# Patient Record
Sex: Female | Born: 1945 | Race: White | Hispanic: No | Marital: Single | Smoking: Never smoker
Health system: Southern US, Community
[De-identification: ages and names within clinical notes are randomized; demographics above are authoritative.]

## PROBLEM LIST (undated history)

## (undated) DIAGNOSIS — K635 Polyp of colon: Secondary | ICD-10-CM

## (undated) DIAGNOSIS — E785 Hyperlipidemia, unspecified: Secondary | ICD-10-CM

## (undated) DIAGNOSIS — F419 Anxiety disorder, unspecified: Secondary | ICD-10-CM

## (undated) DIAGNOSIS — T7840XA Allergy, unspecified, initial encounter: Secondary | ICD-10-CM

## (undated) DIAGNOSIS — J189 Pneumonia, unspecified organism: Secondary | ICD-10-CM

## (undated) DIAGNOSIS — Z87442 Personal history of urinary calculi: Secondary | ICD-10-CM

## (undated) DIAGNOSIS — T8859XA Other complications of anesthesia, initial encounter: Secondary | ICD-10-CM

## (undated) DIAGNOSIS — I10 Essential (primary) hypertension: Secondary | ICD-10-CM

## (undated) DIAGNOSIS — J45909 Unspecified asthma, uncomplicated: Secondary | ICD-10-CM

## (undated) DIAGNOSIS — M1990 Unspecified osteoarthritis, unspecified site: Secondary | ICD-10-CM

## (undated) DIAGNOSIS — T4145XA Adverse effect of unspecified anesthetic, initial encounter: Secondary | ICD-10-CM

## (undated) DIAGNOSIS — Z96611 Presence of right artificial shoulder joint: Secondary | ICD-10-CM

## (undated) HISTORY — DX: Allergy, unspecified, initial encounter: T78.40XA

## (undated) HISTORY — DX: Polyp of colon: K63.5

## (undated) HISTORY — DX: Presence of right artificial shoulder joint: Z96.611

## (undated) HISTORY — PX: CATARACT EXTRACTION: SUR2

## (undated) HISTORY — DX: Hyperlipidemia, unspecified: E78.5

## (undated) HISTORY — PX: EYE SURGERY: SHX253

## (undated) HISTORY — PX: TONSILLECTOMY: SUR1361

## (undated) HISTORY — PX: KIDNEY STONE SURGERY: SHX686

---

## 2012-08-25 ENCOUNTER — Other Ambulatory Visit: Payer: Self-pay | Admitting: Family Medicine

## 2012-08-25 DIAGNOSIS — Z1231 Encounter for screening mammogram for malignant neoplasm of breast: Secondary | ICD-10-CM

## 2012-09-19 ENCOUNTER — Ambulatory Visit: Payer: Self-pay

## 2012-10-15 ENCOUNTER — Ambulatory Visit
Admission: RE | Admit: 2012-10-15 | Discharge: 2012-10-15 | Disposition: A | Discharge status: Home / Self Care | Payer: Medicare Other | Source: Ambulatory Visit | Attending: Family Medicine | Admitting: Family Medicine

## 2012-10-15 DIAGNOSIS — Z1231 Encounter for screening mammogram for malignant neoplasm of breast: Secondary | ICD-10-CM

## 2013-10-16 ENCOUNTER — Other Ambulatory Visit: Payer: Self-pay

## 2013-10-16 ENCOUNTER — Other Ambulatory Visit: Payer: Self-pay | Admitting: Family Medicine

## 2013-10-16 DIAGNOSIS — E2839 Other primary ovarian failure: Secondary | ICD-10-CM

## 2013-10-16 DIAGNOSIS — Z1231 Encounter for screening mammogram for malignant neoplasm of breast: Secondary | ICD-10-CM

## 2013-11-05 ENCOUNTER — Ambulatory Visit
Admission: RE | Admit: 2013-11-05 | Discharge: 2013-11-05 | Disposition: A | Discharge status: Home / Self Care | Payer: Medicare Other | Source: Ambulatory Visit

## 2013-11-05 ENCOUNTER — Ambulatory Visit
Admission: RE | Admit: 2013-11-05 | Discharge: 2013-11-05 | Disposition: A | Discharge status: Home / Self Care | Payer: Medicare Other | Source: Ambulatory Visit | Attending: Family Medicine | Admitting: Family Medicine

## 2013-11-05 DIAGNOSIS — E2839 Other primary ovarian failure: Secondary | ICD-10-CM

## 2013-11-05 DIAGNOSIS — Z1231 Encounter for screening mammogram for malignant neoplasm of breast: Secondary | ICD-10-CM

## 2013-12-02 ENCOUNTER — Encounter: Payer: Self-pay | Admitting: Family Medicine

## 2013-12-02 LAB — HM COLONOSCOPY

## 2014-11-02 ENCOUNTER — Encounter: Payer: Self-pay | Admitting: *Deleted

## 2014-11-03 ENCOUNTER — Ambulatory Visit (INDEPENDENT_AMBULATORY_CARE_PROVIDER_SITE_OTHER): Payer: Medicare Other | Admitting: *Deleted

## 2014-11-03 DIAGNOSIS — I8393 Asymptomatic varicose veins of bilateral lower extremities: Secondary | ICD-10-CM

## 2014-11-03 DIAGNOSIS — I781 Nevus, non-neoplastic: Secondary | ICD-10-CM

## 2014-11-03 NOTE — Progress Notes (Signed)
X=.3% Sotradecol administered with a 27g butterfly.  Patient received a total of 6cc.  Treated all her areas of concern with one syringe. Easy access and tol well. Anticipate good results. Follow prn.  Photos: No.  Compression stockings applied: Yes.

## 2014-11-04 ENCOUNTER — Encounter: Payer: Self-pay | Admitting: *Deleted

## 2014-11-16 ENCOUNTER — Other Ambulatory Visit: Payer: Self-pay

## 2014-11-16 DIAGNOSIS — Z1231 Encounter for screening mammogram for malignant neoplasm of breast: Secondary | ICD-10-CM

## 2014-12-14 ENCOUNTER — Ambulatory Visit: Payer: Medicare Other

## 2014-12-15 ENCOUNTER — Ambulatory Visit
Admission: RE | Admit: 2014-12-15 | Discharge: 2014-12-15 | Disposition: A | Discharge status: Home / Self Care | Payer: Medicare Other | Source: Ambulatory Visit

## 2014-12-15 DIAGNOSIS — Z1231 Encounter for screening mammogram for malignant neoplasm of breast: Secondary | ICD-10-CM

## 2015-11-14 ENCOUNTER — Other Ambulatory Visit: Payer: Self-pay | Admitting: Family Medicine

## 2015-11-14 DIAGNOSIS — Z1231 Encounter for screening mammogram for malignant neoplasm of breast: Secondary | ICD-10-CM

## 2015-11-14 DIAGNOSIS — M8580 Other specified disorders of bone density and structure, unspecified site: Secondary | ICD-10-CM

## 2016-01-09 ENCOUNTER — Ambulatory Visit
Admission: RE | Admit: 2016-01-09 | Discharge: 2016-01-09 | Disposition: A | Discharge status: Home / Self Care | Payer: Medicare Other | Source: Ambulatory Visit | Attending: Family Medicine | Admitting: Family Medicine

## 2016-01-09 DIAGNOSIS — Z1231 Encounter for screening mammogram for malignant neoplasm of breast: Secondary | ICD-10-CM

## 2016-01-09 DIAGNOSIS — M8580 Other specified disorders of bone density and structure, unspecified site: Secondary | ICD-10-CM

## 2016-12-18 ENCOUNTER — Other Ambulatory Visit: Payer: Self-pay | Admitting: Family Medicine

## 2016-12-18 DIAGNOSIS — Z1231 Encounter for screening mammogram for malignant neoplasm of breast: Secondary | ICD-10-CM

## 2017-02-08 ENCOUNTER — Ambulatory Visit: Payer: Medicare Other

## 2017-02-14 ENCOUNTER — Ambulatory Visit
Admission: RE | Admit: 2017-02-14 | Discharge: 2017-02-14 | Disposition: A | Discharge status: Home / Self Care | Payer: Medicare Other | Source: Ambulatory Visit | Attending: Family Medicine | Admitting: Family Medicine

## 2017-02-14 DIAGNOSIS — Z1231 Encounter for screening mammogram for malignant neoplasm of breast: Secondary | ICD-10-CM

## 2017-03-28 ENCOUNTER — Other Ambulatory Visit: Payer: Self-pay | Admitting: Orthopedic Surgery

## 2017-03-28 DIAGNOSIS — M19012 Primary osteoarthritis, left shoulder: Secondary | ICD-10-CM

## 2017-04-09 LAB — HM PAP SMEAR: HM PAP: NEGATIVE

## 2017-04-10 ENCOUNTER — Other Ambulatory Visit: Payer: Self-pay | Admitting: Orthopedic Surgery

## 2017-04-10 ENCOUNTER — Ambulatory Visit
Admission: RE | Admit: 2017-04-10 | Discharge: 2017-04-10 | Disposition: A | Discharge status: Home / Self Care | Payer: Medicare Other | Source: Ambulatory Visit | Attending: Orthopedic Surgery | Admitting: Orthopedic Surgery

## 2017-04-10 DIAGNOSIS — M19012 Primary osteoarthritis, left shoulder: Secondary | ICD-10-CM

## 2017-05-15 ENCOUNTER — Other Ambulatory Visit: Payer: Self-pay | Admitting: Orthopedic Surgery

## 2017-05-28 LAB — LIPID PANEL
CHOLESTEROL: 252 — AB (ref 0–200)
HDL: 72 — AB (ref 35–70)
LDL Cholesterol: 145
Triglycerides: 174 — AB (ref 40–160)

## 2017-05-28 LAB — CBC AND DIFFERENTIAL
NEUTROS ABS: 5
WBC: 7.5

## 2017-05-28 LAB — BASIC METABOLIC PANEL
BUN: 18 (ref 4–21)
Creatinine: 0.6 (ref 0.5–1.1)
Glucose: 97
Sodium: 137 (ref 137–147)

## 2017-05-28 LAB — HEPATIC FUNCTION PANEL: BILIRUBIN, TOTAL: 0.6

## 2017-06-07 ENCOUNTER — Other Ambulatory Visit: Payer: Self-pay | Admitting: Cardiology

## 2017-06-07 DIAGNOSIS — I700 Atherosclerosis of aorta: Secondary | ICD-10-CM

## 2017-06-10 NOTE — Pre-Procedure Instructions (Signed)
Ashley Dyer  06/10/2017      [id number] - [place], [place] - [address] [place] [place] [address] Phone: [phone] Fax: [phone]    Your procedure is scheduled on Thursday 06/13/17  Report to [facility] Admitting at 530 A.M.  Call this number if you have problems the morning of surgery:  [phone]   Remember:  Do not eat food or drink liquids after midnight.  Take these medicines the morning of surgery with A SIP OF WATER - ALBUTEROL INHALER IF NEEDED  7 days prior to surgery STOP taking any Aspirin (unless otherwise instructed by your surgeon), Aleve, Naproxen, Ibuprofen, Motrin, Advil, Goody's, BC's, all herbal medications, fish oil, and all vitamins, TURMERIC   Do not wear jewelry, make-up or nail polish.  Do not wear lotions, powders, or perfumes, or deoderant.  Do not shave 48 hours prior to surgery.  Men may shave face and neck.  Do not bring valuables to the hospital.  [facility] is not responsible for any belongings or valuables.  Contacts, dentures or bridgework may not be worn into surgery.  Leave your suitcase in the car.  After surgery it may be brought to your room.  For patients admitted to the hospital, discharge time will be determined by your treatment team.  Patients discharged the day of surgery will not be allowed to drive home.   Name and phone number of your driver:    Special instructions:  [place] - Preparing for Surgery  Before surgery, you can play an important role.  Because skin is not sterile, your skin needs to be as free of germs as possible.  You can reduce the number of germs on you skin by washing with CHG (chlorahexidine gluconate) soap before surgery.  CHG is an antiseptic cleaner which kills germs and bonds with the skin to continue killing germs even after washing.  Please DO NOT use if you have an allergy to CHG or antibacterial soaps.  If your skin becomes reddened/irritated  stop using the CHG and inform your nurse when you arrive at Short Stay.  Do not shave (including legs and underarms) for at least 48 hours prior to the first CHG shower.  You may shave your face.  Please follow these instructions carefully:   1.  Shower with CHG Soap the night before surgery and the                                morning of Surgery.  2.  If you choose to wash your hair, wash your hair first as usual with your       normal shampoo.  3.  After you shampoo, rinse your hair and body thoroughly to remove the                      Shampoo.  4.  Use CHG as you would any other liquid soap.  You can apply chg directly       to the skin and wash gently with scrungie or a clean washcloth.  5.  Apply the CHG Soap to your body ONLY FROM THE NECK DOWN.        Do not use on open wounds or open sores.  Avoid contact with your eyes,       ears, mouth and genitals (private parts).  Wash genitals (private parts)  with your normal soap.  6.  Wash thoroughly, paying special attention to the area where your surgery        will be performed.  7.  Thoroughly rinse your body with warm water from the neck down.  8.  DO NOT shower/wash with your normal soap after using and rinsing off       the CHG Soap.  9.  Pat yourself dry with a clean towel.            10.  Wear clean pajamas.            11.  Place clean sheets on your bed the night of your first shower and do not        sleep with pets.  Day of Surgery  Do not apply any lotions/deoderants the morning of surgery.  Please wear clean clothes to the hospital/surgery center.    Please read over the following fact sheets that you were given. MRSA Information and Surgical Site Infection Prevention

## 2017-06-11 ENCOUNTER — Inpatient Hospital Stay (HOSPITAL_COMMUNITY)
Admission: RE | Admit: 2017-06-11 | Discharge: 2017-06-11 | Disposition: A | Discharge status: Home / Self Care | Payer: Medicare Other | Source: Ambulatory Visit

## 2017-06-12 ENCOUNTER — Other Ambulatory Visit: Payer: Self-pay

## 2017-06-12 ENCOUNTER — Encounter (HOSPITAL_COMMUNITY)
Admission: RE | Admit: 2017-06-12 | Discharge: 2017-06-12 | Disposition: A | Discharge status: Home / Self Care | Payer: Medicare Other | Source: Ambulatory Visit | Attending: Orthopedic Surgery | Admitting: Orthopedic Surgery

## 2017-06-12 ENCOUNTER — Encounter (HOSPITAL_COMMUNITY): Payer: Self-pay

## 2017-06-12 DIAGNOSIS — Z01818 Encounter for other preprocedural examination: Secondary | ICD-10-CM

## 2017-06-12 HISTORY — DX: Unspecified asthma, uncomplicated: J45.909

## 2017-06-12 HISTORY — DX: Personal history of urinary calculi: Z87.442

## 2017-06-12 HISTORY — DX: Adverse effect of unspecified anesthetic, initial encounter: T41.45XA

## 2017-06-12 HISTORY — DX: Anxiety disorder, unspecified: F41.9

## 2017-06-12 HISTORY — DX: Unspecified osteoarthritis, unspecified site: M19.90

## 2017-06-12 HISTORY — DX: Essential (primary) hypertension: I10

## 2017-06-12 HISTORY — DX: Other complications of anesthesia, initial encounter: T88.59XA

## 2017-06-12 LAB — COMPREHENSIVE METABOLIC PANEL
ALK PHOS: 84 U/L (ref 38–126)
ALT: 24 U/L (ref 14–54)
ANION GAP: 11 (ref 5–15)
AST: 30 U/L (ref 15–41)
Albumin: 4.3 g/dL (ref 3.5–5.0)
BUN: 17 mg/dL (ref 6–20)
CALCIUM: 9.6 mg/dL (ref 8.9–10.3)
CO2: 25 mmol/L (ref 22–32)
CREATININE: 0.67 mg/dL (ref 0.44–1.00)
Chloride: 101 mmol/L (ref 101–111)
Glucose, Bld: 86 mg/dL (ref 65–99)
Potassium: 3 mmol/L — ABNORMAL LOW (ref 3.5–5.1)
SODIUM: 137 mmol/L (ref 135–145)
TOTAL PROTEIN: 8.3 g/dL — AB (ref 6.5–8.1)
Total Bilirubin: 0.5 mg/dL (ref 0.3–1.2)

## 2017-06-12 LAB — URINALYSIS, ROUTINE W REFLEX MICROSCOPIC
BACTERIA UA: NONE SEEN
Bilirubin Urine: NEGATIVE
Glucose, UA: NEGATIVE mg/dL
Ketones, ur: NEGATIVE mg/dL
Leukocytes, UA: NEGATIVE
Nitrite: NEGATIVE
PROTEIN: NEGATIVE mg/dL
SPECIFIC GRAVITY, URINE: 1.005 (ref 1.005–1.030)
SQUAMOUS EPITHELIAL / LPF: NONE SEEN
pH: 8 (ref 5.0–8.0)

## 2017-06-12 LAB — CBC WITH DIFFERENTIAL/PLATELET
Basophils Absolute: 0 10*3/uL (ref 0.0–0.1)
Basophils Relative: 0 %
EOS ABS: 0 10*3/uL (ref 0.0–0.7)
EOS PCT: 0 %
HCT: 42.2 % (ref 36.0–46.0)
HEMOGLOBIN: 14.4 g/dL (ref 12.0–15.0)
LYMPHS ABS: 1.8 10*3/uL (ref 0.7–4.0)
LYMPHS PCT: 12 %
MCH: 31.2 pg (ref 26.0–34.0)
MCHC: 34.1 g/dL (ref 30.0–36.0)
MCV: 91.5 fL (ref 78.0–100.0)
MONOS PCT: 8 %
Monocytes Absolute: 1.2 10*3/uL — ABNORMAL HIGH (ref 0.1–1.0)
NEUTROS PCT: 80 %
Neutro Abs: 11.5 10*3/uL — ABNORMAL HIGH (ref 1.7–7.7)
Platelets: 352 10*3/uL (ref 150–400)
RBC: 4.61 MIL/uL (ref 3.87–5.11)
RDW: 13.2 % (ref 11.5–15.5)
WBC: 14.5 10*3/uL — ABNORMAL HIGH (ref 4.0–10.5)

## 2017-06-12 LAB — PROTIME-INR
INR: 0.96
PROTHROMBIN TIME: 12.7 s (ref 11.4–15.2)

## 2017-06-12 LAB — TYPE AND SCREEN
ABO/RH(D): O POS
Antibody Screen: NEGATIVE

## 2017-06-12 LAB — ABO/RH: ABO/RH(D): O POS

## 2017-06-12 LAB — SURGICAL PCR SCREEN
MRSA, PCR: NEGATIVE
STAPHYLOCOCCUS AUREUS: NEGATIVE

## 2017-06-12 LAB — APTT: aPTT: 33 seconds (ref 24–36)

## 2017-06-12 NOTE — Pre-Procedure Instructions (Signed)
ADORIA KAWAMOTO  06/12/2017      [facility] - [name], [place] - [address] [name]  [address] Phone: [phone] Fax: [phone]    Your procedure is scheduled on Thursday 06/13/17  Report to [facility] Admitting at 530 A.M.  Call this number if you have problems the morning of surgery:  [phone]   Remember:  Do not eat food or drink liquids after midnight.  Take these medicines the morning of surgery with A SIP OF WATER - ALBUTEROL INHALER IF NEEDED, use Flovent inhaler, Nasalcrom, Prednisone & Azithromycin  7 days prior to surgery STOP taking any Aspirin (unless otherwise instructed by your surgeon), Aleve, Naproxen, Ibuprofen, Motrin, Advil, Goody's, BC's, all herbal medications, fish oil, and all vitamins, TURMERIC   Do not wear jewelry, make-up or nail polish.   Do not wear lotions, powders, or perfumes, or deoderant.   Do not shave 48 hours prior to surgery.    Do not bring valuables to the hospital.   [facility] is not responsible for any belongings or valuables.  Contacts, dentures or bridgework may not be worn into surgery.  Leave your suitcase in the car.  After surgery it may be brought to your room.  For patients admitted to the hospital, discharge time will be determined by your treatment team.  Patients discharged the day of surgery will not be allowed to drive home.   Name and phone number of your driver:    Special instructions:  [place] - Preparing for Surgery  Before surgery, you can play an important role.  Because skin is not sterile, your skin needs to be as free of germs as possible.  You can reduce the number of germs on you skin by washing with CHG (chlorahexidine gluconate) soap before surgery.  CHG is an antiseptic cleaner which kills germs and bonds with the skin to continue killing germs even after washing.  Please DO NOT use if you have an allergy to CHG or antibacterial soaps.   If your skin becomes reddened/irritated stop using the CHG and inform your nurse when you arrive at Short Stay.  Do not shave (including legs and underarms) for at least 48 hours prior to the first CHG shower.  You may shave your face.  Please follow these instructions carefully:   1.  Shower with CHG Soap the night before surgery and the                                morning of Surgery.  2.  If you choose to wash your hair, wash your hair first as usual with your       normal shampoo.  3.  After you shampoo, rinse your hair and body thoroughly to remove the                      Shampoo.  4.  Use CHG as you would any other liquid soap.  You can apply chg directly       to the skin and wash gently with scrungie or a clean washcloth.  5.  Apply the CHG Soap to your body ONLY FROM THE NECK DOWN.        Do not use on open wounds or open sores.  Avoid contact with your eyes,       ears, mouth and genitals (private parts).  Wash genitals (private parts)       with your normal soap.  6.  Wash thoroughly, paying special attention to the area where your surgery        will be performed.  7.  Thoroughly rinse your body with warm water from the neck down.  8.  DO NOT shower/wash with your normal soap after using and rinsing off       the CHG Soap.  9.  Pat yourself dry with a clean towel.            10.  Wear clean pajamas.            11.  Place clean sheets on your bed the night of your first shower and do not        sleep with pets.  Day of Surgery  Do not apply any lotions/deoderants the morning of surgery.  Please wear clean clothes to the hospital/surgery center.    Please read over the following fact sheets that you were given. MRSA Information and Surgical Site Infection Prevention

## 2017-06-12 NOTE — Pre-Procedure Instructions (Signed)
Ashley Dyer  06/12/2017      [facility] - [place], [name] - [address] [place] [address] Phone: [phone] Fax: [phone]    Your procedure is scheduled on Thursday 06/13/17  Report to [facility] Admitting at 530 A.M.  Call this number if you have problems the morning of surgery:  [place] with you the day of surgery   Remember:  Do not eat food or drink liquids after midnight.  Take these medicines the morning of surgery with A SIP OF WATER - ALBUTEROL INHALER IF NEEDED, use Flovent inhaler, Nasalcrom, Prednisone & Azithromycin  7 days prior to surgery STOP taking any Aspirin (unless otherwise instructed by your surgeon), Aleve, Naproxen, Ibuprofen, Motrin, Advil, Goody's, BC's, all herbal medications, fish oil, and all vitamins, TURMERIC   Do not wear jewelry, make-up or nail polish.   Do not wear lotions, powders, or perfumes, or deoderant.   Do not shave 48 hours prior to surgery.    Do not bring valuables to the hospital.   [facility] is not responsible for any belongings or valuables.  Contacts, dentures or bridgework may not be worn into surgery.  Leave your suitcase in the car.  After surgery it may be brought to your room.  For patients admitted to the hospital, discharge time will be determined by your treatment team.  Patients discharged the day of surgery will not be allowed to drive home.   Name and phone number of your driver:    Special instructions:  [place] - Preparing for Surgery  Before surgery, you can play an important role.  Because skin is not sterile, your skin needs to be as free of germs as possible.  You can reduce the number of germs on you skin by washing with CHG (chlorahexidine gluconate) soap before surgery.  CHG is an antiseptic cleaner which kills germs and bonds with the skin to continue killing germs even after washing.  Please DO NOT  use if you have an allergy to CHG or antibacterial soaps.  If your skin becomes reddened/irritated stop using the CHG and inform your nurse when you arrive at Short Stay.  Do not shave (including legs and underarms) for at least 48 hours prior to the first CHG shower.  You may shave your face.  Please follow these instructions carefully:   1.  Shower with CHG Soap the night before surgery and the                                morning of Surgery.  2.  If you choose to wash your hair, wash your hair first as usual with your       normal shampoo.  3.  After you shampoo, rinse your hair and body thoroughly to remove the                      Shampoo.  4.  Use CHG as you would any other liquid soap.  You can apply chg directly       to the skin and wash gently with scrungie or a clean washcloth.  5.  Apply the CHG Soap to your body ONLY FROM THE NECK DOWN.        Do not use on open wounds or open sores.  Avoid  contact with your eyes,       ears, mouth and genitals (private parts).  Wash genitals (private parts)       with your normal soap.  6.  Wash thoroughly, paying special attention to the area where your surgery        will be performed.  7.  Thoroughly rinse your body with warm water from the neck down.  8.  DO NOT shower/wash with your normal soap after using and rinsing off       the CHG Soap.  9.  Pat yourself dry with a clean towel.            10.  Wear clean pajamas.            11.  Place clean sheets on your bed the night of your first shower and do not        sleep with pets.  Day of Surgery  Do not apply any lotions/deoderants the morning of surgery.  Please wear clean clothes to the hospital/surgery center.    Please read over the following fact sheets that you were given. MRSA Information and Surgical Site Infection Prevention

## 2017-06-12 NOTE — Progress Notes (Signed)
Pt. Recently seen by Dr. Wynonia Lawman for evaluation after PCP- Edwin Dada was questioning something on her EKG.  Requesting records from Dr. Wynonia Lawman. Pt. Remarks that a cardiac scan will be done after she has the shoulder surgery. Pt. Recently & currently being treated for bronchitis, using Zpak & Pred. Pak.

## 2017-06-13 ENCOUNTER — Inpatient Hospital Stay (HOSPITAL_COMMUNITY): Payer: Medicare Other

## 2017-06-13 ENCOUNTER — Inpatient Hospital Stay (HOSPITAL_COMMUNITY)
Admission: RE | Admit: 2017-06-13 | Discharge: 2017-06-14 | DRG: 483 | Disposition: A | Discharge status: Home / Self Care | Payer: Medicare Other | Source: Ambulatory Visit | Attending: Orthopedic Surgery | Admitting: Orthopedic Surgery

## 2017-06-13 ENCOUNTER — Encounter (HOSPITAL_COMMUNITY)
Admission: RE | Disposition: A | Discharge status: Home / Self Care | Payer: Self-pay | Source: Ambulatory Visit | Attending: Orthopedic Surgery

## 2017-06-13 ENCOUNTER — Other Ambulatory Visit: Payer: Self-pay

## 2017-06-13 ENCOUNTER — Inpatient Hospital Stay (HOSPITAL_COMMUNITY): Payer: Medicare Other | Admitting: Certified Registered Nurse Anesthetist

## 2017-06-13 ENCOUNTER — Encounter (HOSPITAL_COMMUNITY): Payer: Self-pay | Admitting: *Deleted

## 2017-06-13 DIAGNOSIS — M19042 Primary osteoarthritis, left hand: Secondary | ICD-10-CM | POA: Diagnosis present

## 2017-06-13 DIAGNOSIS — I10 Essential (primary) hypertension: Secondary | ICD-10-CM | POA: Diagnosis present

## 2017-06-13 DIAGNOSIS — Z96611 Presence of right artificial shoulder joint: Secondary | ICD-10-CM

## 2017-06-13 DIAGNOSIS — Z87891 Personal history of nicotine dependence: Secondary | ICD-10-CM

## 2017-06-13 DIAGNOSIS — Z7952 Long term (current) use of systemic steroids: Secondary | ICD-10-CM | POA: Diagnosis not present

## 2017-06-13 DIAGNOSIS — J45909 Unspecified asthma, uncomplicated: Secondary | ICD-10-CM | POA: Diagnosis present

## 2017-06-13 DIAGNOSIS — M19041 Primary osteoarthritis, right hand: Secondary | ICD-10-CM | POA: Diagnosis present

## 2017-06-13 DIAGNOSIS — Z87442 Personal history of urinary calculi: Secondary | ICD-10-CM

## 2017-06-13 DIAGNOSIS — Z7951 Long term (current) use of inhaled steroids: Secondary | ICD-10-CM

## 2017-06-13 DIAGNOSIS — M19011 Primary osteoarthritis, right shoulder: Secondary | ICD-10-CM | POA: Diagnosis present

## 2017-06-13 DIAGNOSIS — M19012 Primary osteoarthritis, left shoulder: Secondary | ICD-10-CM | POA: Diagnosis present

## 2017-06-13 DIAGNOSIS — J439 Emphysema, unspecified: Secondary | ICD-10-CM | POA: Diagnosis present

## 2017-06-13 DIAGNOSIS — I700 Atherosclerosis of aorta: Secondary | ICD-10-CM | POA: Diagnosis present

## 2017-06-13 HISTORY — PX: TOTAL SHOULDER ARTHROPLASTY: SHX126

## 2017-06-13 HISTORY — DX: Presence of right artificial shoulder joint: Z96.611

## 2017-06-13 SURGERY — ARTHROPLASTY, SHOULDER, TOTAL
Anesthesia: General | Laterality: Right

## 2017-06-13 MED ORDER — ONDANSETRON HCL 4 MG/2ML IJ SOLN
INTRAMUSCULAR | Status: AC
  Filled 2017-06-13: qty 2

## 2017-06-13 MED ORDER — CEFAZOLIN SODIUM-DEXTROSE 1-4 GM/50ML-% IV SOLN
1.0000 g | Freq: Four times a day (QID) | INTRAVENOUS | Status: AC
  Administered 2017-06-13 – 2017-06-14 (×3): 1 g via INTRAVENOUS
  Filled 2017-06-13 (×3): qty 50

## 2017-06-13 MED ORDER — ZOLPIDEM TARTRATE 5 MG PO TABS: 5.0000 mg | ORAL_TABLET | Freq: Every evening | ORAL | Status: DC | PRN

## 2017-06-13 MED ORDER — LIDOCAINE 2% (20 MG/ML) 5 ML SYRINGE
INTRAMUSCULAR | Status: DC | PRN
  Administered 2017-06-13: 80 mg via INTRAVENOUS

## 2017-06-13 MED ORDER — BUDESONIDE 0.25 MG/2ML IN SUSP
0.2500 mg | Freq: Two times a day (BID) | RESPIRATORY_TRACT | Status: DC
  Filled 2017-06-13 (×2): qty 2

## 2017-06-13 MED ORDER — LIDOCAINE HCL (CARDIAC) 20 MG/ML IV SOLN
INTRAVENOUS | Status: DC | PRN
  Administered 2017-06-13: 80 mg via INTRAVENOUS

## 2017-06-13 MED ORDER — ACETAMINOPHEN 650 MG RE SUPP
650.0000 mg | RECTAL | Status: DC | PRN
Start: 2017-06-13 — End: 2017-06-14

## 2017-06-13 MED ORDER — CEFAZOLIN SODIUM-DEXTROSE 2-4 GM/100ML-% IV SOLN
2.0000 g | INTRAVENOUS | Status: AC
  Administered 2017-06-13: 2 g via INTRAVENOUS
  Filled 2017-06-13: qty 100

## 2017-06-13 MED ORDER — BUPIVACAINE LIPOSOME 1.3 % IJ SUSP
INTRAMUSCULAR | Status: DC | PRN
Start: 2017-06-13 — End: 2017-06-13
  Administered 2017-06-13: 20 mL

## 2017-06-13 MED ORDER — TRIAMTERENE-HCTZ 75-50 MG PO TABS
1.0000 | ORAL_TABLET | ORAL | Status: DC
  Administered 2017-06-14: 1 via ORAL
  Filled 2017-06-13: qty 1

## 2017-06-13 MED ORDER — METHOCARBAMOL 500 MG PO TABS
500.0000 mg | ORAL_TABLET | Freq: Four times a day (QID) | ORAL | Status: DC | PRN
Start: 2017-06-13 — End: 2017-06-14
  Filled 2017-06-13: qty 1

## 2017-06-13 MED ORDER — ONDANSETRON HCL 4 MG PO TABS: 4.0000 mg | ORAL_TABLET | Freq: Four times a day (QID) | ORAL | Status: DC | PRN

## 2017-06-13 MED ORDER — SODIUM CHLORIDE 0.9% FLUSH
INTRAVENOUS | Status: DC | PRN
  Administered 2017-06-13: 20 mL

## 2017-06-13 MED ORDER — MORPHINE SULFATE (PF) 2 MG/ML IV SOLN: 1.0000 mg | INTRAVENOUS | Status: DC | PRN

## 2017-06-13 MED ORDER — CO Q-10 200 MG PO CAPS: 300.0000 mg | ORAL_CAPSULE | Freq: Every day | ORAL | Status: DC

## 2017-06-13 MED ORDER — DEXAMETHASONE SODIUM PHOSPHATE 10 MG/ML IJ SOLN
INTRAMUSCULAR | Status: DC | PRN
  Administered 2017-06-13: 10 mg via INTRAVENOUS

## 2017-06-13 MED ORDER — ROCURONIUM BROMIDE 10 MG/ML (PF) SYRINGE
PREFILLED_SYRINGE | INTRAVENOUS | Status: AC
  Filled 2017-06-13: qty 5

## 2017-06-13 MED ORDER — ALBUTEROL SULFATE (2.5 MG/3ML) 0.083% IN NEBU: 2.5000 mg | INHALATION_SOLUTION | Freq: Four times a day (QID) | RESPIRATORY_TRACT | Status: DC | PRN

## 2017-06-13 MED ORDER — OXYCODONE HCL 5 MG PO TABS
5.0000 mg | ORAL_TABLET | ORAL | Status: DC | PRN
  Administered 2017-06-14 (×2): 5 mg via ORAL
  Filled 2017-06-13 (×2): qty 1

## 2017-06-13 MED ORDER — OXYCODONE-ACETAMINOPHEN 5-325 MG PO TABS: 1.0000 | ORAL_TABLET | ORAL | 0 refills | Status: DC | PRN

## 2017-06-13 MED ORDER — POTASSIUM CHLORIDE 10 MEQ/100ML IV SOLN
10.0000 meq | INTRAVENOUS | Status: AC
  Administered 2017-06-13 (×2): 10 meq via INTRAVENOUS
  Filled 2017-06-13 (×2): qty 100

## 2017-06-13 MED ORDER — METHOCARBAMOL 750 MG PO TABS: 750.0000 mg | ORAL_TABLET | Freq: Three times a day (TID) | ORAL | 0 refills | Status: DC | PRN

## 2017-06-13 MED ORDER — POVIDONE-IODINE 7.5 % EX SOLN
Freq: Once | CUTANEOUS | Status: DC
  Administered 2017-06-13: 06:00:00 via TOPICAL
  Filled 2017-06-13: qty 118

## 2017-06-13 MED ORDER — SENNOSIDES-DOCUSATE SODIUM 8.6-50 MG PO TABS: 1.0000 | ORAL_TABLET | Freq: Every evening | ORAL | Status: DC | PRN

## 2017-06-13 MED ORDER — ALUMINUM HYDROXIDE GEL 320 MG/5ML PO SUSP
15.0000 mL | ORAL | Status: DC | PRN
  Filled 2017-06-13: qty 30

## 2017-06-13 MED ORDER — PHENOL 1.4 % MT LIQD: 1.0000 | OROMUCOSAL | Status: DC | PRN

## 2017-06-13 MED ORDER — DIPHENHYDRAMINE HCL 12.5 MG/5ML PO ELIX
12.5000 mg | ORAL_SOLUTION | ORAL | Status: DC | PRN
Start: 2017-06-13 — End: 2017-06-14

## 2017-06-13 MED ORDER — METOCLOPRAMIDE HCL 5 MG/ML IJ SOLN: 5.0000 mg | Freq: Three times a day (TID) | INTRAMUSCULAR | Status: DC | PRN

## 2017-06-13 MED ORDER — SODIUM CHLORIDE 0.9 % IR SOLN
Status: DC | PRN
  Administered 2017-06-13: 1000 mL

## 2017-06-13 MED ORDER — MIDAZOLAM HCL 5 MG/5ML IJ SOLN
INTRAMUSCULAR | Status: DC | PRN
  Administered 2017-06-13: 2 mg via INTRAVENOUS

## 2017-06-13 MED ORDER — NEOSTIGMINE METHYLSULFATE 5 MG/5ML IV SOSY
PREFILLED_SYRINGE | INTRAVENOUS | Status: AC
  Filled 2017-06-13: qty 5

## 2017-06-13 MED ORDER — TRIAMTERENE-HCTZ 75-50 MG PO TABS
0.5000 | ORAL_TABLET | ORAL | Status: DC
  Administered 2017-06-13: 0.5 via ORAL
  Filled 2017-06-13: qty 0.5

## 2017-06-13 MED ORDER — LIDOCAINE 2% (20 MG/ML) 5 ML SYRINGE
INTRAMUSCULAR | Status: AC
  Filled 2017-06-13: qty 5

## 2017-06-13 MED ORDER — DOCUSATE SODIUM 100 MG PO CAPS
100.0000 mg | ORAL_CAPSULE | Freq: Two times a day (BID) | ORAL | Status: DC
  Administered 2017-06-13 – 2017-06-14 (×3): 100 mg via ORAL
  Filled 2017-06-13 (×3): qty 1

## 2017-06-13 MED ORDER — ACETAMINOPHEN 325 MG PO TABS: 650.0000 mg | ORAL_TABLET | ORAL | Status: DC | PRN

## 2017-06-13 MED ORDER — SUGAMMADEX SODIUM 200 MG/2ML IV SOLN
INTRAVENOUS | Status: DC | PRN
  Administered 2017-06-13: 200 mg via INTRAVENOUS

## 2017-06-13 MED ORDER — TRANEXAMIC ACID 1000 MG/10ML IV SOLN
1000.0000 mg | INTRAVENOUS | Status: AC
  Administered 2017-06-13: 1000 mg via INTRAVENOUS
  Filled 2017-06-13: qty 10

## 2017-06-13 MED ORDER — SUGAMMADEX SODIUM 500 MG/5ML IV SOLN
INTRAVENOUS | Status: AC
  Filled 2017-06-13: qty 5

## 2017-06-13 MED ORDER — MENTHOL 3 MG MT LOZG: 1.0000 | LOZENGE | OROMUCOSAL | Status: DC | PRN

## 2017-06-13 MED ORDER — PREDNISONE 10 MG PO TABS
10.0000 mg | ORAL_TABLET | Freq: Two times a day (BID) | ORAL | Status: DC
  Administered 2017-06-14: 10 mg via ORAL
  Filled 2017-06-13: qty 1

## 2017-06-13 MED ORDER — BISACODYL 5 MG PO TBEC: 5.0000 mg | DELAYED_RELEASE_TABLET | Freq: Every day | ORAL | Status: DC | PRN

## 2017-06-13 MED ORDER — FENTANYL CITRATE (PF) 100 MCG/2ML IJ SOLN
INTRAMUSCULAR | Status: DC | PRN
  Administered 2017-06-13: 25 ug via INTRAVENOUS
  Administered 2017-06-13: 50 ug via INTRAVENOUS

## 2017-06-13 MED ORDER — METOCLOPRAMIDE HCL 5 MG PO TABS: 5.0000 mg | ORAL_TABLET | Freq: Three times a day (TID) | ORAL | Status: DC | PRN

## 2017-06-13 MED ORDER — OXYCODONE HCL 5 MG PO TABS
10.0000 mg | ORAL_TABLET | ORAL | Status: DC | PRN
  Administered 2017-06-13 – 2017-06-14 (×2): 10 mg via ORAL
  Filled 2017-06-13 (×2): qty 2

## 2017-06-13 MED ORDER — AZITHROMYCIN 250 MG PO TABS
250.0000 mg | ORAL_TABLET | Freq: Every day | ORAL | Status: AC
  Administered 2017-06-14: 250 mg via ORAL
  Filled 2017-06-13: qty 1

## 2017-06-13 MED ORDER — METHOCARBAMOL 1000 MG/10ML IJ SOLN
500.0000 mg | Freq: Four times a day (QID) | INTRAVENOUS | Status: DC | PRN
  Filled 2017-06-13: qty 5

## 2017-06-13 MED ORDER — ROSUVASTATIN CALCIUM 10 MG PO TABS
10.0000 mg | ORAL_TABLET | Freq: Every day | ORAL | Status: DC
  Administered 2017-06-14: 10 mg via ORAL
  Filled 2017-06-13 (×2): qty 1

## 2017-06-13 MED ORDER — DOCUSATE SODIUM 100 MG PO CAPS: 100.0000 mg | ORAL_CAPSULE | Freq: Three times a day (TID) | ORAL | 0 refills | Status: DC | PRN

## 2017-06-13 MED ORDER — FLEET ENEMA 7-19 GM/118ML RE ENEM: 1.0000 | ENEMA | Freq: Once | RECTAL | Status: DC | PRN

## 2017-06-13 MED ORDER — LACTATED RINGERS IV SOLN
INTRAVENOUS | Status: DC | PRN
  Administered 2017-06-13: 07:00:00 via INTRAVENOUS

## 2017-06-13 MED ORDER — ASPIRIN EC 325 MG PO TBEC
325.0000 mg | DELAYED_RELEASE_TABLET | Freq: Every day | ORAL | Status: DC
  Administered 2017-06-13 – 2017-06-14 (×2): 325 mg via ORAL
  Filled 2017-06-13 (×2): qty 1

## 2017-06-13 MED ORDER — ONDANSETRON HCL 4 MG/2ML IJ SOLN: 4.0000 mg | Freq: Four times a day (QID) | INTRAMUSCULAR | Status: DC | PRN

## 2017-06-13 MED ORDER — ONDANSETRON HCL 4 MG/2ML IJ SOLN
INTRAMUSCULAR | Status: DC | PRN
  Administered 2017-06-13: 4 mg via INTRAVENOUS

## 2017-06-13 MED ORDER — PHENYLEPHRINE HCL 10 MG/ML IJ SOLN
INTRAMUSCULAR | Status: DC | PRN
  Administered 2017-06-13: 20 ug/min via INTRAVENOUS

## 2017-06-13 MED ORDER — ACETAMINOPHEN 500 MG PO TABS
1000.0000 mg | ORAL_TABLET | Freq: Four times a day (QID) | ORAL | Status: AC
  Administered 2017-06-13 – 2017-06-14 (×4): 1000 mg via ORAL
  Filled 2017-06-13 (×4): qty 2

## 2017-06-13 MED ORDER — MIDAZOLAM HCL 2 MG/2ML IJ SOLN
INTRAMUSCULAR | Status: AC
  Filled 2017-06-13: qty 2

## 2017-06-13 MED ORDER — PROPOFOL 10 MG/ML IV BOLUS
INTRAVENOUS | Status: DC | PRN
  Administered 2017-06-13: 140 mg via INTRAVENOUS

## 2017-06-13 MED ORDER — SODIUM CHLORIDE 0.9 % IV SOLN
INTRAVENOUS | Status: DC
  Administered 2017-06-13: 17:00:00 via INTRAVENOUS

## 2017-06-13 MED ORDER — FENTANYL CITRATE (PF) 250 MCG/5ML IJ SOLN
INTRAMUSCULAR | Status: AC
Start: 2017-06-13 — End: 2017-06-13
  Filled 2017-06-13: qty 5

## 2017-06-13 MED ORDER — ROCURONIUM BROMIDE 100 MG/10ML IV SOLN
INTRAVENOUS | Status: DC | PRN
  Administered 2017-06-13: 10 mg via INTRAVENOUS
  Administered 2017-06-13: 50 mg via INTRAVENOUS

## 2017-06-13 MED ORDER — PROPOFOL 10 MG/ML IV BOLUS
INTRAVENOUS | Status: AC
  Filled 2017-06-13: qty 20

## 2017-06-13 MED ORDER — BUPIVACAINE LIPOSOME 1.3 % IJ SUSP
20.0000 mL | INTRAMUSCULAR | Status: DC
  Administered 2017-06-13: 266 mg
  Filled 2017-06-13: qty 20

## 2017-06-13 SURGICAL SUPPLY — 68 items
BIT DRILL 5/64X5 DISP (BIT) ×3 IMPLANT
BLADE SAW SAG 73X25 THK (BLADE) ×2
BLADE SAW SGTL 73X25 THK (BLADE) ×1 IMPLANT
BLADE SURG 15 STRL LF DISP TIS (BLADE) ×1 IMPLANT
BLADE SURG 15 STRL SS (BLADE) ×2
CAP SHOULDER TOTAL 2 ×3 IMPLANT
CEMENT BONE DEPUY (Cement) ×3 IMPLANT
CHLORAPREP W/TINT 26ML (MISCELLANEOUS) ×6 IMPLANT
CLOSURE WOUND 1/2 X4 (GAUZE/BANDAGES/DRESSINGS) ×1
COVER SURGICAL LIGHT HANDLE (MISCELLANEOUS) ×3 IMPLANT
DRAPE INCISE IOBAN 66X45 STRL (DRAPES) ×3 IMPLANT
DRAPE ORTHO SPLIT 77X108 STRL (DRAPES) ×4
DRAPE SURG 17X23 STRL (DRAPES) ×3 IMPLANT
DRAPE SURG ORHT 6 SPLT 77X108 (DRAPES) ×2 IMPLANT
DRAPE U-SHAPE 47X51 STRL (DRAPES) ×3 IMPLANT
DRSG AQUACEL AG ADV 3.5X10 (GAUZE/BANDAGES/DRESSINGS) IMPLANT
ELECT BLADE 4.0 EZ CLEAN MEGAD (MISCELLANEOUS)
ELECT REM PT RETURN 9FT ADLT (ELECTROSURGICAL) ×3
ELECTRODE BLDE 4.0 EZ CLN MEGD (MISCELLANEOUS) IMPLANT
ELECTRODE REM PT RTRN 9FT ADLT (ELECTROSURGICAL) ×1 IMPLANT
GLOVE BIO SURGEON STRL SZ7 (GLOVE) ×3 IMPLANT
GLOVE BIO SURGEON STRL SZ7.5 (GLOVE) ×3 IMPLANT
GLOVE BIOGEL PI IND STRL 7.0 (GLOVE) ×1 IMPLANT
GLOVE BIOGEL PI IND STRL 8 (GLOVE) ×1 IMPLANT
GLOVE BIOGEL PI INDICATOR 7.0 (GLOVE) ×2
GLOVE BIOGEL PI INDICATOR 8 (GLOVE) ×2
GOWN STRL REUS W/ TWL LRG LVL3 (GOWN DISPOSABLE) ×1 IMPLANT
GOWN STRL REUS W/ TWL XL LVL3 (GOWN DISPOSABLE) ×1 IMPLANT
GOWN STRL REUS W/TWL LRG LVL3 (GOWN DISPOSABLE) ×2
GOWN STRL REUS W/TWL XL LVL3 (GOWN DISPOSABLE) ×2
GUIDEWIRE GLENOID 2.5X220 (WIRE) IMPLANT
HANDPIECE INTERPULSE COAX TIP (DISPOSABLE) ×2
HEMOSTAT SURGICEL 2X14 (HEMOSTASIS) ×3 IMPLANT
HOOD PEEL AWAY FLYTE STAYCOOL (MISCELLANEOUS) ×9 IMPLANT
KIT BASIN OR (CUSTOM PROCEDURE TRAY) ×3 IMPLANT
KIT ROOM TURNOVER OR (KITS) ×3 IMPLANT
MANIFOLD NEPTUNE II (INSTRUMENTS) ×3 IMPLANT
NEEDLE MAYO TROCAR (NEEDLE) ×3 IMPLANT
NEEDLE SPNL 22GX3.5 QUINCKE BK (NEEDLE) ×3 IMPLANT
NS IRRIG 1000ML POUR BTL (IV SOLUTION) ×3 IMPLANT
PACK SHOULDER (CUSTOM PROCEDURE TRAY) ×3 IMPLANT
PAD ARMBOARD 7.5X6 YLW CONV (MISCELLANEOUS) ×6 IMPLANT
RESTRAINT HEAD UNIVERSAL NS (MISCELLANEOUS) ×3 IMPLANT
RETRIEVER SUT HEWSON (MISCELLANEOUS) ×3 IMPLANT
SET HNDPC FAN SPRY TIP SCT (DISPOSABLE) ×1 IMPLANT
SLING ARM FOAM STRAP LRG (SOFTGOODS) ×3 IMPLANT
SLING ARM FOAM STRAP MED (SOFTGOODS) IMPLANT
SMARTMIX MINI TOWER (MISCELLANEOUS) ×3
SPONGE LAP 18X18 X RAY DECT (DISPOSABLE) ×3 IMPLANT
SPONGE LAP 4X18 X RAY DECT (DISPOSABLE) IMPLANT
STRIP CLOSURE SKIN 1/2X4 (GAUZE/BANDAGES/DRESSINGS) ×2 IMPLANT
SUCTION FRAZIER HANDLE 10FR (MISCELLANEOUS) ×2
SUCTION TUBE FRAZIER 10FR DISP (MISCELLANEOUS) ×1 IMPLANT
SUPPORT WRAP ARM LG (MISCELLANEOUS) ×3 IMPLANT
SUT ETHIBOND NAB CT1 #1 30IN (SUTURE) ×6 IMPLANT
SUT FIBERWIRE #2 38 T-5 BLUE (SUTURE) ×6
SUT MNCRL AB 4-0 PS2 18 (SUTURE) ×3 IMPLANT
SUT VIC AB 0 CT1 27 (SUTURE) ×2
SUT VIC AB 0 CT1 27XBRD ANBCTR (SUTURE) ×1 IMPLANT
SUT VIC AB 2-0 CT1 27 (SUTURE) ×2
SUT VIC AB 2-0 CT1 TAPERPNT 27 (SUTURE) ×1 IMPLANT
SUTURE FIBERWR #2 38 T-5 BLUE (SUTURE) ×2 IMPLANT
SYR 30ML LL (SYRINGE) ×6 IMPLANT
TAPE LABRALWHITE 1.5X36 (TAPE) ×3 IMPLANT
TAPE STRIPS DRAPE STRL (GAUZE/BANDAGES/DRESSINGS) ×3 IMPLANT
TAPE SUT LABRALTAP WHT/BLK (SUTURE) ×3 IMPLANT
TOWEL OR 17X26 10 PK STRL BLUE (TOWEL DISPOSABLE) ×3 IMPLANT
TOWER SMARTMIX MINI (MISCELLANEOUS) ×1 IMPLANT

## 2017-06-13 NOTE — Anesthesia Preprocedure Evaluation (Addendum)
Anesthesia Evaluation  Patient identified by MRN, date of birth, ID band Patient awake    Reviewed: Allergy & Precautions, NPO status , Patient's Chart, lab work & pertinent test results  History of Anesthesia Complications (+) history of anesthetic complications  Airway Mallampati: I  TM Distance: >3 FB     Dental no notable dental hx.    Pulmonary asthma , former smoker,    breath sounds clear to auscultation       Cardiovascular hypertension,  Rhythm:Regular Rate:Normal     Neuro/Psych    GI/Hepatic negative GI ROS, Neg liver ROS,   Endo/Other  negative endocrine ROS  Renal/GU negative Renal ROS     Musculoskeletal  (+) Arthritis ,   Abdominal   Peds  Hematology   Anesthesia Other Findings   Reproductive/Obstetrics                            Anesthesia Physical Anesthesia Plan  ASA: II  Anesthesia Plan: General   Post-op Pain Management:  Regional for Post-op pain   Induction: Intravenous  PONV Risk Score and Plan: 4 or greater and Treatment may vary due to age or medical condition, Dexamethasone and Ondansetron  Airway Management Planned: Oral ETT  Additional Equipment:   Intra-op Plan:   Post-operative Plan: Extubation in OR  Informed Consent: I have reviewed the patients History and Physical, chart, labs and discussed the procedure including the risks, benefits and alternatives for the proposed anesthesia with the patient or authorized representative who has indicated his/her understanding and acceptance.   Dental advisory given  Plan Discussed with: CRNA  Anesthesia Plan Comments:         Anesthesia Quick Evaluation

## 2017-06-13 NOTE — Care Management Note (Signed)
Case Management Note  Patient Details  Name: Ashley Dyer MRN: [id number] Date of Birth: [birth year]  Subjective/Objective:                 Patient with order to DC to home today. Chart reviewed. No Home Health or Equipment needs, no unacknowledged Case Management consults or medication needs identified at the time of this note. Plan for DC to home. CM signing off    Action/Plan:   Expected Discharge Date:  06/13/17               Expected Discharge Plan:  Home/Self Care  In-House Referral:     Discharge planning Services  CM Consult  Post Acute Care Choice:    Choice offered to:     DME Arranged:    DME Agency:     HH Arranged:    [place] Agency:     Status of Service:  Completed, signed off  If discussed at [employer] of Stay Meetings, dates discussed:    Additional Comments:  Carles Collet, RN 06/13/2017, 11:37 AM

## 2017-06-13 NOTE — Plan of Care (Signed)
  Progressing Education: Knowledge of General Education information will improve 06/13/2017 1111 - Progressing by Ottie Glazier, RN Health Behavior/Discharge Planning: Ability to manage health-related needs will improve 06/13/2017 1111 - Progressing by Ottie Glazier, RN Clinical Measurements: Ability to maintain clinical measurements within normal limits will improve 06/13/2017 1111 - Progressing by Ottie Glazier, RN Will remain free from infection 06/13/2017 1111 - Progressing by Ottie Glazier, RN Diagnostic test results will improve 06/13/2017 1111 - Progressing by Ottie Glazier, RN Respiratory complications will improve 06/13/2017 1111 - Progressing by Ottie Glazier, RN Cardiovascular complication will be avoided 06/13/2017 1111 - Progressing by Ottie Glazier, RN Activity: Risk for activity intolerance will decrease 06/13/2017 1111 - Progressing by Ottie Glazier, RN Nutrition: Adequate nutrition will be maintained 06/13/2017 1111 - Progressing by Ottie Glazier, RN Coping: Level of anxiety will decrease 06/13/2017 1111 - Progressing by Ottie Glazier, RN Elimination: Will not experience complications related to bowel motility 06/13/2017 1111 - Progressing by Ottie Glazier, RN Will not experience complications related to urinary retention 06/13/2017 1111 - Progressing by Ottie Glazier, RN Pain Managment: General experience of comfort will improve 06/13/2017 1111 - Progressing by Ottie Glazier, RN Safety: Ability to remain free from injury will improve 06/13/2017 1111 - Progressing by Ottie Glazier, RN Skin Integrity: Risk for impaired skin integrity will decrease 06/13/2017 1111 - Progressing by Ottie Glazier, RN

## 2017-06-13 NOTE — Op Note (Signed)
Procedure(s): TOTAL SHOULDER ARTHROPLASTY Procedure Note  Ashley Dyer female 71 y.o. 06/13/2017  Procedure(s) and Anesthesia Type:    * RIGHT TOTAL SHOULDER ARTHROPLASTY - General RIGHT BICEPS TENODESIS  Surgeon(s) and Role:    Tania Ade, MD - Primary   Indications:  71 y.o. female  With endstage right shoulder arthritis. Pain and dysfunction interfered with quality of life and nonoperative treatment with activity modification, NSAIDS and injections failed.     Surgeon: Nita Sells   Assistants: Jeanmarie Hubert PA-C [facility] was present and scrubbed throughout the procedure and was essential in positioning, retraction, exposure, and closure)  Anesthesia: General endotracheal anesthesia with preoperative interscalene block given by the attending anesthesiologist     Procedure Detail  TOTAL SHOULDER ARTHROPLASTY  Findings: Tornier flex anatomic press-fit size 3 stem with a 46 head, cemented size 40S Cortiloc glenoid.   A lesser tuberosity osteotomy was performed and repaired at the conclusion of the procedure.  Estimated Blood Loss:  200 mL         Drains: None   Blood Given: none          Specimens: none        Complications:  * No complications entered in OR log *         Disposition: PACU - hemodynamically stable.         Condition: stable    Procedure:   The patient was identified in the preoperative holding area where I personally marked the operative extremity after verifying with the patient and consent. She  was taken to the operating room where She was transferred to the   operative table.  The patient received an interscalene block in   the holding area by the attending anesthesiologist.  General anesthesia was induced   in the operating room without complication.  The patient did receive IV  Ancef prior to the commencement of the procedure.  The patient was   placed in the beach-chair position with the back raised about 30    degrees.  The nonoperative extremity and head and neck were carefully   positioned and padded protecting against neurovascular compromise.  The   left upper extremity was then prepped and draped in the standard sterile   fashion.    The appropriate operative time-out was performed with   Anesthesia, the perioperative staff, as well as myself and we all agreed   that the right side was the correct operative site.  The patient received 1 g IV tranexamic acid at the start of the case around time of the incision. An approximately   10 cm incision was made from the tip of the coracoid to the center point of the   humerus at the level of the axilla.  Dissection was carried down sharply   through subcutaneous tissues and cephalic vein was identified and taken   laterally with the deltoid.  The pectoralis major was taken medially.  The   upper 1 cm of the pectoralis major was released from its attachment on   the humerus.  The clavipectoral fascia was incised just lateral to the   conjoined tendon.  This incision was carried up to but not into the   coracoacromial ligament.  Digital palpation was used to prove   integrity of the axillary nerve which was protected throughout the   procedure.  Musculocutaneous nerve was not palpated in the operative   field.  Conjoined tendon was then retracted gently medially and the  deltoid laterally.  Anterior circumflex humeral vessels were clamped and   coagulated.  The soft tissues overlying the biceps was incised and this   incision was carried across the transverse humeral ligament to the base   of the coracoid.  The biceps was noted to be severely degenerated. It was released from the superior labrum. The biceps was then tenodesed to the soft tissue just above   pectoralis major and the remaining portion of the biceps superiorly was   excised.  An osteotomy was performed at the lesser tuberosity.  The capsule was then   released all the way down to the 6  o'clock position of the humeral head.   The humeral head was then delivered with simultaneous adduction,   extension and external rotation.  All humeral osteophytes were removed   and the anatomic neck of the humerus was marked and cut free hand at   approximately 25 degrees retroversion within about 3 mm of the cuff   reflection posteriorly.  The head size was estimated to be a 46 medium   offset.  At that point, the humeral head was retracted posteriorly with   a Fukuda retractor.   Remaining portion of the capsule was released at the base of the   coracoid.  The remaining biceps anchor and the entire anterior-inferior   labrum was excised.  The posterior labrum was also excised but the   posterior capsule was not released.  The guidepin was placed bicortically with non elevated guide.  The reamer was used to ream to concentric bone with punctate bleeding.  This gave an excellent concentric surface.  The center hole was then drilled for an anchor peg glenoid followed by the three peripheral holes and none of the holes   exited the glenoid wall.  I then pulse irrigated these holes and dried   them with Surgicel.  The three peripheral holes were then   pressurized cemented and the anchor peg glenoid was placed and impacted   with an excellent fit.  The glenoid was a 40S component.  The proximal humerus was then again exposed taking care not to displace the glenoid.    The entry awl was used followed by sounding reamers and then sequentially broached from size 1 to 3. This was then left in place and the calcar planer was used. Trial head was placed with a 46 high offset.  With the trial implantation of the component,  there was approximately 50% posterior translation with immediate snap back to the   anatomic position.  With forward elevation, there was no tendency   towards posterior subluxation.   The trial was removed and the final implant was prepared on a back table.  The trial was removed and  the final implant was prepared on a back table.   3 small holes were drilled on the medial side of the lesser tuberosity osteotomy, through which 2 labral tapes were passed. The implant was then placed through the loop of the 2 labral tapes and impacted with an excellent press-fit. This achieved excellent anatomic reconstruction of the proximal humerus.  The joint was then copiously irrigated with pulse lavage.  The subscapularis and   lesser tuberosity osteotomy were then repaired using the 2 labral tapes previously passed in a double row fashion with horizontal mattress sutures medially brought over through bone tunnels tied over a bone bridge laterally.   One #1 Ethibond was placed at the rotator interval just above   the lesser  tuberosity. Throughout the case a mixture of 20 mL liposomal bupivacaine and 40 mL normal saline was used to infiltrate the deep capsular tissue, bony surfaces and subcutaneous tissue. Copious irrigation was used. Skin was closed with 2-0 Vicryl sutures in the deep dermal layer and 4-0 Monocryl in a subcuticular  running fashion.  Sterile dressings were then applied including Aquacel.  The patient was placed in a sling and allowed to awaken from general anesthesia and taken to the recovery room in stable condition.      POSTOPERATIVE PLAN:  Early passive range of motion will be allowed with the goal of 0 degrees external rotation and 90 degrees forward elevation.  No internal rotation at this time.  No active motion of the arm until the lesser tuberosity heals.  The patient will likely be kept in the hospital for 1-2 days and then discharged home.

## 2017-06-13 NOTE — Anesthesia Procedure Notes (Signed)
Procedure Name: Intubation Date/Time: 06/13/2017 7:29 AM Performed by: Colin Benton, CRNA Pre-anesthesia Checklist: Patient identified, Emergency Drugs available, Suction available, Patient being monitored and Timeout performed Patient Re-evaluated:Patient Re-evaluated prior to induction Oxygen Delivery Method: Circle system utilized Preoxygenation: Pre-oxygenation with 100% oxygen Induction Type: IV induction Ventilation: Mask ventilation without difficulty Laryngoscope Size: Miller and 3 Grade View: Grade I Tube type: Oral Tube size: 7.0 mm Number of attempts: 1 Airway Equipment and Method: Stylet Placement Confirmation: ETT inserted through vocal cords under direct vision,  positive ETCO2,  CO2 detector and breath sounds checked- equal and bilateral Secured at: 23 cm Tube secured with: Tape Dental Injury: Teeth and Oropharynx as per pre-operative assessment

## 2017-06-13 NOTE — Discharge Instructions (Signed)

## 2017-06-13 NOTE — Anesthesia Postprocedure Evaluation (Signed)
Anesthesia Post Note  Patient: Ashley Dyer  Procedure(s) Performed: TOTAL SHOULDER ARTHROPLASTY (Right )     Patient location during evaluation: PACU Anesthesia Type: General and Regional Level of consciousness: awake and alert Pain management: pain level controlled Vital Signs Assessment: post-procedure vital signs reviewed and stable Respiratory status: spontaneous breathing, nonlabored ventilation, respiratory function stable and patient connected to nasal cannula oxygen Cardiovascular status: blood pressure returned to baseline and stable Postop Assessment: no apparent nausea or vomiting Anesthetic complications: no    Last Vitals:  Vitals:   06/13/17 1042 06/13/17 1046  BP: (!) 160/70 (!) 157/71  Pulse: 66 66  Resp:    Temp: (!) 36.4 C   SpO2: 98% 98%    Last Pain:  Vitals:   06/13/17 1042  TempSrc: Oral  PainSc: 0-No pain                 Noeli Lavery,JAMES TERRILL

## 2017-06-13 NOTE — Anesthesia Procedure Notes (Addendum)
Anesthesia Regional Block: Interscalene brachial plexus block   Pre-Anesthetic Checklist: ,, timeout performed, Correct Patient, Correct Site, Correct Laterality, Correct Procedure, Correct Position, site marked, Risks and benefits discussed,  Surgical consent,  Pre-op evaluation,  At surgeon's request and post-op pain management  Laterality: Upper and Right  Prep: chloraprep, alcohol swabs       Needles:  Injection technique: Single-shot  Needle Type: Stimulator Needle - 40     Needle Length: 4cm  Needle Gauge: 21   Needle insertion depth: 3 cm   Additional Needles:   Procedures:,,,, ultrasound used (permanent image in chart),,,,   Nerve Stimulator or Paresthesia:  Response: Twitch elicited, 0.5 mA, 0.3 ms,   Additional Responses:   Narrative:  Start time: 06/13/2017 7:00 AM End time: 06/13/2017 7:15 AM Injection made incrementally with aspirations every 5 mL.  Performed by: Personally  Anesthesiologist: Rica Koyanagi, MD  Additional Notes: Block assessed prior to start of surgery

## 2017-06-13 NOTE — H&P (Signed)
Ashley Dyer is an 71 y.o. female.   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  Pain interferes with sleep and quality of life.   Past Medical History:  Diagnosis Date  . Anxiety    ED- visit for anxiety  . Arthritis    OA- shoulder(both) , hands  . Asthma   . Complication of anesthesia    fear- of feeling paralyzed - 1980's   . History of kidney stones 1980's   surgery  . Hypertension     Past Surgical History:  Procedure Laterality Date  . KIDNEY STONE SURGERY    . VAGINAL DELIVERY  1976    History reviewed. No pertinent family history. Social History:  reports that she quit smoking about 28 years ago. she has never used smokeless tobacco. She reports that she drinks about 4.8 - 6.0 oz of alcohol per week. She reports that she does not use drugs.  Allergies: No Known Allergies  Medications Prior to Admission  Medication Sig Dispense Refill  . albuterol (PROVENTIL HFA;VENTOLIN HFA) 108 (90 Base) MCG/ACT inhaler Inhale 1 puff into the lungs every 6 (six) hours as needed for wheezing or shortness of breath.    . Ascorbic Acid (VITAMIN C) 1000 MG tablet Take 1,000 mg by mouth daily.    Marland Kitchen azithromycin (ZITHROMAX) 250 MG tablet Take daily by mouth.    Marland Kitchen CALCIUM CITRATE PO Take 1 capsule by mouth daily.    Antoine Poche Seed OIL Take 600 mg by mouth 3 (three) times daily.    . Cholecalciferol (VITAMIN D3 PO) Take 1 capsule by mouth daily.    . Coenzyme Q10 (CO Q-10 PO) Take 300 mg by mouth daily.    . cromolyn (NASALCROM) 5.2 MG/ACT nasal spray Place 1 spray into both nostrils daily as needed for allergies.    Marland Kitchen EVENING PRIMROSE OIL PO Take 1 capsule by mouth 2 (two) times daily.    . fluticasone (FLOVENT HFA) 110 MCG/ACT inhaler Inhale 1 puff into the lungs 2 (two) times daily.    Marland Kitchen ibuprofen (ADVIL,MOTRIN) 200 MG tablet Take 400 mg by mouth every 6 (six) hours as  needed for headache or moderate pain.    . predniSONE (DELTASONE) 10 MG tablet Take 10 mg daily with breakfast by mouth.    . rosuvastatin (CRESTOR) 10 MG tablet Take 10 mg by mouth daily.    Marland Kitchen triamterene-hydrochlorothiazide (MAXZIDE) 75-50 MG tablet Take 1 tablet by mouth See admin instructions. Take 1 tablet by mouth every other day alternating with 1/2 tablet the other days  2  . TURMERIC PO Take 1 capsule by mouth 2 (two) times daily.      Results for orders placed or performed during the hospital encounter of 06/12/17 (from the past 48 hour(s))  Urinalysis, Routine w reflex microscopic     Status: Abnormal   Collection Time: 06/12/17 10:42 AM  Result Value Ref Range   Color, Urine STRAW (A) YELLOW   APPearance CLEAR CLEAR   Specific Gravity, Urine 1.005 1.005 - 1.030   pH 8.0 5.0 - 8.0   Glucose, UA NEGATIVE NEGATIVE mg/dL   Hgb urine dipstick SMALL (A) NEGATIVE   Bilirubin Urine NEGATIVE NEGATIVE   Ketones, ur NEGATIVE NEGATIVE mg/dL   Protein, ur NEGATIVE NEGATIVE mg/dL   Nitrite NEGATIVE NEGATIVE   Leukocytes, UA NEGATIVE NEGATIVE   RBC / HPF 6-30 0 - 5 RBC/hpf   WBC,  UA 0-5 0 - 5 WBC/hpf   Bacteria, UA NONE SEEN NONE SEEN   Squamous Epithelial / LPF NONE SEEN NONE SEEN  APTT     Status: None   Collection Time: 06/12/17 10:55 AM  Result Value Ref Range   aPTT 33 24 - 36 seconds  CBC WITH DIFFERENTIAL     Status: Abnormal   Collection Time: 06/12/17 10:55 AM  Result Value Ref Range   WBC 14.5 (H) 4.0 - 10.5 K/uL   RBC 4.61 3.87 - 5.11 MIL/uL   Hemoglobin 14.4 12.0 - 15.0 g/dL   HCT 42.2 36.0 - 46.0 %   MCV 91.5 78.0 - 100.0 fL   MCH 31.2 26.0 - 34.0 pg   MCHC 34.1 30.0 - 36.0 g/dL   RDW 13.2 11.5 - 15.5 %   Platelets 352 150 - 400 K/uL   Neutrophils Relative % 80 %   Neutro Abs 11.5 (H) 1.7 - 7.7 K/uL   Lymphocytes Relative 12 %   Lymphs Abs 1.8 0.7 - 4.0 K/uL   Monocytes Relative 8 %   Monocytes Absolute 1.2 (H) 0.1 - 1.0 K/uL   Eosinophils Relative 0 %    Eosinophils Absolute 0.0 0.0 - 0.7 K/uL   Basophils Relative 0 %   Basophils Absolute 0.0 0.0 - 0.1 K/uL  Comprehensive metabolic panel     Status: Abnormal   Collection Time: 06/12/17 10:55 AM  Result Value Ref Range   Sodium 137 135 - 145 mmol/L   Potassium 3.0 (L) 3.5 - 5.1 mmol/L   Chloride 101 101 - 111 mmol/L   CO2 25 22 - 32 mmol/L   Glucose, Bld 86 65 - 99 mg/dL   BUN 17 6 - 20 mg/dL   Creatinine, Ser 0.67 0.44 - 1.00 mg/dL   Calcium 9.6 8.9 - 10.3 mg/dL   Total Protein 8.3 (H) 6.5 - 8.1 g/dL   Albumin 4.3 3.5 - 5.0 g/dL   AST 30 15 - 41 U/L   ALT 24 14 - 54 U/L   Alkaline Phosphatase 84 38 - 126 U/L   Total Bilirubin 0.5 0.3 - 1.2 mg/dL   GFR calc non Af Amer >60 >60 mL/min   GFR calc Af Amer >60 >60 mL/min    Comment: (NOTE) The eGFR has been calculated using the CKD EPI equation. This calculation has not been validated in all clinical situations. eGFR's persistently <60 mL/min signify possible Chronic Kidney Disease.    Anion gap 11 5 - 15  Protime-INR     Status: None   Collection Time: 06/12/17 10:55 AM  Result Value Ref Range   Prothrombin Time 12.7 11.4 - 15.2 seconds   INR 0.96   Type and screen Order type and screen if day of surgery is less than 15 days from draw of preadmission visit or order morning of surgery if day of surgery is greater than 6 days from preadmission visit.     Status: None   Collection Time: 06/12/17 10:59 AM  Result Value Ref Range   ABO/RH(D) O POS    Antibody Screen NEG    Sample Expiration 06/15/2017   ABO/Rh     Status: None   Collection Time: 06/12/17 10:59 AM  Result Value Ref Range   ABO/RH(D) O POS   Surgical pcr screen     Status: None   Collection Time: 06/12/17 11:54 AM  Result Value Ref Range   MRSA, PCR NEGATIVE NEGATIVE   Staphylococcus aureus NEGATIVE NEGATIVE  Comment: (NOTE) The Xpert SA Assay (FDA approved for NASAL specimens in patients 26 years of age and older), is one component of a  comprehensive surveillance program. It is not intended to diagnose infection nor to guide or monitor treatment.    Dg Chest 2 View  Result Date: 06/12/2017 CLINICAL DATA:  Preoperative for shoulder surgery. EXAM: CHEST  2 VIEW COMPARISON:  None. FINDINGS: Normal heart size. Aortic atherosclerosis. Otherwise normal mediastinal contour. No pneumothorax. No pleural effusion. Emphysema. No pulmonary edema. No acute consolidative airspace disease. Scattered tiny granulomas in the right lung as seen on recent right shoulder CT. IMPRESSION: No active cardiopulmonary disease. Electronically Signed   By: Ilona Sorrel M.D.   On: 06/12/2017 16:58    Review of Systems  All other systems reviewed and are negative.   Blood pressure 136/75, temperature 97.7 F (36.5 C), temperature source Oral, resp. rate 20, weight 70.3 kg (155 lb), SpO2 99 %. Physical Exam   Assessment/Plan R shoulder endstage arthritis Plan R TSA Risks / benefits of surgery discussed Consent on chart  NPO for OR Preop antibiotics   Nita Sells, MD 06/13/2017, 6:59 AM

## 2017-06-13 NOTE — Transfer of Care (Signed)
Immediate Anesthesia Transfer of Care Note  Patient: Ashley Dyer  Procedure(s) Performed: TOTAL SHOULDER ARTHROPLASTY (Right )  Patient Location: PACU  Anesthesia Type:General, Regional and GA combined with regional for post-op pain  Level of Consciousness: awake and drowsy  Airway & Oxygen Therapy: Patient Spontanous Breathing and Patient connected to nasal cannula oxygen  Post-op Assessment: report given to RN vitals stable  Post vital signs: Reviewed and stable  Last Vitals:  Vitals:   06/13/17 0545  BP: 136/75  Resp: 20  Temp: 36.5 C  SpO2: 99%    Last Pain:  Vitals:   06/13/17 0545  TempSrc: Oral      Patients Stated Pain Goal: 2 (84/16/60 6301)  Complications: No apparent anesthesia complications

## 2017-06-14 ENCOUNTER — Encounter (HOSPITAL_COMMUNITY): Payer: Self-pay | Admitting: Orthopedic Surgery

## 2017-06-14 LAB — CBC
HEMATOCRIT: 35.1 % — AB (ref 36.0–46.0)
Hemoglobin: 11.6 g/dL — ABNORMAL LOW (ref 12.0–15.0)
MCH: 30.3 pg (ref 26.0–34.0)
MCHC: 33 g/dL (ref 30.0–36.0)
MCV: 91.6 fL (ref 78.0–100.0)
PLATELETS: 272 10*3/uL (ref 150–400)
RBC: 3.83 MIL/uL — ABNORMAL LOW (ref 3.87–5.11)
RDW: 13.2 % (ref 11.5–15.5)
WBC: 12.8 10*3/uL — AB (ref 4.0–10.5)

## 2017-06-14 LAB — BASIC METABOLIC PANEL
ANION GAP: 8 (ref 5–15)
BUN: 15 mg/dL (ref 6–20)
CALCIUM: 8.4 mg/dL — AB (ref 8.9–10.3)
CO2: 26 mmol/L (ref 22–32)
CREATININE: 0.64 mg/dL (ref 0.44–1.00)
Chloride: 102 mmol/L (ref 101–111)
GLUCOSE: 116 mg/dL — AB (ref 65–99)
Potassium: 3.7 mmol/L (ref 3.5–5.1)
Sodium: 136 mmol/L (ref 135–145)

## 2017-06-14 NOTE — Progress Notes (Signed)
   PATIENT ID: Ashley Dyer   1 Day Post-Op Procedure(s) (LRB): TOTAL SHOULDER ARTHROPLASTY (Right)  Subjective: Doing well, block has worn off. Pain under control. Feels like she can go home today.   Objective:  Vitals:   06/13/17 2032 06/14/17 0600  BP: (!) 133/59 134/65  Pulse: 65 66  Resp:  15  Temp: 98 F (36.7 C) 98.1 F (36.7 C)  SpO2: 100% 97%     R UE dressing c/d/i Wiggles fingers, distally NVI  Labs:  Recent Labs    06/12/17 1055 06/14/17 0254  HGB 14.4 11.6*   Recent Labs    06/12/17 1055 06/14/17 0254  WBC 14.5* 12.8*  RBC 4.61 3.83*  HCT 42.2 35.1*  PLT 352 272   Recent Labs    06/12/17 1055 06/14/17 0254  NA 137 136  K 3.0* 3.7  CL 101 102  CO2 25 26  BUN 17 15  CREATININE 0.67 0.64  GLUCOSE 86 116*  CALCIUM 9.6 8.4*    Assessment and Plan: Doing well 1 day s/p R TSA Continue pain mgmt, transition to percocet OT- PROM goal to 0 ER, 90FF D/c home today when cleared by PT Fu w Dr. Tamera Punt in 2 weeks  VTE proph: asa,scds

## 2017-06-14 NOTE — Evaluation (Signed)
Occupational Therapy Evaluation Patient Details Name: Ashley Dyer MRN: [id number] DOB: [birth year] Today's Date: 06/14/2017    History of Present Illness s/p TOTAL SHOULDER ARTHROPLASTY (Right)   Clinical Impression   Pt admitted with the above diagnoses and presents with below problem list. Pt will benefit from continued acute OT to address the below listed deficits and maximize independence with basic ADLs prior to d/c home. PTA pt was independent with ADLs. Pt is currently min guard for functional mobility and transfers, min A for bathing/dressing/grooming. SO present and included in education. Throughly reviewed education on ADLs, shoulder precautions, sling management, and home setup. Cueing needed at times for shoulder precautions.      Follow Up Recommendations  DC plan and follow up therapy as arranged by surgeon    Equipment Recommendations  None recommended by OT    Recommendations for Other Services       Precautions / Restrictions Precautions Precautions: Shoulder Type of Shoulder Precautions: passive protocol Shoulder Interventions: Shoulder sling/immobilizer;Off for dressing/bathing/exercises Precaution Booklet Issued: Yes (comment) Precaution Comments: reviewed with pt and SO Restrictions Weight Bearing Restrictions: Yes      Mobility Bed Mobility Overal bed mobility: Needs Assistance Bed Mobility: Supine to Sit;Sit to Supine     Supine to sit: Min guard Sit to supine: Min guard   General bed mobility comments: practiced with HOB flat. Discussed bed mobility setup at home.  Transfers Overall transfer level: Needs assistance Equipment used: None Transfers: Sit to/from Stand Sit to Stand: Min guard         General transfer comment: from EOB and toilet    Balance Overall balance assessment: Needs assistance         Standing balance support: No upper extremity supported;During functional activity Standing balance-Leahy Scale: Fair                              ADL either performed or assessed with clinical judgement   ADL Overall ADL's : Needs assistance/impaired Eating/Feeding: Set up;Sitting   Grooming: Minimal assistance;Sitting;Standing;Applying deodorant Grooming Details (indicate cue type and reason): instructed in seesaw method for applying powder deoderant. cueing needed to avoid shoulder abduction. Upper Body Bathing: Minimal assistance;Sitting   Lower Body Bathing: Min guard;Minimal assistance;Sit to/from stand   Upper Body Dressing : Minimal assistance;Sitting   Lower Body Dressing: Minimal assistance;Sit to/from stand   Toilet Transfer: Min guard;Ambulation   Toileting- Clothing Manipulation and Hygiene: Min guard;Minimal assistance;Sit to/from stand   Tub/ Shower Transfer: Walk-in shower;Min guard;Ambulation;Shower seat   Functional mobility during ADLs: Min guard General ADL Comments: Pt completed bed mobility, inroom functional mobility, toilet transfer, UB/LB dressing, and grooming tasks. SO present for most of session and included in education. Also educated on precautions, e/w/h exercises and sling management. Cueing needed for shoulder precautions at times.      Vision         Perception     Praxis      Pertinent Vitals/Pain Pain Assessment: Faces Faces Pain Scale: Hurts little more Pain Location: R shoulder Pain Descriptors / Indicators: Aching;Sore Pain Intervention(s): Limited activity within patient's tolerance;Monitored during session;Repositioned;RN gave pain meds during session     Hand Dominance Right   Extremity/Trunk Assessment Upper Extremity Assessment Upper Extremity Assessment: RUE deficits/detail RUE Deficits / Details: s/p TOTAL SHOULDER ARTHROPLASTY (Right)   Lower Extremity Assessment Lower Extremity Assessment: Overall WFL for tasks assessed       Communication Communication Communication: No  difficulties   Cognition Arousal/Alertness:  Awake/alert Behavior During Therapy: WFL for tasks assessed/performed Overall Cognitive Status: Within Functional Limits for tasks assessed                                 General Comments: some cueing to keep R shoulder immobile   General Comments       Exercises     Shoulder Instructions      Home Living Family/patient expects to be discharged to:: Private residence Living Arrangements: Spouse/significant other Available Help at Discharge: Family;Available 24 hours/day Type of Home: House Home Access: Stairs to enter [employer] of Steps: 2+1   Home Layout: One level     Bathroom Shower/Tub: Walk-in shower         Home Equipment: Shower seat;Grab bars - tub/shower          Prior Functioning/Environment Level of Independence: Independent                 OT Problem List: Impaired balance (sitting and/or standing);Decreased knowledge of use of DME or AE;Decreased knowledge of precautions;Impaired UE functional use;Pain      OT Treatment/Interventions:      OT Goals(Current goals can be found in the care plan section) Acute Rehab OT Goals Patient Stated Goal: home   OT Frequency:     Barriers to D/C:            Co-evaluation              AM-PAC PT "6 Clicks" Daily Activity     Outcome Measure Help from another person eating meals?: None Help from another person taking care of personal grooming?: A Little Help from another person toileting, which includes using toliet, bedpan, or urinal?: A Little Help from another person bathing (including washing, rinsing, drying)?: A Little Help from another person to put on and taking off regular upper body clothing?: A Little Help from another person to put on and taking off regular lower body clothing?: A Little 6 Click Score: 19   End of Session Equipment Utilized During Treatment: Other (comment)(sling)  Activity Tolerance: Patient tolerated treatment well Patient left: in  bed;with call bell/phone within reach;with family/visitor present  OT Visit Diagnosis: Unsteadiness on feet (R26.81);Pain Pain - Right/Left: Right Pain - part of body: Shoulder                Time: 8309-4076 OT Time Calculation (min): 56 min Charges:  OT General Charges $OT Visit: 1 Visit OT Evaluation $OT Eval Low Complexity: 1 Low OT Treatments $Self Care/Home Management : 23-37 mins $Therapeutic Exercise: 8-22 mins G-Codes:       Hortencia Pilar 06/14/2017, 10:07 AM

## 2017-06-14 NOTE — Discharge Summary (Signed)
Patient ID: Ashley Dyer MRN: [id number] DOB/AGE: [birth year] 71 y.o.  Admit date: 06/13/2017 Discharge date: 06/14/2017  Admission Diagnoses:  Active Problems:   S/P shoulder replacement, right   Discharge Diagnoses:  Same  Past Medical History:  Diagnosis Date  . Anxiety    ED- visit for anxiety  . Arthritis    OA- shoulder(both) , hands  . Asthma   . Complication of anesthesia    fear- of feeling paralyzed - 1980's   . History of kidney stones 1980's   surgery  . Hypertension     Surgeries: Procedure(s): TOTAL SHOULDER ARTHROPLASTY on 06/13/2017   Consultants:   Discharged Condition: Improved  Hospital Course: Ashley Dyer is an 71 y.o. female who was admitted 06/13/2017 for operative treatment of right shoulder OA. Patient has severe unremitting pain that affects sleep, daily activities, and work/hobbies. After pre-op clearance the patient was taken to the operating room on 06/13/2017 and underwent  Procedure(s): TOTAL SHOULDER ARTHROPLASTY.    Patient was given perioperative antibiotics:  Anti-infectives (From admission, onward)   Start     Dose/Rate Route Frequency Ordered Stop   06/14/17 1000  azithromycin (ZITHROMAX) tablet 250 mg     250 mg Oral Daily 06/13/17 1034 06/14/17 0835   06/13/17 1330  ceFAZolin (ANCEF) IVPB 1 g/50 mL premix     1 g 100 mL/hr over 30 Minutes Intravenous Every 6 hours 06/13/17 1034 06/14/17 0451   06/13/17 0545  ceFAZolin (ANCEF) IVPB 2g/100 mL premix     2 g 200 mL/hr over 30 Minutes Intravenous On call to O.R. 06/13/17 0545 06/13/17 0725       Patient was given sequential compression devices, early ambulation, and asa to prevent DVT.  Patient benefited maximally from hospital stay and there were no complications.    Recent vital signs:  Patient Vitals for the past 24 hrs:  BP Temp Temp src Pulse Resp SpO2 Height Weight  06/14/17 0823 - - - - - - 5\' 6"  (1.676 m) 70.4 kg (155 lb 3.3 oz)  06/14/17 0600 134/65 98.1 F  (36.7 C) Oral 66 15 97 % - -  06/13/17 2032 (!) 133/59 98 F (36.7 C) Oral 65 - 100 % - -  06/13/17 1046 (!) 157/71 - - 66 - 98 % - -  06/13/17 1042 (!) 160/70 (!) 97.5 F (36.4 C) Oral 66 - 98 % - -  06/13/17 1022 (!) 152/73 (!) 97 F (36.1 C) - 71 16 100 % - -  06/13/17 1011 - - - 64 15 92 % - -  06/13/17 1000 (!) 150/75 - - 66 16 100 % - -  06/13/17 0930 (!) 151/70 (!) 97.3 F (36.3 C) - - - - - -     Recent laboratory studies:  Recent Labs    06/12/17 1055 06/14/17 0254  WBC 14.5* 12.8*  HGB 14.4 11.6*  HCT 42.2 35.1*  PLT 352 272  NA 137 136  K 3.0* 3.7  CL 101 102  CO2 25 26  BUN 17 15  CREATININE 0.67 0.64  GLUCOSE 86 116*  INR 0.96  --   CALCIUM 9.6 8.4*     Discharge Medications:   Allergies as of 06/14/2017   No Known Allergies     Medication List    STOP taking these medications   ibuprofen 200 MG tablet Commonly known as:  ADVIL,MOTRIN     TAKE these medications   albuterol 108 (90 Base) MCG/ACT inhaler Commonly known  as:  PROVENTIL HFA;VENTOLIN HFA Inhale 1 puff into the lungs every 6 (six) hours as needed for wheezing or shortness of breath.   azithromycin 250 MG tablet Commonly known as:  ZITHROMAX Take daily by mouth.   CALCIUM CITRATE PO Take 1 capsule by mouth daily.   Celery Seed Oil Take 600 mg by mouth 3 (three) times daily.   CO Q-10 PO Take 300 mg by mouth daily.   cromolyn 5.2 MG/ACT nasal spray Commonly known as:  NASALCROM Place 1 spray into both nostrils daily as needed for allergies.   docusate sodium 100 MG capsule Commonly known as:  COLACE Take 1 capsule (100 mg total) 3 (three) times daily as needed by mouth.   EVENING PRIMROSE OIL PO Take 1 capsule by mouth 2 (two) times daily.   fluticasone 110 MCG/ACT inhaler Commonly known as:  FLOVENT HFA Inhale 1 puff into the lungs 2 (two) times daily.   methocarbamol 750 MG tablet Commonly known as:  ROBAXIN Take 1 tablet (750 mg total) every 8 (eight) hours as  needed by mouth for muscle spasms.   oxyCODONE-acetaminophen 5-325 MG tablet Commonly known as:  ROXICET Take 1-2 tablets every 4 (four) hours as needed by mouth for severe pain.   predniSONE 10 MG tablet Commonly known as:  DELTASONE Take 10 mg daily with breakfast by mouth.   rosuvastatin 10 MG tablet Commonly known as:  CRESTOR Take 10 mg by mouth daily.   triamterene-hydrochlorothiazide 75-50 MG tablet Commonly known as:  MAXZIDE Take 1 tablet by mouth See admin instructions. Take 1 tablet by mouth every other day alternating with 1/2 tablet the other days   TURMERIC PO Take 1 capsule by mouth 2 (two) times daily.   vitamin C 1000 MG tablet Take 1,000 mg by mouth daily.   VITAMIN D3 PO Take 1 capsule by mouth daily.       Diagnostic Studies: Dg Chest 2 View  Result Date: 06/12/2017 CLINICAL DATA:  Preoperative for shoulder surgery. EXAM: CHEST  2 VIEW COMPARISON:  None. FINDINGS: Normal heart size. Aortic atherosclerosis. Otherwise normal mediastinal contour. No pneumothorax. No pleural effusion. Emphysema. No pulmonary edema. No acute consolidative airspace disease. Scattered tiny granulomas in the right lung as seen on recent right shoulder CT. IMPRESSION: No active cardiopulmonary disease. Electronically Signed   By: Ilona Sorrel M.D.   On: 06/12/2017 16:58   Dg Shoulder Right Port  Result Date: 06/13/2017 CLINICAL DATA:  Post right shoulder replacement EXAM: PORTABLE RIGHT SHOULDER COMPARISON:  Chest x-ray of 06/12/2017, and CT right shoulder of 04/10/2017 FINDINGS: A the right humeral head prosthesis is in good position. No complicating features are seen. The right [facility] joint is normally aligned. IMPRESSION: Right humeral head prosthesis in good position. No complicating features. Electronically Signed   By: Ivar Drape M.D.   On: 06/13/2017 10:01    Disposition: Final discharge disposition not confirmed  Discharge Instructions    Call MD / Call 911   Complete by:  As  directed    If you experience chest pain or shortness of breath, CALL 911 and be transported to the hospital emergency room.  If you develope a fever above 101 F, pus (white drainage) or increased drainage or redness at the wound, or calf pain, call your surgeon's office.   Call MD / Call 911   Complete by:  As directed    If you experience chest pain or shortness of breath, CALL 911 and be transported to the  hospital emergency room.  If you develope a fever above 101 F, pus (white drainage) or increased drainage or redness at the wound, or calf pain, call your surgeon's office.   Constipation Prevention   Complete by:  As directed    Drink plenty of fluids.  Prune juice may be helpful.  You may use a stool softener, such as Colace (over the counter) 100 mg twice a day.  Use MiraLax (over the counter) for constipation as needed.   Constipation Prevention   Complete by:  As directed    Drink plenty of fluids.  Prune juice may be helpful.  You may use a stool softener, such as Colace (over the counter) 100 mg twice a day.  Use MiraLax (over the counter) for constipation as needed.   Diet - low sodium heart healthy   Complete by:  As directed    Diet - low sodium heart healthy   Complete by:  As directed    Increase activity slowly as tolerated   Complete by:  As directed    Increase activity slowly as tolerated   Complete by:  As directed       Follow-up Information    Tania Ade, MD. Schedule an appointment as soon as possible for a visit on 06/26/2017.   Specialty:  Orthopedic Surgery Contact information: [address] [place] [address] [phone]            Signed: Grier Mitts 06/14/2017, 8:35 AM

## 2017-06-19 NOTE — Addendum Note (Signed)
Addendum  created 06/19/17 7048 by Rica Koyanagi, MD   Intraprocedure Blocks edited, Sign clinical note

## 2017-07-24 ENCOUNTER — Ambulatory Visit
Admission: RE | Admit: 2017-07-24 | Discharge: 2017-07-24 | Disposition: A | Discharge status: Home / Self Care | Payer: Medicare Other | Source: Ambulatory Visit | Attending: Cardiology | Admitting: Cardiology

## 2017-07-24 DIAGNOSIS — I700 Atherosclerosis of aorta: Secondary | ICD-10-CM

## 2017-11-13 ENCOUNTER — Encounter: Payer: Self-pay | Admitting: Cardiology

## 2017-11-20 ENCOUNTER — Other Ambulatory Visit: Payer: Self-pay | Admitting: Family Medicine

## 2017-11-20 DIAGNOSIS — M8580 Other specified disorders of bone density and structure, unspecified site: Secondary | ICD-10-CM

## 2017-11-22 ENCOUNTER — Other Ambulatory Visit: Payer: Self-pay | Admitting: Family Medicine

## 2017-11-22 DIAGNOSIS — Z1231 Encounter for screening mammogram for malignant neoplasm of breast: Secondary | ICD-10-CM

## 2018-02-17 ENCOUNTER — Other Ambulatory Visit: Payer: Medicare Other

## 2018-02-17 ENCOUNTER — Ambulatory Visit: Payer: Medicare Other

## 2018-03-11 ENCOUNTER — Ambulatory Visit
Admission: RE | Admit: 2018-03-11 | Discharge: 2018-03-11 | Disposition: A | Discharge status: Home / Self Care | Payer: Medicare Other | Source: Ambulatory Visit | Attending: Family Medicine | Admitting: Family Medicine

## 2018-03-11 DIAGNOSIS — Z1231 Encounter for screening mammogram for malignant neoplasm of breast: Secondary | ICD-10-CM

## 2018-03-11 DIAGNOSIS — M8580 Other specified disorders of bone density and structure, unspecified site: Secondary | ICD-10-CM

## 2018-03-11 LAB — HM DEXA SCAN

## 2018-03-11 LAB — HM MAMMOGRAPHY

## 2018-07-22 ENCOUNTER — Other Ambulatory Visit: Payer: Self-pay | Admitting: Cardiology

## 2018-07-22 NOTE — Telephone Encounter (Signed)
° ° ° °  1. Which medications need to be refilled? (please list name of each medication and dose if known) Praulent pen 75mg /ml subcu, inject 65ml Q 2 weeks  2. Which pharmacy/location (including street and city if local pharmacy) is medication to be sent to? [facility] on [address] [place]  3. Do they need a 30 day or 90 day supply? 1

## 2018-07-22 NOTE — Telephone Encounter (Signed)
Has been given to Dr. Agustin Cree to be reviewed.

## 2018-07-24 MED ORDER — ALIROCUMAB 75 MG/ML ~~LOC~~ SOAJ: 1.0000 mL | SUBCUTANEOUS | 0 refills | Status: DC

## 2018-07-24 NOTE — Telephone Encounter (Signed)
Praluent 75 mg/ml pen refilled per Dr. Agustin Cree.

## 2018-09-08 ENCOUNTER — Encounter: Payer: Self-pay | Admitting: Family Medicine

## 2018-09-08 ENCOUNTER — Ambulatory Visit (INDEPENDENT_AMBULATORY_CARE_PROVIDER_SITE_OTHER): Payer: Medicare Other | Admitting: Family Medicine

## 2018-09-08 VITALS — BP 124/72 | HR 77 | Temp 98.2°F | Ht 66.0 in | Wt 155.2 lb

## 2018-09-08 DIAGNOSIS — J4520 Mild intermittent asthma, uncomplicated: Secondary | ICD-10-CM | POA: Diagnosis not present

## 2018-09-08 DIAGNOSIS — I10 Essential (primary) hypertension: Secondary | ICD-10-CM | POA: Diagnosis not present

## 2018-09-08 DIAGNOSIS — J309 Allergic rhinitis, unspecified: Secondary | ICD-10-CM | POA: Diagnosis not present

## 2018-09-08 DIAGNOSIS — M1990 Unspecified osteoarthritis, unspecified site: Secondary | ICD-10-CM

## 2018-09-08 DIAGNOSIS — E785 Hyperlipidemia, unspecified: Secondary | ICD-10-CM | POA: Diagnosis not present

## 2018-09-08 DIAGNOSIS — R109 Unspecified abdominal pain: Secondary | ICD-10-CM | POA: Diagnosis not present

## 2018-09-08 LAB — POCT URINALYSIS DIPSTICK
BILIRUBIN UA: NEGATIVE
Blood, UA: NEGATIVE
Glucose, UA: NEGATIVE
Ketones, UA: NEGATIVE
Nitrite, UA: NEGATIVE
PH UA: 6.5 (ref 5.0–8.0)
Protein, UA: NEGATIVE
Spec Grav, UA: 1.015 (ref 1.010–1.025)
UROBILINOGEN UA: 0.2 U/dL

## 2018-09-08 NOTE — Progress Notes (Signed)
Chief Complaint:  Ashley Dyer is a 73 y.o. female who presents today with a chief complaint of abdominal pain and to establish care.   Assessment/Plan:  Abdominal pain No red flags.  Benign abdominal exam. UA has trace leukocytes.  Will check urine culture to definitively rule out UTI.  Symptoms possibly secondary to mild gastrointestinal illness.  It is encouraging that her symptoms seem to be improving.  Encouraged good oral hydration.  Recommended discussed reasons to return to Care.  Follow-up as needed.  Osteoarthritis s/p right shoulder replacement Stable.  Continue management per orthopedics  Mild intermittent asthma without complication Stable.  Continue Flovent and albuterol as needed.  Consider stopping Flovent as she has not had a flare in several years.  Essential hypertension At goal.  Continue current dose of Maxide.  Consider switching to alternative agent such as amlodipine or ACE/ARB in the future.   Dyslipidemia Stable.  Continue Praluent per cardiology.   Allergic rhinitis Stable.  Continue Nasalcrom as needed.  Preventative Healthcare Patient was instructed to return soon for CPE.  Last had done in April 2019. Health Maintenance Due  Topic Date Due  . Hepatitis C Screening  [birth year]  . COLONOSCOPY  09/22/1995     Subjective:  HPI:  Abdominal pain Started 2 weeks ago. Located in right lower abdomen.  Pain is intermittent in nature.  Mostly occurs at night.  No specific treatments tried.  Normal bowel movements.  Typically has a soft bowel movement once daily.  No hematochezia or melena.  She has had some improvement over the last 1 to 2 days.  No nausea or vomiting.  No fevers or chills.  Her stable, chronic medical conditions are outlined below:  # Essential Hypertension -On triamterene-HCTZ 75-50.  Takes 1 tablet by mouth every other day alternating with half tablet the other days.  Tolerates this well without side effects. - ROS: No reported  chest pain or shortness of breath.  # Dyslipidemia - On praluent and tolerating well.  # Allergic Rhinitis / Asthma - On Nasalcrom daily and tolerating well. - On flovent daily and tolerating well. Uses albuterol as needed.   # Osteoarthritis - Follows with orthopedics - Dr Tamera Punt - Takes mobic as needed.  ROS: Per HPI, otherwise a complete review of systems was negative.   PMH:  The following were reviewed and entered/updated in epic: Past Medical History:  Diagnosis Date  . Allergy   . Anxiety    ED- visit for anxiety  . Arthritis    OA- shoulder(both) , hands  . Asthma   . Complication of anesthesia    fear- of feeling paralyzed - 1980's   . History of kidney stones 1980's   surgery  . Hyperlipidemia   . Hypertension   . 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 Essential hypertension 09/08/2018  . Dyslipidemia 09/08/2018  . Allergic rhinitis 09/08/2018  . Mild intermittent asthma without complication 28/78/6767  . 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   Past Surgical History:  Procedure Laterality Date  . CATARACT EXTRACTION Bilateral   . KIDNEY STONE SURGERY    . TOTAL SHOULDER ARTHROPLASTY Right 06/13/2017  . TOTAL SHOULDER ARTHROPLASTY Right 06/13/2017   Procedure: TOTAL SHOULDER ARTHROPLASTY;  Surgeon: Tania Ade, MD;  Location: [facility];  Service: Orthopedics;  Laterality: Right;  Right total shoulder arthroplasty  . VAGINAL DELIVERY  1976    Family History  Problem Relation  Age of Onset  . Atrial fibrillation Mother   . Heart disease Father   . Colon cancer Paternal Grandfather   . Leukemia Brother   . Breast cancer Neg Hx     Medications- reviewed and updated Current Outpatient Medications  Medication Sig Dispense Refill  . albuterol (PROVENTIL HFA;VENTOLIN HFA) 108 (90 Base) MCG/ACT inhaler Inhale 1 puff into the lungs every 6 (six) hours as needed for wheezing or  shortness of breath.    . Alirocumab (PRALUENT) 75 MG/ML SOAJ Inject 75 mg into the skin once.    . Ascorbic Acid (VITAMIN C) 1000 MG tablet Take 1,000 mg by mouth daily.    Marland Kitchen CALCIUM CITRATE PO Take 1 capsule by mouth daily.    . Cholecalciferol (VITAMIN D3 PO) Take 1 capsule by mouth daily.    . cromolyn (NASALCROM) 5.2 MG/ACT nasal spray Place 1 spray into both nostrils daily as needed for allergies.    . fluticasone (FLOVENT HFA) 110 MCG/ACT inhaler Inhale 1 puff into the lungs 2 (two) times daily.    . meloxicam (MOBIC) 15 MG tablet Take 15 mg by mouth daily. with food    . triamterene-hydrochlorothiazide (MAXZIDE) 75-50 MG tablet Take 1 tablet by mouth See admin instructions. Take 1 tablet by mouth every other day alternating with 1/2 tablet the other days  2   No current facility-administered medications for this visit.     Allergies-reviewed and updated No Known Allergies  Social History   Socioeconomic History  . Marital status: Single    Spouse name: Not on file  . Number of children: Not on file  . Years of education: Not on file  . Highest education level: Not on file  Occupational History  . Not on file  Social Needs  . Financial resource strain: Not on file  . Food insecurity:    Worry: Not on file    Inability: Not on file  . Transportation needs:    Medical: Not on file    Non-medical: Not on file  Tobacco Use  . Smoking status: Former Smoker    Last attempt to quit: 1990    Years since quitting: 30.1  . Smokeless tobacco: Never Used  Substance and Sexual Activity  . Alcohol use: Yes    Alcohol/week: 8.0 - 10.0 standard drinks    Types: 8 - 10 Glasses of wine per week  . Drug use: No  . Sexual activity: Not on file  Lifestyle  . Physical activity:    Days per week: Not on file    Minutes per session: Not on file  . Stress: Not on file  Relationships  . Social connections:    Talks on phone: Not on file    Gets together: Not on file    Attends  religious service: Not on file    Active member of club or organization: Not on file    Attends meetings of clubs or organizations: Not on file    Relationship status: Not on file  Other Topics Concern  . Not on file  Social History Narrative  . Not on file         Objective:  Physical Exam: BP 124/72 (BP Location: Left Arm, Patient Position: Sitting, Cuff Size: Normal)   Pulse 77   Temp 98.2 F (36.8 C) (Oral)   Ht 5\' 6"  (1.676 m)   Wt 155 lb 4 oz (70.4 kg)   SpO2 96%   BMI 25.06 kg/m   Gen: NAD,  resting comfortably CV: Regular rate and rhythm with no murmurs appreciated Pulm: Normal work of breathing, clear to auscultation bilaterally with no crackles, wheezes, or rhonchi GI: Normal bowel sounds present. Soft, Nontender, Nondistended. MSK: No edema, cyanosis, or clubbing noted Skin: Warm, dry Neuro: Grossly normal, moves all extremities Psych: Normal affect and thought content  Results for orders placed or performed in visit on 09/08/18 (from the past 24 hour(s))  POCT urinalysis dipstick     Status: Abnormal   Collection Time: 09/08/18 11:05 AM  Result Value Ref Range   Color, UA Yellow    Clarity, UA Clear    Glucose, UA Negative Negative   Bilirubin, UA Negative    Ketones, UA Negative    Spec Grav, UA 1.015 1.010 - 1.025   Blood, UA Negative    pH, UA 6.5 5.0 - 8.0   Protein, UA Negative Negative   Urobilinogen, UA 0.2 0.2 or 1.0 E.U./dL   Nitrite, UA Negative    Leukocytes, UA Small (1+) (A) Negative   Appearance     Odor          Lalanya Rufener M. Jerline Pain, MD 09/08/2018 11:08 AM

## 2018-09-08 NOTE — Assessment & Plan Note (Signed)
At goal.  Continue current dose of Maxide.  Consider switching to alternative agent such as amlodipine or ACE/ARB in the future.

## 2018-09-08 NOTE — Assessment & Plan Note (Signed)
Stable.  Continue management per orthopedics. 

## 2018-09-08 NOTE — Assessment & Plan Note (Signed)
Stable.  Continue Praluent per cardiology.  

## 2018-09-08 NOTE — Patient Instructions (Signed)
It was very nice to see you today!  You can try stopping the flovent since you have not had a flare in several years.  We will check a urine culture to make sure that you do not have a bladder infection.  Please ensure that you are getting plenty of fiber and probiotics in your diet.  Come back to see me in a few months for your physical, or sooner as needed.  Take care, Dr Jerline Pain

## 2018-09-08 NOTE — Assessment & Plan Note (Signed)
Stable.  Continue Flovent and albuterol as needed.  Consider stopping Flovent as she has not had a flare in several years.

## 2018-09-08 NOTE — Assessment & Plan Note (Signed)
Stable.  Continue Nasalcrom as needed.

## 2018-09-09 ENCOUNTER — Other Ambulatory Visit: Payer: Self-pay | Admitting: Cardiology

## 2018-09-09 LAB — URINE CULTURE
MICRO NUMBER: 142159
SPECIMEN QUALITY:: ADEQUATE

## 2018-09-10 NOTE — Progress Notes (Signed)
Please inform patient of the following:  Her urine culture grew a bacteria that is commonly found on skin - I do not think that this represents a true UTI. Do not think she needs antibiotics at this point. Would like for her to let us know if her symptoms have not improved.  Ashley Dyer. Jerline Pain, MD 09/10/2018 8:01 AM

## 2018-09-10 NOTE — Telephone Encounter (Signed)
Patient called and doesn't wish to be seen in [place] or [place] as this is inconvenient for her, she lives in [place]. Provided her with [facility] office number. Advised her we could still medication refill once she had an appointment. She verbally understands and will call to make appointment in [place].

## 2018-09-10 NOTE — Telephone Encounter (Signed)
Has been given to Dr. Agustin Cree for him to review

## 2018-09-10 NOTE — Telephone Encounter (Signed)
Left message for patient to return call.. patient needs to be scheduled before refill

## 2018-10-01 ENCOUNTER — Other Ambulatory Visit: Payer: Self-pay | Admitting: Family Medicine

## 2018-10-01 MED ORDER — TRIAMTERENE-HCTZ 75-50 MG PO TABS: 1.0000 | ORAL_TABLET | ORAL | 2 refills | Status: DC

## 2018-10-01 NOTE — Telephone Encounter (Signed)
See note

## 2018-10-01 NOTE — Telephone Encounter (Signed)
Requested medication (s) are due for refill today: Yes  Requested medication (s) are on the active medication list: Yes  Last refill:  06/06/17  Future visit scheduled: Yes  Notes to clinic:  Historical provider    Requested Prescriptions  Pending Prescriptions Disp Refills   triamterene-hydrochlorothiazide (MAXZIDE) 75-50 MG tablet  2    Sig: Take 1 tablet by mouth See admin instructions. Take 1 tablet by mouth every other day alternating with 1/2 tablet the other days     Cardiovascular: Diuretic Combos Failed - 10/01/2018 10:02 AM      Failed - K in normal range and within 360 days    Potassium  Date Value Ref Range Status  06/14/2017 3.7 3.5 - 5.1 mmol/L Final    Comment:    DELTA CHECK NOTED         Failed - Na in normal range and within 360 days    Sodium  Date Value Ref Range Status  06/14/2017 136 135 - 145 mmol/L Final         Failed - Cr in normal range and within 360 days    Creatinine, Ser  Date Value Ref Range Status  06/14/2017 0.64 0.44 - 1.00 mg/dL Final         Failed - Ca in normal range and within 360 days    Calcium  Date Value Ref Range Status  06/14/2017 8.4 (L) 8.9 - 10.3 mg/dL Final         Passed - Last BP in normal range    BP Readings from Last 1 Encounters:  09/08/18 124/72         Passed - Valid encounter within last 6 months    Recent Outpatient Visits          3 weeks ago Essential hypertension   [place], Algis Greenhouse, MD      Future Appointments            In 1 month Vivi Barrack, MD [place], [facility]

## 2018-10-01 NOTE — Telephone Encounter (Signed)
Copied from [place] [id number]. Topic: Quick Communication - Rx Refill/Question >> Oct 01, 2018  9:54 AM Burchel, Abbi R wrote: Medication: triamterene-hydrochlorothiazide (MAXZIDE) 75-50 MG tablet  Pt requesting refill to get thru to CPE.   Preferred Pharmacy: [facility] [place], [place] [address] Phone: [phone] Fax: [phone] Not a 24 hour pharmacy; exact hours not known.   Pt was advised that RX refills may take up to 3 business days. We ask that you follow-up with your pharmacy.

## 2018-10-07 ENCOUNTER — Ambulatory Visit: Payer: Self-pay

## 2018-10-07 ENCOUNTER — Encounter: Payer: Self-pay | Admitting: Physician Assistant

## 2018-10-07 ENCOUNTER — Ambulatory Visit (INDEPENDENT_AMBULATORY_CARE_PROVIDER_SITE_OTHER): Payer: Medicare Other | Admitting: Physician Assistant

## 2018-10-07 ENCOUNTER — Other Ambulatory Visit: Payer: Self-pay | Admitting: Cardiology

## 2018-10-07 VITALS — BP 130/70 | HR 80 | Temp 98.6°F | Ht 66.0 in | Wt 157.2 lb

## 2018-10-07 DIAGNOSIS — J4520 Mild intermittent asthma, uncomplicated: Secondary | ICD-10-CM

## 2018-10-07 DIAGNOSIS — R6889 Other general symptoms and signs: Secondary | ICD-10-CM

## 2018-10-07 LAB — POC INFLUENZA A&B (BINAX/QUICKVUE)
INFLUENZA A, POC: NEGATIVE
INFLUENZA B, POC: NEGATIVE

## 2018-10-07 NOTE — Patient Instructions (Addendum)
It was great to see you!  You have a viral upper respiratory infection. Antibiotics are not needed for this.  Viral infections usually take 7-10 days to resolve.  The cough can last a few weeks to go away.  Please resume your flovent inhaler. Use albuterol as needed.  Push fluids and get plenty of rest. Please return if you are not improving as expected, or if you have high fevers (>101.5) or difficulty swallowing or worsening productive cough.  Call clinic with questions.  I hope you start feeling better soon!

## 2018-10-07 NOTE — Progress Notes (Signed)
Ashley Dyer is a 73 y.o. female here for a new problem.  I acted as a Education administrator for [employer], PA-C Anselmo Pickler, LPN  History of Present Illness:   Chief Complaint  Patient presents with  . Cough    x 6days, Chest congestion, light yellow flem    Cough  This is a new problem. Episode onset: Started last Wednesday, x 6 days. The problem has been gradually worsening. The problem occurs every few hours. The cough is productive of sputum (expectorating yellow sputum). Associated symptoms include chills, postnasal drip, a sore throat, shortness of breath and wheezing. Pertinent negatives include no ear congestion, ear pain, fever or headaches. Associated symptoms comments: Chest congestion. The symptoms are aggravated by lying down. She has tried nothing for the symptoms. Her past medical history is significant for asthma, bronchitis and pneumonia (walking ).   Patient has a history of asthma and the last time she saw her primary care provider, Dr. Jerline Pain, he wanted her to trial weaning off her inhalers.  She is currently not using her Flovent or her albuterol.  Past Medical History:  Diagnosis Date  . Allergy   . Anxiety    ED- visit for anxiety  . Arthritis    OA- shoulder(both) , hands  . Asthma   . Complication of anesthesia    fear- of feeling paralyzed - 1980's   . History of kidney stones 1980's   surgery  . Hyperlipidemia   . Hypertension   . 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 Marital status: Single    Spouse name: Not on file  . Number of children: Not on file  . Years of education: Not on file  . Highest education level: Not on file  Occupational History  . Not on file  Social Needs  . Financial resource strain: Not on file  . Food insecurity:    Worry: Not on file    Inability: Not on file  . Transportation needs:    Medical: Not on file    Non-medical: Not on file  Tobacco Use  . Smoking  status: Former Smoker    Last attempt to quit: 1990    Years since quitting: 30.1  . Smokeless tobacco: Never Used  Substance and Sexual Activity  . Alcohol use: Yes    Alcohol/week: 8.0 - 10.0 standard drinks    Types: 8 - 10 Glasses of wine per week  . Drug use: No  . Sexual activity: Not on file  Lifestyle  . Physical activity:    Days per week: Not on file    Minutes per session: Not on file  . Stress: Not on file  Relationships  . Social connections:    Talks on phone: Not on file    Gets together: Not on file    Attends religious service: Not on file    Active member of club or organization: Not on file    Attends meetings of clubs or organizations: Not on file    Relationship status: Not on file  . Intimate partner violence:    Fear of current or ex partner: Not on file    Emotionally abused: Not on file    Physically abused: Not on file    Forced sexual activity: Not on file  Other Topics Concern  . Not on file  Social History Narrative  . Not on file    Past Surgical History:  Procedure Laterality Date  . CATARACT EXTRACTION Bilateral   . KIDNEY STONE SURGERY    . TOTAL SHOULDER ARTHROPLASTY Right 06/13/2017  . TOTAL SHOULDER ARTHROPLASTY Right 06/13/2017   Procedure: TOTAL SHOULDER ARTHROPLASTY;  Surgeon: Tania Ade, MD;  Location: [facility];  Service: Orthopedics;  Laterality: Right;  Right total shoulder arthroplasty  . VAGINAL DELIVERY  1976    Family History  Problem Relation Age of Onset  . Atrial fibrillation Mother   . Heart disease Father   . Colon cancer Paternal Grandfather   . Leukemia Brother   . Breast cancer Neg Hx     No Known Allergies  Current Medications:   Current Outpatient Medications:  .  Ascorbic Acid (VITAMIN C) 1000 MG tablet, Take 1,000 mg by mouth daily., Disp: , Rfl:  .  CALCIUM CITRATE PO, Take 1 capsule by mouth daily., Disp: , Rfl:  .  Cholecalciferol (VITAMIN D3 PO), Take 1 capsule by mouth daily., Disp: , Rfl:  .   cromolyn (NASALCROM) 5.2 MG/ACT nasal spray, Place 1 spray into both nostrils daily as needed for allergies., Disp: , Rfl:  .  meloxicam (MOBIC) 15 MG tablet, Take 15 mg by mouth daily. with food, Disp: , Rfl:  .  PRALUENT 75 MG/ML SOAJ, INJECT 1 ML INTO THE SKIN EVERY 14 DAYS, Disp: 2 mL, Rfl: 0 .  triamterene-hydrochlorothiazide (MAXZIDE) 75-50 MG tablet, Take 1 tablet by mouth See admin instructions. Take 1 tablet by mouth every other day alternating with 1/2 tablet the other days, Disp: 30 tablet, Rfl: 2 .  albuterol (PROVENTIL HFA;VENTOLIN HFA) 108 (90 Base) MCG/ACT inhaler, Inhale 1 puff into the lungs every 6 (six) hours as needed for wheezing or shortness of breath., Disp: , Rfl:  .  estradiol (ESTRACE) 0.1 MG/GM vaginal cream, , Disp: , Rfl:  .  fluticasone (FLOVENT HFA) 110 MCG/ACT inhaler, Inhale 1 puff into the lungs 2 (two) times daily., Disp: , Rfl:    Review of Systems:   Review of Systems  Constitutional: Positive for chills. Negative for fever.  HENT: Positive for postnasal drip and sore throat. Negative for ear pain.   Respiratory: Positive for cough, shortness of breath and wheezing.   Neurological: Negative for headaches.    Vitals:   Vitals:   10/07/18 1055  BP: 130/70  Pulse: 80  Temp: 98.6 F (37 C)  TempSrc: Oral  SpO2: 96%  Weight: 157 lb 4 oz (71.3 kg)  Height: 5\' 6"  (1.676 m)     Body mass index is 25.38 kg/m.  Physical Exam:   Physical Exam Vitals signs and nursing note reviewed.  Constitutional:      General: She is not in acute distress.    Appearance: She is well-developed. She is not ill-appearing or toxic-appearing.  HENT:     Head: Normocephalic and atraumatic.     Right Ear: Tympanic membrane, ear canal and external ear normal. Tympanic membrane is not erythematous, retracted or bulging.     Left Ear: Tympanic membrane, ear canal and external ear normal. Tympanic membrane is not erythematous, retracted or bulging.     Nose: Mucosal  edema, congestion and rhinorrhea present.     Right Sinus: Frontal sinus tenderness present. No maxillary sinus tenderness.     Left Sinus: Frontal sinus tenderness present. No maxillary sinus tenderness.     Comments: Mild frontal sinus tenderness    Mouth/Throat:     Lips: Pink.     Mouth: Mucous membranes are moist.  Pharynx: Uvula midline. Posterior oropharyngeal erythema present.     Tonsils: Swelling: 0 on the right. 0 on the left.  Eyes:     General: Lids are normal.     Conjunctiva/sclera: Conjunctivae normal.  Neck:     Trachea: Trachea normal.  Cardiovascular:     Rate and Rhythm: Normal rate and regular rhythm.     Heart sounds: Normal heart sounds, S1 normal and S2 normal.  Pulmonary:     Effort: Pulmonary effort is normal.     Breath sounds: Normal breath sounds. No decreased breath sounds, wheezing, rhonchi or rales.  Lymphadenopathy:     Cervical: No cervical adenopathy.  Skin:    General: Skin is warm and dry.  Neurological:     Mental Status: She is alert.  Psychiatric:        Speech: Speech normal.        Behavior: Behavior normal. Behavior is cooperative.    Results for orders placed or performed in visit on 10/07/18  POC Influenza A&B(BINAX/QUICKVUE)  Result Value Ref Range   Influenza A, POC Negative Negative   Influenza B, POC Negative Negative     Assessment and Plan:   Porcia was seen today for cough.  Diagnoses and all orders for this visit:  Flu-like symptoms -     POC Influenza A&B(BINAX/QUICKVUE)  Mild intermittent asthma without complication  Flu test negative.  I do think she has a viral illness with a slight asthma flare.  She does not have any wheezing on today's exam, however I do want her to resume her Flovent.  I recommended that she continue this for about 2 weeks, and then reassess her symptoms and continue to stay on if she feels like she needs to.  I reviewed red flags with her.  She does not need antibiotic at this time.  Push fluids and rest. I recommend that patient follow-up if symptoms worsen or persist despite treatment x 7-10 days, sooner if needed.   . Reviewed expectations re: course of current medical issues. . Discussed self-management of symptoms. . Outlined signs and symptoms indicating need for more acute intervention. . Patient verbalized understanding and all questions were answered. . See orders for this visit as documented in the electronic medical record. . Patient received an After-Visit Summary.  CMA or LPN served as scribe during this visit. History, Physical, and Plan performed by medical provider. The above documentation has been reviewed and is accurate and complete.   Inda Coke, PA-C

## 2018-10-07 NOTE — Telephone Encounter (Signed)
Pt. Reports she started coughing last Wed. Cough is productive  - yellow mucus. Has wheezing. No fever. Was told by Dr. Jerline Pain to hold her Flovent. States she is going to use her rescue inhaler because her chest feels tight. Appointment made for today. Reason for Disposition . [1] Continuous (nonstop) coughing interferes with work or school AND [2] no improvement using cough treatment per Care Advice  Answer Assessment - Initial Assessment Questions 1. ONSET: "When did the cough begin?"      Started last Wed. 2. SEVERITY: "How bad is the cough today?"      Moderate 3. RESPIRATORY DISTRESS: "Describe your breathing."      Chest feels tight 4. FEVER: "Do you have a fever?" If so, ask: "What is your temperature, how was it measured, and when did it start?"     No 5. SPUTUM: "Describe the color of your sputum" (clear, white, yellow, green)     Yellow 6. HEMOPTYSIS: "Are you coughing up any blood?" If so ask: "How much?" (flecks, streaks, tablespoons, etc.)     No 7. CARDIAC HISTORY: "Do you have any history of heart disease?" (e.g., heart attack, congestive heart failure)      No 8. LUNG HISTORY: "Do you have any history of lung disease?"  (e.g., pulmonary embolus, asthma, emphysema)     Asthma 9. PE RISK FACTORS: "Do you have a history of blood clots?" (or: recent major surgery, recent prolonged travel, bedridden)     No 10. OTHER SYMPTOMS: "Do you have any other symptoms?" (e.g., runny nose, wheezing, chest pain)       Wheezing 11. PREGNANCY: "Is there any chance you are pregnant?" "When was your last menstrual period?"       No 12. TRAVEL: "Have you traveled out of the country in the last month?" (e.g., travel history, exposures)       No  Protocols used: [place]

## 2018-10-09 ENCOUNTER — Encounter: Payer: Self-pay | Admitting: Physician Assistant

## 2018-10-09 NOTE — Telephone Encounter (Signed)
Please see message and advise 

## 2018-10-10 ENCOUNTER — Ambulatory Visit (INDEPENDENT_AMBULATORY_CARE_PROVIDER_SITE_OTHER): Payer: Medicare Other | Admitting: Physician Assistant

## 2018-10-10 ENCOUNTER — Encounter: Payer: Self-pay | Admitting: Physician Assistant

## 2018-10-10 VITALS — BP 126/80 | HR 76 | Temp 98.8°F | Ht 66.0 in | Wt 158.2 lb

## 2018-10-10 DIAGNOSIS — R05 Cough: Secondary | ICD-10-CM

## 2018-10-10 DIAGNOSIS — R059 Cough, unspecified: Secondary | ICD-10-CM

## 2018-10-10 MED ORDER — AZITHROMYCIN 250 MG PO TABS: ORAL_TABLET | ORAL | 0 refills | Status: DC

## 2018-10-10 NOTE — Patient Instructions (Signed)
It was great to see you! Azithromycin antibiotic has been sent in if you need it as we discussed in the office.  Upper respiratory infection recommendations for those with current or history of elevated blood pressure: 1. Avoid all over-the-counter antihistamines except Claritin/Loratadine and Zyrtec/Cetrizine. 2. Avoid all combination including cold sinus allergies flu decongestant and sleep medications 3. You can use Robitussin DM Mucinex and Mucinex DM for cough.  Push fluids and get plenty of rest. Please return if you are not improving as expected, or if you have high fevers (>101.5) or difficulty swallowing or worsening productive cough.  Call clinic with questions.  I hope you start feeling better soon!

## 2018-10-10 NOTE — Progress Notes (Signed)
Ashley Dyer is a 73 y.o. female is here to follow up on cough.  I acted as a Education administrator for [employer], PA-C Anselmo Pickler, LPN  History of Present Illness:   Chief Complaint  Patient presents with  . Cough    Cough  This is a recurrent problem. Episode onset: Started 8 days ago, was seen on 3/3 Dx with URI. The problem has been gradually improving. The problem occurs every few hours. The cough is non-productive. Associated symptoms include nasal congestion (clear drainage now) and shortness of breath. Pertinent negatives include no chills, fever, headaches or sore throat. Associated symptoms comments: Chest congestion.. Treatments tried: Corciden HP for cough and cold. The treatment provided mild relief. Her past medical history is significant for asthma, bronchitis and pneumonia.   She overall feels improved however the only thing that is truly bothering her at this point as she feels like she can get her mucus out of her chest.  Health Maintenance Due  Topic Date Due  . Hepatitis C Screening  May 16, 1946  . COLONOSCOPY  09/22/1995    Past Medical History:  Diagnosis Date  . Allergy   . Anxiety    ED- visit for anxiety  . Arthritis    OA- shoulder(both) , hands  . Asthma   . Complication of anesthesia    fear- of feeling paralyzed - 1980's   . History of kidney stones 1980's   surgery  . Hyperlipidemia   . Hypertension   . 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 Marital status: Single    Spouse name: Not on file  . Number of children: Not on file  . Years of education: Not on file  . Highest education level: Not on file  Occupational History  . Not on file  Social Needs  . Financial resource strain: Not on file  . Food insecurity:    Worry: Not on file    Inability: Not on file  . Transportation needs:    Medical: Not on file    Non-medical: Not on file  Tobacco Use  . Smoking status: Former Smoker     Last attempt to quit: 1990    Years since quitting: 30.1  . Smokeless tobacco: Never Used  Substance and Sexual Activity  . Alcohol use: Yes    Alcohol/week: 8.0 - 10.0 standard drinks    Types: 8 - 10 Glasses of wine per week  . Drug use: No  . Sexual activity: Not on file  Lifestyle  . Physical activity:    Days per week: Not on file    Minutes per session: Not on file  . Stress: Not on file  Relationships  . Social connections:    Talks on phone: Not on file    Gets together: Not on file    Attends religious service: Not on file    Active member of club or organization: Not on file    Attends meetings of clubs or organizations: Not on file    Relationship status: Not on file  . Intimate partner violence:    Fear of current or ex partner: Not on file    Emotionally abused: Not on file    Physically abused: Not on file    Forced sexual activity: Not on file  Other Topics Concern  . Not on file  Social History Narrative  . Not on file    Past Surgical History:  Procedure Laterality Date  . CATARACT EXTRACTION Bilateral   . KIDNEY STONE SURGERY    . TOTAL SHOULDER ARTHROPLASTY Right 06/13/2017  . TOTAL SHOULDER ARTHROPLASTY Right 06/13/2017   Procedure: TOTAL SHOULDER ARTHROPLASTY;  Surgeon: Tania Ade, MD;  Location: [facility];  Service: Orthopedics;  Laterality: Right;  Right total shoulder arthroplasty  . VAGINAL DELIVERY  1976    Family History  Problem Relation Age of Onset  . Atrial fibrillation Mother   . Heart disease Father   . Colon cancer Paternal Grandfather   . Leukemia Brother   . Breast cancer Neg Hx     PMHx, SurgHx, SocialHx, FamHx, Medications, and Allergies were reviewed in the Visit Navigator and updated as appropriate.   Patient Active Problem List   Diagnosis Date Noted  . Essential hypertension 09/08/2018  . Dyslipidemia 09/08/2018  . Allergic rhinitis 09/08/2018  . Mild intermittent asthma without complication 73/41/9379  .  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    Social History   Tobacco Use  . Smoking status: Former Smoker    Last attempt to quit: 1990    Years since quitting: 30.1  . Smokeless tobacco: Never Used  Substance Use Topics  . Alcohol use: Yes    Alcohol/week: 8.0 - 10.0 standard drinks    Types: 8 - 10 Glasses of wine per week  . Drug use: No    Current Medications and Allergies:    Current Outpatient Medications:  .  albuterol (PROVENTIL HFA;VENTOLIN HFA) 108 (90 Base) MCG/ACT inhaler, Inhale 1 puff into the lungs every 6 (six) hours as needed for wheezing or shortness of breath., Disp: , Rfl:  .  Ascorbic Acid (VITAMIN C) 1000 MG tablet, Take 1,000 mg by mouth daily., Disp: , Rfl:  .  CALCIUM CITRATE PO, Take 1 capsule by mouth daily., Disp: , Rfl:  .  Cholecalciferol (VITAMIN D3 PO), Take 1 capsule by mouth daily., Disp: , Rfl:  .  cromolyn (NASALCROM) 5.2 MG/ACT nasal spray, Place 1 spray into both nostrils daily as needed for allergies., Disp: , Rfl:  .  estradiol (ESTRACE) 0.1 MG/GM vaginal cream, , Disp: , Rfl:  .  fluticasone (FLOVENT HFA) 110 MCG/ACT inhaler, Inhale 1 puff into the lungs 2 (two) times daily., Disp: , Rfl:  .  meloxicam (MOBIC) 15 MG tablet, Take 15 mg by mouth daily. with food, Disp: , Rfl:  .  PRALUENT 75 MG/ML SOAJ, INJECT 1  SUBCUTANEOUSLY EVERY 14 DAYS, Disp: 2 mL, Rfl: 0 .  triamterene-hydrochlorothiazide (MAXZIDE) 75-50 MG tablet, Take 1 tablet by mouth See admin instructions. Take 1 tablet by mouth every other day alternating with 1/2 tablet the other days, Disp: 30 tablet, Rfl: 2 .  azithromycin (ZITHROMAX) 250 MG tablet, Take 2 tablets on day 1, then one tablet daily x 4 days, Disp: 6 tablet, Rfl: 0  No Known Allergies  Review of Systems   Review of Systems  Constitutional: Negative for chills and fever.  HENT: Negative for sore throat.   Respiratory: Positive for cough and shortness of breath.   Neurological: Negative for  headaches.    Vitals:   Vitals:   10/10/18 1448  BP: 126/80  Pulse: 76  Temp: 98.8 F (37.1 C)  TempSrc: Oral  SpO2: 97%  Weight: 158 lb 4 oz (71.8 kg)  Height: 5\' 6"  (1.676 m)     Body mass index is 25.54 kg/m.   Physical Exam:    Physical Exam Vitals signs and nursing note reviewed.  Constitutional:      General: She is not in acute distress.    Appearance: She is well-developed. She is not ill-appearing or toxic-appearing.  HENT:     Head: Normocephalic and atraumatic.     Right Ear: Tympanic membrane, ear canal and external ear normal. Tympanic membrane is not erythematous, retracted or bulging.     Left Ear: Tympanic membrane, ear canal and external ear normal. Tympanic membrane is not erythematous, retracted or bulging.     Ears:     Comments: Bilateral ears with clear fluid behind TMs    Nose: Nose normal.     Right Sinus: No maxillary sinus tenderness or frontal sinus tenderness.     Left Sinus: No maxillary sinus tenderness or frontal sinus tenderness.     Mouth/Throat:     Pharynx: Uvula midline. No posterior oropharyngeal erythema.  Eyes:     General: Lids are normal.     Conjunctiva/sclera: Conjunctivae normal.  Neck:     Trachea: Trachea normal.  Cardiovascular:     Rate and Rhythm: Normal rate and regular rhythm.     Heart sounds: Normal heart sounds, S1 normal and S2 normal.  Pulmonary:     Effort: Pulmonary effort is normal.     Breath sounds: Normal breath sounds. No decreased breath sounds, wheezing, rhonchi or rales.  Lymphadenopathy:     Cervical: No cervical adenopathy.  Skin:    General: Skin is warm and dry.  Neurological:     Mental Status: She is alert.  Psychiatric:        Speech: Speech normal.        Behavior: Behavior normal. Behavior is cooperative.      Assessment and Plan:    Aidaly was seen today for cough.  Diagnoses and all orders for this visit:  Cough  Other orders -     azithromycin (ZITHROMAX) 250 MG  tablet; Take 2 tablets on day 1, then one tablet daily x 4 days   No red flags on exam.  Suspect post-viral cough.  She and I discussed possibly starting Mucinex to help with her chest congestion.  I did give her a pocket prescription of azithromycin should her symptoms not improve or if they worsen.  iscussed taking medications as prescribed. Reviewed return precautions including worsening fever, SOB, worsening cough or other concerns. Push fluids and rest. I recommend that patient follow-up if symptoms worsen or persist despite treatment x 7-10 days, sooner if needed.   . Reviewed expectations re: course of current medical issues. . Discussed self-management of symptoms. . Outlined signs and symptoms indicating need for more acute intervention. . Patient verbalized understanding and all questions were answered. . See orders for this visit as documented in the electronic medical record. . Patient received an After Visit Summary.  CMA or LPN served as scribe during this visit. History, Physical, and Plan performed by medical provider. The above documentation has been reviewed and is accurate and complete.   Inda Coke, PA-C [place], [place] 10/10/2018  Follow-up: No follow-ups on file.

## 2018-10-14 ENCOUNTER — Encounter: Payer: Self-pay | Admitting: Family Medicine

## 2018-10-23 ENCOUNTER — Encounter: Payer: Self-pay | Admitting: Family Medicine

## 2018-10-30 ENCOUNTER — Telehealth: Payer: Self-pay

## 2018-10-30 ENCOUNTER — Telehealth: Payer: Self-pay | Admitting: Cardiovascular Disease

## 2018-10-30 DIAGNOSIS — E785 Hyperlipidemia, unspecified: Secondary | ICD-10-CM

## 2018-10-30 MED ORDER — ALIROCUMAB 75 MG/ML ~~LOC~~ SOAJ: 75.0000 mg | SUBCUTANEOUS | 11 refills | Status: DC

## 2018-10-30 NOTE — Telephone Encounter (Signed)
RX refill sent as requested.  

## 2018-10-30 NOTE — Telephone Encounter (Signed)
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 Which medications need to be refilled? (please list name of each medication and dose if known) PRALUENT 75 MG/ML SOAJ  2. Which pharmacy/location (including street and city if local pharmacy) is medication to be sent to? [facility] located in [place] at [address] [phone]   3. Do they need a 30 day or 90 day supply? [place]

## 2018-10-30 NOTE — Telephone Encounter (Signed)
Left message for pt to call back about appt. 

## 2018-10-31 NOTE — Telephone Encounter (Signed)
Will postpone patient's appointment to summer 2020

## 2018-11-04 ENCOUNTER — Ambulatory Visit: Payer: Medicare Other | Admitting: Cardiovascular Disease

## 2018-11-18 ENCOUNTER — Ambulatory Visit: Payer: Medicare Other | Admitting: Cardiovascular Disease

## 2018-11-18 ENCOUNTER — Encounter: Payer: Medicare Other | Admitting: Family Medicine

## 2019-01-01 ENCOUNTER — Other Ambulatory Visit: Payer: Self-pay | Admitting: Orthopedic Surgery

## 2019-01-02 ENCOUNTER — Telehealth: Payer: Self-pay | Admitting: *Deleted

## 2019-01-02 NOTE — Telephone Encounter (Signed)
   [facility] Pre-operative Risk Assessment    Request for surgical clearance:  1. What type of surgery is being performed? LEFT TOTAL SHOULDER REPLACEMENT    2. When is this surgery scheduled? TBD  3. What type of clearance is required (medical clearance vs. Pharmacy clearance to hold med vs. Both)? MEDICAL  4. Are there any medications that need to be held prior to surgery and how long? NONE LISTED  5. Practice name and name of physician performing surgery? [facility]; DR. Larkin Ina CHANDLER  6.   What is your office phone number [phone]   7.   What is your office fax number [phone]  8.   Anesthesia type (None, local, MAC, general) ? GENERAL ?    Ashley Dyer 01/02/2019, 12:34 PM  _________________________________________________________________   (provider comments below)

## 2019-01-05 ENCOUNTER — Other Ambulatory Visit: Payer: Self-pay

## 2019-01-05 ENCOUNTER — Encounter: Payer: Self-pay | Admitting: Family Medicine

## 2019-01-05 ENCOUNTER — Ambulatory Visit (INDEPENDENT_AMBULATORY_CARE_PROVIDER_SITE_OTHER): Payer: Medicare Other | Admitting: Family Medicine

## 2019-01-05 VITALS — BP 122/74 | HR 77 | Temp 98.3°F | Ht 66.0 in | Wt 159.0 lb

## 2019-01-05 DIAGNOSIS — M1990 Unspecified osteoarthritis, unspecified site: Secondary | ICD-10-CM | POA: Diagnosis not present

## 2019-01-05 DIAGNOSIS — E785 Hyperlipidemia, unspecified: Secondary | ICD-10-CM

## 2019-01-05 DIAGNOSIS — J4520 Mild intermittent asthma, uncomplicated: Secondary | ICD-10-CM | POA: Diagnosis not present

## 2019-01-05 DIAGNOSIS — Z0001 Encounter for general adult medical examination with abnormal findings: Secondary | ICD-10-CM | POA: Diagnosis not present

## 2019-01-05 DIAGNOSIS — J309 Allergic rhinitis, unspecified: Secondary | ICD-10-CM

## 2019-01-05 DIAGNOSIS — I10 Essential (primary) hypertension: Secondary | ICD-10-CM | POA: Diagnosis not present

## 2019-01-05 MED ORDER — TRIAMTERENE-HCTZ 75-50 MG PO TABS: 1.0000 | ORAL_TABLET | ORAL | 0 refills | Status: DC

## 2019-01-05 NOTE — Progress Notes (Signed)
Cardiology Office Note   Date:  01/06/2019   ID:  Ashley Dyer, DOB November 12, 1945, MRN [id number]  PCP:  Vivi Barrack, MD  Cardiologist: Cathie Olden   Chief Compliant:  Pre-Operative Evaluation    History of Present Illness: Ashley Dyer is a 73 y.o. female with a past medical history significant for hypertension, hyperlipidemia, asthma, right shoulder replacement 2018. She has been followed by Dr Wynonia Lawman in the past.   She is being seen today for cardiac clearance for left total shoulder replacement under general anesthesia. Dr.Justin Boeing with [employer]. Date is to be determined. Fax number [phone].   She has not had any issues with dyspnea on exertion, chest pain or fatigue. She is limited by some arthritis in her knees and pain in her shoulder concerning exercise. She likes to swim and to walk. She continues walking daily.   She has intolerance to statins and is now on Praluent injections Q 2 weeks. Follow up labs are due and will be drawn with her pre-operative labs.    Past Medical History:  Diagnosis Date  . Allergy   . Anxiety    ED- visit for anxiety  . Arthritis    OA- shoulder(both) , hands  . Asthma   . Complication of anesthesia    fear- of feeling paralyzed - 1980's   . History of kidney stones 1980's   surgery  . Hyperlipidemia   . Hypertension   . 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 CATARACT EXTRACTION Bilateral   . KIDNEY STONE SURGERY    . TOTAL SHOULDER ARTHROPLASTY Right 06/13/2017  . TOTAL SHOULDER ARTHROPLASTY Right 06/13/2017   Procedure: TOTAL SHOULDER ARTHROPLASTY;  Surgeon: Tania Ade, MD;  Location: [place];  Service: Orthopedics;  Laterality: Right;  Right total shoulder arthroplasty  . VAGINAL DELIVERY  1976     Current Outpatient Medications  Medication Sig Dispense Refill  . albuterol (PROVENTIL HFA;VENTOLIN HFA) 108 (90 Base) MCG/ACT inhaler Inhale 1  puff into the lungs every 6 (six) hours as needed for wheezing or shortness of breath.    . Alirocumab (PRALUENT) 75 MG/ML SOAJ Inject 75 mg as directed every 14 (fourteen) days. 2 mL 11  . Ascorbic Acid (VITAMIN C) 1000 MG tablet Take 1,000 mg by mouth daily.    Marland Kitchen CALCIUM CITRATE PO Take 1 capsule by mouth daily.    . Cholecalciferol (VITAMIN D3 PO) Take 1 capsule by mouth daily.    . cromolyn (NASALCROM) 5.2 MG/ACT nasal spray Place 1 spray into both nostrils daily as needed for allergies.    Marland Kitchen estradiol (ESTRACE) 0.1 MG/GM vaginal cream     . fluticasone (FLOVENT HFA) 110 MCG/ACT inhaler Inhale 1 puff into the lungs 2 (two) times daily.    . meloxicam (MOBIC) 15 MG tablet Take 15 mg by mouth daily. with food    . triamterene-hydrochlorothiazide (MAXZIDE) 75-50 MG tablet Take 1 tablet by mouth See admin instructions. Take 1 tablet by mouth every other day alternating with 1/2 tablet the other days 30 tablet 0   No current facility-administered medications for this visit.     Allergies:   Patient has no known allergies.    Social History:  The patient  reports that she quit smoking about 30 years ago. She has never used smokeless tobacco. She reports current alcohol use of about 8.0 - 10.0 standard drinks of alcohol per week. She reports that she does not  use drugs.   Family History:  The patient's family history includes Atrial fibrillation in her mother; Colon cancer in her paternal grandfather; Heart disease in her father; Leukemia in her brother.    ROS: All other systems are reviewed and negative. Unless otherwise mentioned in H&P    PHYSICAL EXAM: VS:  BP (!) 148/52   Pulse 73   Temp (!) 97.1 F (36.2 C)   Ht 5\' 6"  (1.676 m)   Wt 158 lb 3.2 oz (71.8 kg)   BMI 25.53 kg/m  , BMI Body mass index is 25.53 kg/m. GEN: Well nourished, well developed, in no acute distress HEENT: normal Neck: no JVD, soft right carotid bruits, or masses Cardiac: RRR; soft 1/6 murmur RSB, rubs, or  gallops,no edema  Respiratory:  Clear to auscultation bilaterally, normal work of breathing GI: soft, nontender, nondistended, + BS MS: no deformity or atrophy Skin: warm and dry, no rash Neuro:  Strength and sensation are intact Psych: euthymic mood, full affect   EKG: NSR with questionable anterior abnormality. Rate of 73 bpm.   Recent Labs: No results found for requested labs within last 8760 hours.    Lipid Panel    Component Value Date/Time   CHOL 252 (A) 05/28/2017   TRIG 174 (A) 05/28/2017   HDL 72 (A) 05/28/2017   LDLCALC 145 05/28/2017   Lipid Panel 11/13/2017   TC 187 LDL 74 HDL 73 TG   196  Wt Readings from Last 3 Encounters:  01/06/19 158 lb 3.2 oz (71.8 kg)  01/05/19 159 lb (72.1 kg)  10/10/18 158 lb 4 oz (71.8 kg)      Other studies Reviewed: Cardiac CTA 07/24/2017 (transcribed from report which was faxed). Coronary calcifications within the LAD, Circumflex and and Left main  and RCA.Annualr calcifications at the Aortic root. MESA database percentile 92 nd percentile.   Total Agatston Score of 559.   ASSESSMENT AND PLAN:  1.  Pre-Operative Clearance:   Chart reviewed as part of pre-operative protocol coverage. Given past medical history and time since last visit, based on ACC/AHA guidelines, Ashley Dyer would be at acceptable risk for the planned procedure without further cardiovascular testing.   2. CAD: Cardiac CTA was abnormal with 3 vessel disease. Would recommend follow up stress test of evidence of ischemia verses repeat CTA, with CVRF of hypertension, age, FH, HL. She is due to see Dr Acie Fredrickson in one month.  She is asymptomatic and remains active.    3. Hypertension: BP is slightly elevated today but normally in 133-138/ 70's.  Will not make any medication adjustments at this time. I have read PCP notes discussing changing to ARB or ACE on follow up after surgery. I agree that we do not need to change any medications pre-operatively at this  time, but certainly would consider this change on next visit.   4. Hypercholesterolemia: She is now on injectable Praluent  Q 2 weeks. Most recent labs above in 11/2018 with LDL of 73.   Phill Myron. Lawrence DNP, ANP, AACC  01/06/2019, 10:05 AM   Current medicines are reviewed at length with the patient today.    Labs/ tests ordered today include: None   Phill Myron. West Pugh, ANP, AACC   01/06/2019 9:52 AM    [place] [address] Office [phone] Fax [phone]

## 2019-01-05 NOTE — Assessment & Plan Note (Signed)
Stable. Continue flovent seasonally and albuterol as needed.

## 2019-01-05 NOTE — Assessment & Plan Note (Signed)
Stable. Recommended voltaren gel for hand OA. Otherwise continue management per orthopedics.

## 2019-01-05 NOTE — Assessment & Plan Note (Signed)
Stable.  Continue coagulant per cardiology.  Will have lipid panel checked soon.

## 2019-01-05 NOTE — Assessment & Plan Note (Signed)
Stable. Discussed changing BP meds today, however given her upcoming surgery, will continue current dose for the time being.  After she completes her surgery and goes to rehab, would consider trial of alternative regimen such as amlodipine or ACE/ARB.

## 2019-01-05 NOTE — Progress Notes (Signed)
Chief Complaint:  Ashley Dyer is a 73 y.o. female who presents today for her annual comprehensive physical exam.    Assessment/Plan:  Osteoarthritis s/p right shoulder replacement Stable. Recommended voltaren gel for hand OA. Otherwise continue management per orthopedics.   Mild intermittent asthma without complication Stable. Continue flovent seasonally and albuterol as needed.   Essential hypertension Stable. Discussed changing BP meds today, however given her upcoming surgery, will continue current dose for the time being.  After she completes her surgery and goes to rehab, would consider trial of alternative regimen such as amlodipine or ACE/ARB.   Dyslipidemia Stable.  Continue coagulant per cardiology.  Will have lipid panel checked soon.  Allergic rhinitis Stable.  Continue Nasalcrom daily as needed.   Preventative Healthcare: Up to date on vaccines and screenings.   Patient Counseling(The following topics were reviewed and/or handout was given):  -Nutrition: Stressed importance of moderation in sodium/caffeine intake, saturated fat and cholesterol, caloric balance, sufficient intake of fresh fruits, vegetables, and fiber.  -Stressed the importance of regular exercise.   -Substance Abuse: Discussed cessation/primary prevention of tobacco, alcohol, or other drug use; driving or other dangerous activities under the influence; availability of treatment for abuse.   -Injury prevention: Discussed safety belts, safety helmets, smoke detector, smoking near bedding or upholstery.   -Sexuality: Discussed sexually transmitted diseases, partner selection, use of condoms, avoidance of unintended pregnancy and contraceptive alternatives.   -Dental health: Discussed importance of regular tooth brushing, flossing, and dental visits.  -Health maintenance and immunizations reviewed. Please refer to Health maintenance section.  Return to care in 1 year for next preventative visit.     Subjective:  HPI:  She has no acute complaints today.   She will be undergoing shoulder surgery in a few weeks and has cardiology visit scheduled tomorrow for clearance.  Her stable, chronic medical conditions are outlined below:  # Essential Hypertension -On triamterene-HCTZ 75-50.  Takes 1 tablet by mouth every other day alternating with half tablet the other days.  Tolerates this well without side effects. - ROS: No reported chest pain or shortness of breath.  # Dyslipidemia - On praluent and tolerating well.  # Allergic Rhinitis / Asthma - On Nasalcrom daily and tolerating well. - On flovent daily seasonally as needed and tolerating well. Uses albuterol as needed.   # Osteoarthritis - Follows with orthopedics - Dr Tamera Punt - Takes mobic as needed.  Lifestyle Diet: No specific diets or eating plans.  Exercise: Does a lot of walking.   Depression screen PHQ 2/9 01/05/2019  Decreased Interest 0  Down, Depressed, Hopeless 0  PHQ - 2 Score 0    Health Maintenance Due  Topic Date Due  . Hepatitis C Screening  1945-12-01     ROS: Per HPI, otherwise a complete review of systems was negative.   PMH:  The following were reviewed and entered/updated in epic: Past Medical History:  Diagnosis Date  . Allergy   . Anxiety    ED- visit for anxiety  . Arthritis    OA- shoulder(both) , hands  . Asthma   . Complication of anesthesia    fear- of feeling paralyzed - 1980's   . History of kidney stones 1980's   surgery  . Hyperlipidemia   . Hypertension   . 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 Essential hypertension 09/08/2018  . Dyslipidemia 09/08/2018  . Allergic rhinitis 09/08/2018  . Mild intermittent  asthma without complication 97/98/9211  . 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   Past Surgical History:  Procedure Laterality Date  . CATARACT EXTRACTION Bilateral   . KIDNEY  STONE SURGERY    . TOTAL SHOULDER ARTHROPLASTY Right 06/13/2017  . TOTAL SHOULDER ARTHROPLASTY Right 06/13/2017   Procedure: TOTAL SHOULDER ARTHROPLASTY;  Surgeon: Tania Ade, MD;  Location: [facility];  Service: Orthopedics;  Laterality: Right;  Right total shoulder arthroplasty  . VAGINAL DELIVERY  1976    Family History  Problem Relation Age of Onset  . Atrial fibrillation Mother   . Heart disease Father   . Colon cancer Paternal Grandfather   . Leukemia Brother   . Breast cancer Neg Hx     Medications- reviewed and updated Current Outpatient Medications  Medication Sig Dispense Refill  . albuterol (PROVENTIL HFA;VENTOLIN HFA) 108 (90 Base) MCG/ACT inhaler Inhale 1 puff into the lungs every 6 (six) hours as needed for wheezing or shortness of breath.    . Alirocumab (PRALUENT) 75 MG/ML SOAJ Inject 75 mg as directed every 14 (fourteen) days. 2 mL 11  . Ascorbic Acid (VITAMIN C) 1000 MG tablet Take 1,000 mg by mouth daily.    Marland Kitchen CALCIUM CITRATE PO Take 1 capsule by mouth daily.    . Cholecalciferol (VITAMIN D3 PO) Take 1 capsule by mouth daily.    . cromolyn (NASALCROM) 5.2 MG/ACT nasal spray Place 1 spray into both nostrils daily as needed for allergies.    Marland Kitchen estradiol (ESTRACE) 0.1 MG/GM vaginal cream     . fluticasone (FLOVENT HFA) 110 MCG/ACT inhaler Inhale 1 puff into the lungs 2 (two) times daily.    . meloxicam (MOBIC) 15 MG tablet Take 15 mg by mouth daily. with food    . triamterene-hydrochlorothiazide (MAXZIDE) 75-50 MG tablet Take 1 tablet by mouth See admin instructions. Take 1 tablet by mouth every other day alternating with 1/2 tablet the other days 30 tablet 0   No current facility-administered medications for this visit.     Allergies-reviewed and updated No Known Allergies  Social History   Socioeconomic History  . Marital status: Single    Spouse name: Not on file  . Number of children: Not on file  . Years of education: Not on file  . Highest education  level: Not on file  Occupational History  . Not on file  Social Needs  . Financial resource strain: Not on file  . Food insecurity:    Worry: Not on file    Inability: Not on file  . Transportation needs:    Medical: Not on file    Non-medical: Not on file  Tobacco Use  . Smoking status: Former Smoker    Last attempt to quit: 1990    Years since quitting: 30.4  . Smokeless tobacco: Never Used  Substance and Sexual Activity  . Alcohol use: Yes    Alcohol/week: 8.0 - 10.0 standard drinks    Types: 8 - 10 Glasses of wine per week  . Drug use: No  . Sexual activity: Not on file  Lifestyle  . Physical activity:    Days per week: Not on file    Minutes per session: Not on file  . Stress: Not on file  Relationships  . Social connections:    Talks on phone: Not on file    Gets together: Not on file    Attends religious service: Not on file    Active member of club or organization: Not on file  Attends meetings of clubs or organizations: Not on file    Relationship status: Not on file  Other Topics Concern  . Not on file  Social History Narrative  . Not on file        Objective:  Physical Exam: BP 122/74 (BP Location: Right Arm, Patient Position: Sitting, Cuff Size: Normal)   Pulse 77   Temp 98.3 F (36.8 C) (Oral)   Ht 5\' 6"  (1.676 m)   Wt 159 lb (72.1 kg)   SpO2 99%   BMI 25.66 kg/m   Body mass index is 25.66 kg/m. Wt Readings from Last 3 Encounters:  01/05/19 159 lb (72.1 kg)  10/10/18 158 lb 4 oz (71.8 kg)  10/07/18 157 lb 4 oz (71.3 kg)   Gen: NAD, resting comfortably HEENT: TMs normal bilaterally. OP clear. No thyromegaly noted.  CV: RRR with no murmurs appreciated Pulm: NWOB, CTAB with no crackles, wheezes, or rhonchi GI: Normal bowel sounds present. Soft, Nontender, Nondistended. MSK: no edema, cyanosis, or clubbing noted Skin: warm, dry Neuro: CN2-12 grossly intact. Strength 5/5 in upper and lower extremities. Reflexes symmetric and intact  bilaterally.  Psych: Normal affect and thought content     Caleb M. Jerline Pain, MD 01/05/2019 10:28 AM

## 2019-01-05 NOTE — Patient Instructions (Signed)
It was very nice to see you today!  Keep up the good work!  No changes today. Let me know when you want to talk about changing BP meds after your surgery.   Eat at least 3 REAL meals and 1-2 snacks per day.  Aim for no more than 5 hours between eating.  Eat breakfast within one hour of getting up.    Obtain twice as many fruits/vegetables as protein or carbohydrate foods for both lunch and dinner.   Cut down on sweet beverages. This includes juice, soda, and sweet tea.    Exercise at least 150 minutes every week.    Take care, Dr Jerline Pain   Preventive Care 26 Years and Older, Female Preventive care refers to lifestyle choices and visits with your health care provider that can promote health and wellness. What does preventive care include?  A yearly physical exam. This is also called an annual well check.  Dental exams once or twice a year.  Routine eye exams. Ask your health care provider how often you should have your eyes checked.  Personal lifestyle choices, including: ? Daily care of your teeth and gums. ? Regular physical activity. ? Eating a healthy diet. ? Avoiding tobacco and drug use. ? Limiting alcohol use. ? Practicing safe sex. ? Taking low-dose aspirin every day. ? Taking vitamin and mineral supplements as recommended by your health care provider. What happens during an annual well check? The services and screenings done by your health care provider during your annual well check will depend on your age, overall health, lifestyle risk factors, and family history of disease. Counseling Your health care provider may ask you questions about your:  Alcohol use.  Tobacco use.  Drug use.  Emotional well-being.  Home and relationship well-being.  Sexual activity.  Eating habits.  History of falls.  Memory and ability to understand (cognition).  Work and work Statistician.  Reproductive health.  Screening You may have the following tests or  measurements:  Height, weight, and BMI.  Blood pressure.  Lipid and cholesterol levels. These may be checked every 5 years, or more frequently if you are over 52 years old.  Skin check.  Lung cancer screening. You may have this screening every year starting at age 22 if you have a 30-pack-year history of smoking and currently smoke or have quit within the past 15 years.  Colorectal cancer screening. All adults should have this screening starting at age 23 and continuing until age 20. You will have tests every 1-10 years, depending on your results and the type of screening test. People at increased risk should start screening at an earlier age. Screening tests may include: ? Guaiac-based fecal occult blood testing. ? Fecal immunochemical test (FIT). ? Stool DNA test. ? Virtual colonoscopy. ? Sigmoidoscopy. During this test, a flexible tube with a tiny camera (sigmoidoscope) is used to examine your rectum and lower colon. The sigmoidoscope is inserted through your anus into your rectum and lower colon. ? Colonoscopy. During this test, a long, thin, flexible tube with a tiny camera (colonoscope) is used to examine your entire colon and rectum.  Hepatitis C blood test.  Hepatitis B blood test.  Sexually transmitted disease (STD) testing.  Diabetes screening. This is done by checking your blood sugar (glucose) after you have not eaten for a while (fasting). You may have this done every 1-3 years.  Bone density scan. This is done to screen for osteoporosis. You may have this done starting at age  65.  Mammogram. This may be done every 1-2 years. Talk to your health care provider about how often you should have regular mammograms. Talk with your health care provider about your test results, treatment options, and if necessary, the need for more tests. Vaccines Your health care provider may recommend certain vaccines, such as:  Influenza vaccine. This is recommended every year.  Tetanus,  diphtheria, and acellular pertussis (Tdap, Td) vaccine. You may need a Td booster every 10 years.  Varicella vaccine. You may need this if you have not been vaccinated.  Zoster vaccine. You may need this after age 16.  Measles, mumps, and rubella (MMR) vaccine. You may need at least one dose of MMR if you were born in 1957 or later. You may also need a second dose.  Pneumococcal 13-valent conjugate (PCV13) vaccine. One dose is recommended after age 44.  Pneumococcal polysaccharide (PPSV23) vaccine. One dose is recommended after age 16.  Meningococcal vaccine. You may need this if you have certain conditions.  Hepatitis A vaccine. You may need this if you have certain conditions or if you travel or work in places where you may be exposed to hepatitis A.  Hepatitis B vaccine. You may need this if you have certain conditions or if you travel or work in places where you may be exposed to hepatitis B.  Haemophilus influenzae type b (Hib) vaccine. You may need this if you have certain conditions. Talk to your health care provider about which screenings and vaccines you need and how often you need them. This information is not intended to replace advice given to you by your health care provider. Make sure you discuss any questions you have with your health care provider. Document Released: 08/19/2015 Document Revised: 09/12/2017 Document Reviewed: 05/24/2015 Elsevier Interactive Patient Education  2019 [employer].

## 2019-01-05 NOTE — Assessment & Plan Note (Signed)
Stable.  Continue Nasalcrom daily as needed.

## 2019-01-06 ENCOUNTER — Encounter: Payer: Self-pay | Admitting: Adult Health

## 2019-01-06 ENCOUNTER — Ambulatory Visit: Payer: Medicare Other | Admitting: Adult Health

## 2019-01-06 VITALS — BP 148/52 | HR 73 | Temp 97.1°F | Ht 66.0 in | Wt 158.2 lb

## 2019-01-06 DIAGNOSIS — E7800 Pure hypercholesterolemia, unspecified: Secondary | ICD-10-CM

## 2019-01-06 DIAGNOSIS — Z01810 Encounter for preprocedural cardiovascular examination: Secondary | ICD-10-CM | POA: Diagnosis not present

## 2019-01-06 DIAGNOSIS — I2510 Atherosclerotic heart disease of native coronary artery without angina pectoris: Secondary | ICD-10-CM

## 2019-01-06 DIAGNOSIS — I10 Essential (primary) hypertension: Secondary | ICD-10-CM

## 2019-01-06 NOTE — Patient Instructions (Signed)
CLEARED FOR PROCEDURE WITH DR Tamera Punt  Follow-Up: You will need a follow up appointment on: 02-16-2019 @ 930.   You may see Mertie Moores, MD or one of the following Advanced Practice Providers on your designated Care Team:  Richardson Dopp, PA-C [place], [place] Daune Perch, NP     Medication Instructions:  NO CHANGES- Your physician recommends that you continue on your current medications as directed. Please refer to the Current Medication list given to you today. If you need a refill on your cardiac medications before your next appointment, please call your pharmacy. Labwork: When you have labs (blood work) and your tests are completely normal, you will receive your results ONLY by [place] (if you have MyChart) -OR- A paper copy in the mail.  At [facility], you and your health needs are our priority.  As part of our continuing mission to provide you with exceptional heart care, we have created designated Provider Care Teams.  These Care Teams include your primary Cardiologist (physician) and Advanced Practice Providers (APPs -  Physician Assistants and Nurse Practitioners) who all work together to provide you with the care you need, when you need it.  Thank you for choosing [facility] at [facility]!!

## 2019-01-06 NOTE — Progress Notes (Signed)
FAXED VIA Epic [place]; DR. Tania Ade  office phone number [phone], office fax number [phone]

## 2019-01-09 ENCOUNTER — Other Ambulatory Visit: Payer: Self-pay | Admitting: Orthopedic Surgery

## 2019-01-16 NOTE — Progress Notes (Signed)
CARDAIC CLEARCNE NOTE Jory Sims NP 01-06-19 Epic EKG 01-06-19 EPIC

## 2019-01-16 NOTE — Patient Instructions (Signed)
Ashley Dyer    Your procedure is scheduled on: 01-22-2019  Report to [facility] Main  Entrance  Report to admitting at [place] 19 TEST ON Monday 01-19-2019 @_______ , THIS TEST MUST BE DONE BEFORE SURGERY, COME TO [place].    Call this number if you have problems the morning of surgery [phone]    Remember: . BRUSH YOUR TEETH MORNING OF SURGERY AND RINSE YOUR MOUTH OUT, NO CHEWING GUM CANDY OR MINTS.   NO SOLID FOOD AFTER MIDNIGHT THE NIGHT PRIOR TO SURGERY. NOTHING BY MOUTH EXCEPT CLEAR LIQUIDS UNTIL 430 AM. PLEASE FINISH ENSURE DRINK PER SURGEON ORDER WHICH NEEDS TO BE COMPLETED AT 430 AM.    CLEAR LIQUID DIET   Foods Allowed                                                                     Foods Excluded  Coffee and tea, regular and decaf                             liquids that you cannot  Plain Jell-O in any flavor                                             see through such as: Fruit ices (not with fruit pulp)                                     milk, soups, orange juice  Iced Popsicles                                    All solid food Carbonated beverages, regular and diet                                    Cranberry, grape and apple juices Sports drinks like Gatorade Lightly seasoned clear broth or consume(fat free) Sugar, honey syrup  Sample Menu Breakfast                                Lunch                                     Supper Cranberry juice                    Beef broth                            Chicken broth Jell-O  Grape juice                           Apple juice Coffee or tea                        Jell-O                                      Popsicle                                                Coffee or tea                        Coffee or  tea  _____________________________________________________________________    Take these medicines the morning of surgery with A SIP OF WATER: ALBUTEROL AND FLOVENT INHALER IF NEEDED AND BRING INHALERS                                You may not have any metal on your body including hair pins and              piercings  Do not wear jewelry, make-up, lotions, powders or perfumes, deodorant             Do not wear nail polish.  Do not shave  48 hours prior to surgery.              Men may shave face and neck.   Do not bring valuables to the hospital. [place].  Contacts, dentures or bridgework may not be worn into surgery.  Leave suitcase in the car. After surgery it may be brought to your room.                 Please read over the following fact sheets you were given: _____________________________________________________________________             [facility] - Preparing for Surgery Before surgery, you can play an important role.  Because skin is not sterile, your skin needs to be as free of germs as possible.  You can reduce the number of germs on your skin by washing with CHG (chlorahexidine gluconate) soap before surgery.  CHG is an antiseptic cleaner which kills germs and bonds with the skin to continue killing germs even after washing. Please DO NOT use if you have an allergy to CHG or antibacterial soaps.  If your skin becomes reddened/irritated stop using the CHG and inform your nurse when you arrive at Short Stay. Do not shave (including legs and underarms) for at least 48 hours prior to the first CHG shower.  You may shave your face/neck. Please follow these instructions carefully:  1.  Shower with CHG Soap the night before surgery and the  morning of Surgery.  2.  If you choose to wash your hair, wash your hair first as usual with your  normal  shampoo.  3.  After you shampoo, rinse your hair and body thoroughly to remove the   shampoo.  4.  Use CHG as you would any other liquid soap.  You can apply chg directly  to the skin and wash                       Gently with a scrungie or clean washcloth.  5.  Apply the CHG Soap to your body ONLY FROM THE NECK DOWN.   Do not use on face/ open                           Wound or open sores. Avoid contact with eyes, ears mouth and genitals (private parts).                       Wash face,  Genitals (private parts) with your normal soap.             6.  Wash thoroughly, paying special attention to the area where your surgery  will be performed.  7.  Thoroughly rinse your body with warm water from the neck down.  8.  DO NOT shower/wash with your normal soap after using and rinsing off  the CHG Soap.                9.  Pat yourself dry with a clean towel.            10.  Wear clean pajamas.            11.  Place clean sheets on your bed the night of your first shower and do not  sleep with pets. Day of Surgery : Do not apply any lotions/deodorants the morning of surgery.  Please wear clean clothes to the hospital/surgery center.  FAILURE TO FOLLOW THESE INSTRUCTIONS MAY RESULT IN THE CANCELLATION OF YOUR SURGERY PATIENT SIGNATURE_________________________________  NURSE SIGNATURE__________________________________  ________________________________________________________________________   Ashley Dyer  An incentive spirometer is a tool that can help keep your lungs clear and active. This tool measures how well you are filling your lungs with each breath. Taking long deep breaths may help reverse or decrease the chance of developing breathing (pulmonary) problems (especially infection) following:  A long period of time when you are unable to move or be active. BEFORE THE PROCEDURE   If the spirometer includes an indicator to show your best effort, your nurse or respiratory therapist will set it to a desired goal.  If possible, sit up straight  or lean slightly forward. Try not to slouch.  Hold the incentive spirometer in an upright position. INSTRUCTIONS FOR USE  1. Sit on the edge of your bed if possible, or sit up as far as you can in bed or on a chair. 2. Hold the incentive spirometer in an upright position. 3. Breathe out normally. 4. Place the mouthpiece in your mouth and seal your lips tightly around it. 5. Breathe in slowly and as deeply as possible, raising the piston or the ball toward the top of the column. 6. Hold your breath for 3-5 seconds or for as long as possible. Allow the piston or ball to fall to the bottom of the column. 7. Remove the mouthpiece from your mouth and breathe out normally. 8. Rest for a few seconds and repeat Steps 1 through 7 at least 10 times every 1-2 hours when you are awake. Take your time and take a few normal breaths between deep breaths. 9. The spirometer may include an indicator to show  your best effort. Use the indicator as a goal to work toward during each repetition. 10. After each set of 10 deep breaths, practice coughing to be sure your lungs are clear. If you have an incision (the cut made at the time of surgery), support your incision when coughing by placing a pillow or rolled up towels firmly against it. Once you are able to get out of bed, walk around indoors and cough well. You may stop using the incentive spirometer when instructed by your caregiver.  RISKS AND COMPLICATIONS  Take your time so you do not get dizzy or light-headed.  If you are in pain, you may need to take or ask for pain medication before doing incentive spirometry. It is harder to take a deep breath if you are having pain. AFTER USE  Rest and breathe slowly and easily.  It can be helpful to keep track of a log of your progress. Your caregiver can provide you with a simple table to help with this. If you are using the spirometer at home, follow these instructions: [place] IF:   You are having  difficultly using the spirometer.  You have trouble using the spirometer as often as instructed.  Your pain medication is not giving enough relief while using the spirometer.  You develop fever of 100.5 F (38.1 C) or higher. SEEK IMMEDIATE MEDICAL CARE IF:   You cough up bloody sputum that had not been present before.  You develop fever of 102 F (38.9 C) or greater.  You develop worsening pain at or near the incision site. MAKE SURE YOU:   Understand these instructions.  Will watch your condition.  Will get help right away if you are not doing well or get worse. Document Released: 12/03/2006 Document Revised: 10/15/2011 Document Reviewed: 02/03/2007 [facility] Patient Information 2014 [place], [place].   ________________________________________________________________________

## 2019-01-19 ENCOUNTER — Encounter (HOSPITAL_COMMUNITY): Payer: Self-pay

## 2019-01-19 ENCOUNTER — Ambulatory Visit (HOSPITAL_COMMUNITY)
Admission: RE | Admit: 2019-01-19 | Discharge: 2019-01-19 | Disposition: A | Discharge status: Home / Self Care | Payer: Medicare Other | Source: Ambulatory Visit | Attending: Orthopedic Surgery | Admitting: Orthopedic Surgery

## 2019-01-19 ENCOUNTER — Other Ambulatory Visit: Payer: Self-pay

## 2019-01-19 ENCOUNTER — Encounter (HOSPITAL_COMMUNITY)
Admission: RE | Admit: 2019-01-19 | Discharge: 2019-01-19 | Disposition: A | Discharge status: Home / Self Care | Payer: Medicare Other | Source: Ambulatory Visit | Attending: Orthopedic Surgery | Admitting: Orthopedic Surgery

## 2019-01-19 ENCOUNTER — Other Ambulatory Visit (HOSPITAL_COMMUNITY)
Admission: RE | Admit: 2019-01-19 | Discharge: 2019-01-19 | Disposition: A | Discharge status: Home / Self Care | Payer: Medicare Other | Source: Ambulatory Visit | Attending: Orthopedic Surgery | Admitting: Orthopedic Surgery

## 2019-01-19 DIAGNOSIS — Z01811 Encounter for preprocedural respiratory examination: Secondary | ICD-10-CM

## 2019-01-19 LAB — CBC WITH DIFFERENTIAL/PLATELET
Abs Immature Granulocytes: 0.02 10*3/uL (ref 0.00–0.07)
Basophils Absolute: 0.1 10*3/uL (ref 0.0–0.1)
Basophils Relative: 1 %
Eosinophils Absolute: 0.2 10*3/uL (ref 0.0–0.5)
Eosinophils Relative: 4 %
HCT: 44.8 % (ref 36.0–46.0)
Hemoglobin: 14.5 g/dL (ref 12.0–15.0)
Immature Granulocytes: 0 %
Lymphocytes Relative: 32 %
Lymphs Abs: 1.8 10*3/uL (ref 0.7–4.0)
MCH: 31 pg (ref 26.0–34.0)
MCHC: 32.4 g/dL (ref 30.0–36.0)
MCV: 95.9 fL (ref 80.0–100.0)
Monocytes Absolute: 0.5 10*3/uL (ref 0.1–1.0)
Monocytes Relative: 9 %
Neutro Abs: 3.1 10*3/uL (ref 1.7–7.7)
Neutrophils Relative %: 54 %
Platelets: 239 10*3/uL (ref 150–400)
RBC: 4.67 MIL/uL (ref 3.87–5.11)
RDW: 13.3 % (ref 11.5–15.5)
WBC: 5.8 10*3/uL (ref 4.0–10.5)
nRBC: 0 % (ref 0.0–0.2)

## 2019-01-19 LAB — URINALYSIS, ROUTINE W REFLEX MICROSCOPIC
Bilirubin Urine: NEGATIVE
Glucose, UA: NEGATIVE mg/dL
Ketones, ur: NEGATIVE mg/dL
Nitrite: NEGATIVE
Protein, ur: NEGATIVE mg/dL
Specific Gravity, Urine: 1.011 (ref 1.005–1.030)
pH: 7 (ref 5.0–8.0)

## 2019-01-19 LAB — PROTIME-INR
INR: 0.9 (ref 0.8–1.2)
Prothrombin Time: 12.2 seconds (ref 11.4–15.2)

## 2019-01-19 LAB — COMPREHENSIVE METABOLIC PANEL WITH GFR
ALT: 24 U/L (ref 0–44)
AST: 29 U/L (ref 15–41)
Albumin: 4.7 g/dL (ref 3.5–5.0)
Alkaline Phosphatase: 70 U/L (ref 38–126)
Anion gap: 8 (ref 5–15)
BUN: 23 mg/dL (ref 8–23)
CO2: 28 mmol/L (ref 22–32)
Calcium: 9.7 mg/dL (ref 8.9–10.3)
Chloride: 105 mmol/L (ref 98–111)
Creatinine, Ser: 0.75 mg/dL (ref 0.44–1.00)
GFR calc Af Amer: >60 mL/min
GFR calc non Af Amer: >60 mL/min
Glucose, Bld: 108 mg/dL — ABNORMAL HIGH (ref 70–99)
Potassium: 4.3 mmol/L (ref 3.5–5.1)
Sodium: 141 mmol/L (ref 135–145)
Total Bilirubin: 0.9 mg/dL (ref 0.3–1.2)
Total Protein: 7.9 g/dL (ref 6.5–8.1)

## 2019-01-19 LAB — SURGICAL PCR SCREEN
MRSA, PCR: NEGATIVE
Staphylococcus aureus: NEGATIVE

## 2019-01-19 LAB — APTT: aPTT: 35 s (ref 24–36)

## 2019-01-20 LAB — NOVEL CORONAVIRUS, NAA (HOSP ORDER, SEND-OUT TO REF LAB; TAT 18-24 HRS): SARS-CoV-2, NAA: NOT DETECTED

## 2019-01-20 NOTE — Progress Notes (Signed)
Anesthesia Chart Review   Case: [id number] Date/Time: 01/22/19 0715   Procedure: TOTAL SHOULDER ARTHROPLASTY (Left )   Anesthesia type: Choice   Pre-op diagnosis: LEFT TOTAL SHOULDER ARTHROPLASTY   Location: [place] / [facility] ORS   Surgeon: Tania Ade, MD      DISCUSSION:73 yo former smoker (quit 08/06/88) with h/o anxiety, HTN, asthma, HLD, left total shoulder arthroplasty scheduled for above procedure 01/22/2019 with Dr. Tania Ade.   Pt reports experienced fear of feeling paralyzed with anesthesia in the past.   Pt last seen by cardiology 01/06/2019.  Seen by Daune Perch, NP.  Per OV note, "Given past medical history and time since last visit, based on ACC/AHA guidelines, Ashley Dyer would be at acceptable risk for the planned procedure without further cardiovascular testing."  Pt can proceed with planned procedure barring acute status change.  VS: BP 136/69 (BP Location: Right Arm)   Pulse 71   Temp 36.9 C (Oral)   Resp 18   Ht 5\' 6"  (1.676 m)   Wt 71.8 kg   SpO2 100%   BMI 25.56 kg/m   PROVIDERS: Vivi Barrack, MD is PCP   Nahser, MD is Cardiologist  LABS: Labs reviewed: Acceptable for surgery. (all labs ordered are listed, but only abnormal results are displayed)  Labs Reviewed  COMPREHENSIVE METABOLIC PANEL - Abnormal; Notable for the following components:      Result Value   Glucose, Bld 108 (*)    All other components within normal limits  URINALYSIS, ROUTINE W REFLEX MICROSCOPIC - Abnormal; Notable for the following components:   APPearance HAZY (*)    Hgb urine dipstick SMALL (*)    Leukocytes,Ua MODERATE (*)    Bacteria, UA MANY (*)    All other components within normal limits  SURGICAL PCR SCREEN  APTT  CBC WITH DIFFERENTIAL/PLATELET  PROTIME-INR     IMAGES: 01/19/2019 Chest Xray  FINDINGS: The heart size and mediastinal contours are stable. There are scattered right lung granulomas which are stable. The lungs are otherwise clear.  There is no pleural effusion or pneumothorax. No acute osseous findings are seen status post interval right shoulder arthroplasty.  IMPRESSION: Stable chest without evidence of active cardiopulmonary process.  EKG: 01/06/2019 Rate 73 bpm Normal sinus rhythm  Possible anterior infarct, age undetermined   CV:  Past Medical History:  Diagnosis Date  . Allergy   . Anxiety    ED- visit for anxiety  . Arthritis    OA- shoulder(both) , hands  . Asthma   . Complication of anesthesia    fear- of feeling paralyzed - 1980's   . History of kidney stones 1980's   surgery  . Hyperlipidemia   . Hypertension   . 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 CATARACT EXTRACTION Bilateral   . KIDNEY STONE SURGERY    . TOTAL SHOULDER ARTHROPLASTY Right 06/13/2017  . TOTAL SHOULDER ARTHROPLASTY Right 06/13/2017   Procedure: TOTAL SHOULDER ARTHROPLASTY;  Surgeon: Tania Ade, MD;  Location: [place];  Service: Orthopedics;  Laterality: Right;  Right total shoulder arthroplasty  . VAGINAL DELIVERY  1976    MEDICATIONS: . triamterene-hydrochlorothiazide (MAXZIDE) 75-50 MG tablet  . albuterol (PROVENTIL HFA;VENTOLIN HFA) 108 (90 Base) MCG/ACT inhaler  . Alirocumab (PRALUENT) 75 MG/ML SOAJ  . Ascorbic Acid (VITAMIN C) 1000 MG tablet  . CALCIUM CITRATE PO  . Cholecalciferol (VITAMIN D3) 50 MCG (2000 UT) capsule  . cromolyn (NASALCROM)  5.2 MG/ACT nasal spray  . fluticasone (FLOVENT HFA) 110 MCG/ACT inhaler  . meloxicam (MOBIC) 15 MG tablet   No current facility-administered medications for this encounter.    Maia Plan [facility] Pre-Surgical Testing [phone] 01/20/19 1:13 PM

## 2019-01-20 NOTE — Anesthesia Preprocedure Evaluation (Addendum)
Anesthesia Evaluation  Patient identified by MRN, date of birth, ID band Patient awake    Reviewed: Allergy & Precautions, NPO status , Patient's Chart, lab work & pertinent test results  Airway Mallampati: II  TM Distance: >3 FB Neck ROM: Full    Dental no notable dental hx.    Pulmonary neg pulmonary ROS, former smoker,    Pulmonary exam normal breath sounds clear to auscultation       Cardiovascular hypertension, Normal cardiovascular exam Rhythm:Regular Rate:Normal     Neuro/Psych negative neurological ROS  negative psych ROS   GI/Hepatic negative GI ROS, Neg liver ROS,   Endo/Other  negative endocrine ROS  Renal/GU negative Renal ROS  negative genitourinary   Musculoskeletal negative musculoskeletal ROS (+)   Abdominal   Peds negative pediatric ROS (+)  Hematology negative hematology ROS (+)   Anesthesia Other Findings   Reproductive/Obstetrics negative OB ROS                            Anesthesia Physical Anesthesia Plan  ASA: II  Anesthesia Plan: General   Post-op Pain Management:  Regional for Post-op pain   Induction: Intravenous  PONV Risk Score and Plan: 3 and Ondansetron, Dexamethasone and Treatment may vary due to age or medical condition  Airway Management Planned: Oral ETT  Additional Equipment:   Intra-op Plan:   Post-operative Plan: Extubation in OR  Informed Consent: I have reviewed the patients History and Physical, chart, labs and discussed the procedure including the risks, benefits and alternatives for the proposed anesthesia with the patient or authorized representative who has indicated his/her understanding and acceptance.     Dental advisory given  Plan Discussed with: CRNA and Surgeon  Anesthesia Plan Comments: (See PAT note 01/19/2019, Konrad Felix, PA-C)       Anesthesia Quick Evaluation

## 2019-01-21 ENCOUNTER — Other Ambulatory Visit: Payer: Self-pay

## 2019-01-21 NOTE — Progress Notes (Signed)
SPOKE W/  _     SCREENING SYMPTOMS OF COVID 19:   COUGH--no  RUNNY NOSE--- no  SORE THROAT---no  NASAL CONGESTION----no  SNEEZING----no  SHORTNESS OF BREATH---no  DIFFICULTY BREATHING---no  TEMP >100.0 -----no  UNEXPLAINED BODY ACHES------no  CHILLS -------- no  HEADACHES ---------no  LOSS OF SMELL/ TASTE --------no    HAVE YOU OR ANY FAMILY MEMBER TRAVELLED PAST 14 DAYS OUT OF THE   COUNTY---no STATE----no COUNTRY----no  HAVE YOU OR ANY FAMILY MEMBER BEEN EXPOSED TO ANYONE WITH COVID 19? no

## 2019-01-22 ENCOUNTER — Inpatient Hospital Stay (HOSPITAL_COMMUNITY): Payer: Medicare Other

## 2019-01-22 ENCOUNTER — Inpatient Hospital Stay (HOSPITAL_COMMUNITY): Payer: Medicare Other | Admitting: Certified Registered"

## 2019-01-22 ENCOUNTER — Inpatient Hospital Stay (HOSPITAL_COMMUNITY)
Admission: RE | Admit: 2019-01-22 | Discharge: 2019-01-23 | DRG: 483 | Disposition: A | Discharge status: Home / Self Care | Payer: Medicare Other | Attending: Orthopedic Surgery | Admitting: Orthopedic Surgery

## 2019-01-22 ENCOUNTER — Encounter (HOSPITAL_COMMUNITY): Payer: Self-pay

## 2019-01-22 ENCOUNTER — Inpatient Hospital Stay (HOSPITAL_COMMUNITY): Payer: Medicare Other | Admitting: Physician Assistant

## 2019-01-22 ENCOUNTER — Encounter (HOSPITAL_COMMUNITY)
Admission: RE | Disposition: A | Discharge status: Home / Self Care | Payer: Self-pay | Source: Home / Self Care | Attending: Orthopedic Surgery

## 2019-01-22 ENCOUNTER — Other Ambulatory Visit: Payer: Self-pay

## 2019-01-22 DIAGNOSIS — M19012 Primary osteoarthritis, left shoulder: Secondary | ICD-10-CM | POA: Diagnosis present

## 2019-01-22 DIAGNOSIS — J45909 Unspecified asthma, uncomplicated: Secondary | ICD-10-CM | POA: Diagnosis present

## 2019-01-22 DIAGNOSIS — Z8249 Family history of ischemic heart disease and other diseases of the circulatory system: Secondary | ICD-10-CM

## 2019-01-22 DIAGNOSIS — I10 Essential (primary) hypertension: Secondary | ICD-10-CM | POA: Diagnosis present

## 2019-01-22 DIAGNOSIS — Z96612 Presence of left artificial shoulder joint: Secondary | ICD-10-CM

## 2019-01-22 DIAGNOSIS — Z79899 Other long term (current) drug therapy: Secondary | ICD-10-CM

## 2019-01-22 DIAGNOSIS — Z87891 Personal history of nicotine dependence: Secondary | ICD-10-CM | POA: Diagnosis not present

## 2019-01-22 DIAGNOSIS — Z800 Family history of malignant neoplasm of digestive organs: Secondary | ICD-10-CM | POA: Diagnosis not present

## 2019-01-22 DIAGNOSIS — Z87442 Personal history of urinary calculi: Secondary | ICD-10-CM | POA: Diagnosis not present

## 2019-01-22 DIAGNOSIS — Z1159 Encounter for screening for other viral diseases: Secondary | ICD-10-CM | POA: Diagnosis not present

## 2019-01-22 DIAGNOSIS — E785 Hyperlipidemia, unspecified: Secondary | ICD-10-CM | POA: Diagnosis present

## 2019-01-22 HISTORY — PX: TOTAL SHOULDER ARTHROPLASTY: SHX126

## 2019-01-22 SURGERY — ARTHROPLASTY, SHOULDER, TOTAL
Anesthesia: General | Laterality: Left

## 2019-01-22 MED ORDER — LIDOCAINE 2% (20 MG/ML) 5 ML SYRINGE
INTRAMUSCULAR | Status: DC | PRN
  Administered 2019-01-22: 60 mg via INTRAVENOUS

## 2019-01-22 MED ORDER — DIPHENHYDRAMINE HCL 12.5 MG/5ML PO ELIX: 12.5000 mg | ORAL_SOLUTION | ORAL | Status: DC | PRN

## 2019-01-22 MED ORDER — ONDANSETRON HCL 4 MG/2ML IJ SOLN
INTRAMUSCULAR | Status: AC
  Filled 2019-01-22: qty 2

## 2019-01-22 MED ORDER — CHLORHEXIDINE GLUCONATE 4 % EX LIQD: 60.0000 mL | Freq: Once | CUTANEOUS | Status: DC

## 2019-01-22 MED ORDER — TRANEXAMIC ACID-NACL 1000-0.7 MG/100ML-% IV SOLN
1000.0000 mg | INTRAVENOUS | Status: AC
  Administered 2019-01-22: 1000 mg via INTRAVENOUS

## 2019-01-22 MED ORDER — ROCURONIUM BROMIDE 10 MG/ML (PF) SYRINGE
PREFILLED_SYRINGE | INTRAVENOUS | Status: DC | PRN
  Administered 2019-01-22: 10 mg via INTRAVENOUS
  Administered 2019-01-22: 30 mg via INTRAVENOUS
  Administered 2019-01-22: 10 mg via INTRAVENOUS

## 2019-01-22 MED ORDER — FENTANYL CITRATE (PF) 100 MCG/2ML IJ SOLN
INTRAMUSCULAR | Status: AC
  Filled 2019-01-22: qty 2

## 2019-01-22 MED ORDER — POLYETHYLENE GLYCOL 3350 17 G PO PACK: 17.0000 g | PACK | Freq: Every day | ORAL | Status: DC | PRN

## 2019-01-22 MED ORDER — PROPOFOL 10 MG/ML IV BOLUS
INTRAVENOUS | Status: AC
  Filled 2019-01-22: qty 40

## 2019-01-22 MED ORDER — PHENYLEPHRINE 40 MCG/ML (10ML) SYRINGE FOR IV PUSH (FOR BLOOD PRESSURE SUPPORT)
PREFILLED_SYRINGE | INTRAVENOUS | Status: AC
  Filled 2019-01-22: qty 10

## 2019-01-22 MED ORDER — BISACODYL 5 MG PO TBEC: 5.0000 mg | DELAYED_RELEASE_TABLET | Freq: Every day | ORAL | Status: DC | PRN

## 2019-01-22 MED ORDER — PROPOFOL 10 MG/ML IV BOLUS
INTRAVENOUS | Status: DC | PRN
  Administered 2019-01-22: 100 mg via INTRAVENOUS

## 2019-01-22 MED ORDER — FENTANYL CITRATE (PF) 100 MCG/2ML IJ SOLN
INTRAMUSCULAR | Status: DC | PRN
  Administered 2019-01-22 (×2): 50 ug via INTRAVENOUS

## 2019-01-22 MED ORDER — ACETAMINOPHEN 500 MG PO TABS
1000.0000 mg | ORAL_TABLET | Freq: Four times a day (QID) | ORAL | Status: AC
  Administered 2019-01-22 – 2019-01-23 (×4): 1000 mg via ORAL
  Filled 2019-01-22 (×4): qty 2

## 2019-01-22 MED ORDER — EPHEDRINE 5 MG/ML INJ
INTRAVENOUS | Status: AC
  Filled 2019-01-22: qty 10

## 2019-01-22 MED ORDER — MIDAZOLAM HCL 5 MG/5ML IJ SOLN
INTRAMUSCULAR | Status: DC | PRN
  Administered 2019-01-22: 2 mg via INTRAVENOUS

## 2019-01-22 MED ORDER — SUCCINYLCHOLINE CHLORIDE 200 MG/10ML IV SOSY
PREFILLED_SYRINGE | INTRAVENOUS | Status: DC | PRN
  Administered 2019-01-22: 100 mg via INTRAVENOUS

## 2019-01-22 MED ORDER — METOCLOPRAMIDE HCL 5 MG/ML IJ SOLN: 5.0000 mg | Freq: Three times a day (TID) | INTRAMUSCULAR | Status: DC | PRN

## 2019-01-22 MED ORDER — ONDANSETRON HCL 4 MG/2ML IJ SOLN: 4.0000 mg | Freq: Four times a day (QID) | INTRAMUSCULAR | Status: DC | PRN

## 2019-01-22 MED ORDER — ALUM & MAG HYDROXIDE-SIMETH 200-200-20 MG/5ML PO SUSP: 30.0000 mL | ORAL | Status: DC | PRN

## 2019-01-22 MED ORDER — SODIUM CHLORIDE 0.9 % IV SOLN
INTRAVENOUS | Status: DC | PRN
  Administered 2019-01-22: 50 ug/min via INTRAVENOUS

## 2019-01-22 MED ORDER — CEFAZOLIN SODIUM-DEXTROSE 2-4 GM/100ML-% IV SOLN
2.0000 g | Freq: Four times a day (QID) | INTRAVENOUS | Status: AC
  Administered 2019-01-22 – 2019-01-23 (×3): 2 g via INTRAVENOUS
  Filled 2019-01-22 (×3): qty 100

## 2019-01-22 MED ORDER — SUGAMMADEX SODIUM 200 MG/2ML IV SOLN
INTRAVENOUS | Status: AC
  Filled 2019-01-22: qty 2

## 2019-01-22 MED ORDER — DEXAMETHASONE SODIUM PHOSPHATE 10 MG/ML IJ SOLN
INTRAMUSCULAR | Status: AC
  Filled 2019-01-22: qty 1

## 2019-01-22 MED ORDER — METHOCARBAMOL 500 MG PO TABS
500.0000 mg | ORAL_TABLET | Freq: Four times a day (QID) | ORAL | Status: DC | PRN
  Administered 2019-01-22 – 2019-01-23 (×2): 500 mg via ORAL
  Filled 2019-01-22 (×3): qty 1

## 2019-01-22 MED ORDER — METOCLOPRAMIDE HCL 5 MG PO TABS: 5.0000 mg | ORAL_TABLET | Freq: Three times a day (TID) | ORAL | Status: DC | PRN

## 2019-01-22 MED ORDER — MIDAZOLAM HCL 2 MG/2ML IJ SOLN
INTRAMUSCULAR | Status: AC
  Filled 2019-01-22: qty 2

## 2019-01-22 MED ORDER — DEXAMETHASONE SODIUM PHOSPHATE 10 MG/ML IJ SOLN
INTRAMUSCULAR | Status: DC | PRN
  Administered 2019-01-22: 10 mg via INTRAVENOUS

## 2019-01-22 MED ORDER — MENTHOL 3 MG MT LOZG: 1.0000 | LOZENGE | OROMUCOSAL | Status: DC | PRN

## 2019-01-22 MED ORDER — LACTATED RINGERS IV SOLN
INTRAVENOUS | Status: DC
  Administered 2019-01-22 (×2): via INTRAVENOUS

## 2019-01-22 MED ORDER — ASPIRIN EC 81 MG PO TBEC
81.0000 mg | DELAYED_RELEASE_TABLET | Freq: Two times a day (BID) | ORAL | Status: DC
  Administered 2019-01-22 – 2019-01-23 (×2): 81 mg via ORAL
  Filled 2019-01-22 (×2): qty 1

## 2019-01-22 MED ORDER — DOCUSATE SODIUM 100 MG PO CAPS
100.0000 mg | ORAL_CAPSULE | Freq: Two times a day (BID) | ORAL | Status: DC
  Administered 2019-01-22 – 2019-01-23 (×2): 100 mg via ORAL
  Filled 2019-01-22 (×2): qty 1

## 2019-01-22 MED ORDER — PHENYLEPHRINE HCL (PRESSORS) 10 MG/ML IV SOLN
INTRAVENOUS | Status: AC
  Filled 2019-01-22: qty 1

## 2019-01-22 MED ORDER — FLEET ENEMA 7-19 GM/118ML RE ENEM: 1.0000 | ENEMA | Freq: Once | RECTAL | Status: DC | PRN

## 2019-01-22 MED ORDER — SULFAMETHOXAZOLE-TRIMETHOPRIM 400-80 MG PO TABS
1.0000 | ORAL_TABLET | Freq: Two times a day (BID) | ORAL | Status: DC
  Administered 2019-01-22 – 2019-01-23 (×2): 1 via ORAL
  Filled 2019-01-22 (×2): qty 1

## 2019-01-22 MED ORDER — PHENOL 1.4 % MT LIQD: 1.0000 | OROMUCOSAL | Status: DC | PRN

## 2019-01-22 MED ORDER — EPHEDRINE SULFATE-NACL 50-0.9 MG/10ML-% IV SOSY
PREFILLED_SYRINGE | INTRAVENOUS | Status: DC | PRN
  Administered 2019-01-22 (×3): 5 mg via INTRAVENOUS

## 2019-01-22 MED ORDER — DEXMEDETOMIDINE HCL IN NACL 400 MCG/100ML IV SOLN
INTRAVENOUS | Status: DC | PRN
  Administered 2019-01-22 (×2): 4 ug via INTRAVENOUS

## 2019-01-22 MED ORDER — ACETAMINOPHEN 10 MG/ML IV SOLN: 1000.0000 mg | Freq: Once | INTRAVENOUS | Status: DC | PRN

## 2019-01-22 MED ORDER — OXYCODONE HCL 5 MG PO TABS
5.0000 mg | ORAL_TABLET | ORAL | Status: DC | PRN
  Administered 2019-01-22 – 2019-01-23 (×3): 5 mg via ORAL
  Filled 2019-01-22: qty 2
  Filled 2019-01-22 (×2): qty 1

## 2019-01-22 MED ORDER — ZOLPIDEM TARTRATE 5 MG PO TABS: 5.0000 mg | ORAL_TABLET | Freq: Every evening | ORAL | Status: DC | PRN

## 2019-01-22 MED ORDER — HYDROMORPHONE HCL 1 MG/ML IJ SOLN: 0.5000 mg | INTRAMUSCULAR | Status: DC | PRN

## 2019-01-22 MED ORDER — TRIAMTERENE-HCTZ 75-50 MG PO TABS: 0.5000 | ORAL_TABLET | ORAL | Status: DC

## 2019-01-22 MED ORDER — ACETAMINOPHEN 325 MG PO TABS: 325.0000 mg | ORAL_TABLET | Freq: Four times a day (QID) | ORAL | Status: DC | PRN

## 2019-01-22 MED ORDER — SODIUM CHLORIDE 0.9 % IV SOLN
INTRAVENOUS | Status: AC
  Administered 2019-01-22 – 2019-01-23 (×3): via INTRAVENOUS

## 2019-01-22 MED ORDER — BUPIVACAINE HCL (PF) 0.5 % IJ SOLN
INTRAMUSCULAR | Status: DC | PRN
  Administered 2019-01-22: 15 mL via PERINEURAL

## 2019-01-22 MED ORDER — METHOCARBAMOL 500 MG IVPB - SIMPLE MED
500.0000 mg | Freq: Four times a day (QID) | INTRAVENOUS | Status: DC | PRN
  Filled 2019-01-22: qty 50

## 2019-01-22 MED ORDER — PHENYLEPHRINE 40 MCG/ML (10ML) SYRINGE FOR IV PUSH (FOR BLOOD PRESSURE SUPPORT)
PREFILLED_SYRINGE | INTRAVENOUS | Status: DC | PRN
  Administered 2019-01-22: 120 ug via INTRAVENOUS

## 2019-01-22 MED ORDER — DEXMEDETOMIDINE HCL IN NACL 200 MCG/50ML IV SOLN
INTRAVENOUS | Status: AC
  Filled 2019-01-22: qty 50

## 2019-01-22 MED ORDER — BUPIVACAINE LIPOSOME 1.3 % IJ SUSP
INTRAMUSCULAR | Status: DC | PRN
  Administered 2019-01-22: 10 mL via PERINEURAL

## 2019-01-22 MED ORDER — SUCCINYLCHOLINE CHLORIDE 200 MG/10ML IV SOSY
PREFILLED_SYRINGE | INTRAVENOUS | Status: AC
  Filled 2019-01-22: qty 10

## 2019-01-22 MED ORDER — SODIUM CHLORIDE 0.9 % IR SOLN
Status: DC | PRN
  Administered 2019-01-22: 1000 mL

## 2019-01-22 MED ORDER — ONDANSETRON HCL 4 MG/2ML IJ SOLN
INTRAMUSCULAR | Status: DC | PRN
  Administered 2019-01-22: 4 mg via INTRAVENOUS

## 2019-01-22 MED ORDER — ROCURONIUM BROMIDE 10 MG/ML (PF) SYRINGE
PREFILLED_SYRINGE | INTRAVENOUS | Status: AC
  Filled 2019-01-22: qty 10

## 2019-01-22 MED ORDER — LIDOCAINE 2% (20 MG/ML) 5 ML SYRINGE
INTRAMUSCULAR | Status: AC
  Filled 2019-01-22: qty 5

## 2019-01-22 MED ORDER — PROMETHAZINE HCL 25 MG/ML IJ SOLN: 6.2500 mg | INTRAMUSCULAR | Status: DC | PRN

## 2019-01-22 MED ORDER — CEFAZOLIN SODIUM-DEXTROSE 2-4 GM/100ML-% IV SOLN
2.0000 g | INTRAVENOUS | Status: AC
  Administered 2019-01-22: 08:00:00 2 g via INTRAVENOUS
  Filled 2019-01-22: qty 100

## 2019-01-22 MED ORDER — OXYCODONE HCL 5 MG PO TABS: 10.0000 mg | ORAL_TABLET | ORAL | Status: DC | PRN

## 2019-01-22 MED ORDER — TRIAMTERENE-HCTZ 75-50 MG PO TABS
1.0000 | ORAL_TABLET | ORAL | Status: DC
  Filled 2019-01-22: qty 1

## 2019-01-22 MED ORDER — ONDANSETRON HCL 4 MG PO TABS: 4.0000 mg | ORAL_TABLET | Freq: Four times a day (QID) | ORAL | Status: DC | PRN

## 2019-01-22 MED ORDER — TRANEXAMIC ACID-NACL 1000-0.7 MG/100ML-% IV SOLN
INTRAVENOUS | Status: AC
  Filled 2019-01-22: qty 100

## 2019-01-22 MED ORDER — LACTATED RINGERS IV SOLN
INTRAVENOUS | Status: DC
  Administered 2019-01-22: 07:00:00 via INTRAVENOUS

## 2019-01-22 MED ORDER — FENTANYL CITRATE (PF) 100 MCG/2ML IJ SOLN: 25.0000 ug | INTRAMUSCULAR | Status: DC | PRN

## 2019-01-22 MED ORDER — SUGAMMADEX SODIUM 500 MG/5ML IV SOLN
INTRAVENOUS | Status: AC
  Filled 2019-01-22: qty 5

## 2019-01-22 SURGICAL SUPPLY — 75 items
AEQUALIS HUMERAL HEAD (Orthopedic Implant) ×2 IMPLANT
BAG ZIPLOCK 12X15 (MISCELLANEOUS) ×3 IMPLANT
BIT DRILL 1.6MX128 (BIT) ×2 IMPLANT
BIT DRILL 1.6MX128MM (BIT) ×1
BIT DRILL 2.4X128 (BIT) IMPLANT
BIT DRILL 2.4X128MM (BIT)
BLADE SAW SAG 73X25 THK (BLADE) ×2
BLADE SAW SGTL 18X1.27X75 (BLADE) IMPLANT
BLADE SAW SGTL 18X1.27X75MM (BLADE)
BLADE SAW SGTL 73X25 THK (BLADE) ×1 IMPLANT
BOWL SMART MIX CTS (DISPOSABLE) ×3 IMPLANT
CEMENT BONE DEPUY (Cement) ×6 IMPLANT
CHLORAPREP W/TINT 26 (MISCELLANEOUS) ×6 IMPLANT
CLEANER TIP ELECTROSURG 2X2 (MISCELLANEOUS) ×3 IMPLANT
CLOSURE WOUND 1/2 X4 (GAUZE/BANDAGES/DRESSINGS) ×1
COVER BACK TABLE 60X90IN (DRAPES) ×3 IMPLANT
COVER SURGICAL LIGHT HANDLE (MISCELLANEOUS) ×3 IMPLANT
COVER WAND RF STERILE (DRAPES) IMPLANT
DRAPE INCISE IOBAN 66X45 STRL (DRAPES) ×3 IMPLANT
DRAPE ORTHO SPLIT 77X108 STRL (DRAPES) ×4
DRAPE POUCH INSTRU U-SHP 10X18 (DRAPES) ×3 IMPLANT
DRAPE SURG 17X11 SM STRL (DRAPES) ×3 IMPLANT
DRAPE SURG ORHT 6 SPLT 77X108 (DRAPES) ×2 IMPLANT
DRAPE U-SHAPE 47X51 STRL (DRAPES) ×3 IMPLANT
DRSG ADAPTIC 3X8 NADH LF (GAUZE/BANDAGES/DRESSINGS) ×3 IMPLANT
DRSG AQUACEL AG ADV 3.5X 6 (GAUZE/BANDAGES/DRESSINGS) ×3 IMPLANT
ELECT BLADE TIP CTD 4 INCH (ELECTRODE) ×3 IMPLANT
ELECT REM PT RETURN 15FT ADLT (MISCELLANEOUS) IMPLANT
FACESHIELD WRAPAROUND (MASK) ×6 IMPLANT
GLENOID PEG SHOULDER 40MM SML (Shoulder) ×1 IMPLANT
GLOVE BIO SURGEON STRL SZ7 (GLOVE) ×3 IMPLANT
GLOVE BIO SURGEON STRL SZ7.5 (GLOVE) ×3 IMPLANT
GLOVE BIOGEL PI IND STRL 7.0 (GLOVE) ×1 IMPLANT
GLOVE BIOGEL PI IND STRL 8 (GLOVE) ×1 IMPLANT
GLOVE BIOGEL PI INDICATOR 7.0 (GLOVE) ×2
GLOVE BIOGEL PI INDICATOR 8 (GLOVE) ×2
GOWN STRL REUS W/TWL LRG LVL3 (GOWN DISPOSABLE) ×3 IMPLANT
GOWN STRL REUS W/TWL XL LVL3 (GOWN DISPOSABLE) ×3 IMPLANT
GUIDEWIRE GLENOID 2.5X220 (WIRE) ×3 IMPLANT
HANDPIECE INTERPULSE COAX TIP (DISPOSABLE) ×2
HEAD HUMERAL HIGH OS 46X17 (Shoulder) ×1 IMPLANT
HEMOSTAT SURGICEL 2X14 (HEMOSTASIS) ×3 IMPLANT
HOOD PEEL AWAY FLYTE STAYCOOL (MISCELLANEOUS) ×9 IMPLANT
HUMERAL HEAD AEQUALIS 46X17 (Shoulder) ×3 IMPLANT
KIT BASIN OR (CUSTOM PROCEDURE TRAY) ×3 IMPLANT
KIT TURNOVER KIT A (KITS) IMPLANT
MANIFOLD NEPTUNE II (INSTRUMENTS) ×3 IMPLANT
NEEDLE MA TROC 1/2 (NEEDLE) ×3 IMPLANT
NS IRRIG 1000ML POUR BTL (IV SOLUTION) ×3 IMPLANT
PACK SHOULDER (CUSTOM PROCEDURE TRAY) ×3 IMPLANT
PROTECTOR NERVE ULNAR (MISCELLANEOUS) ×3 IMPLANT
RESTRAINT HEAD UNIVERSAL NS (MISCELLANEOUS) ×3 IMPLANT
RETRIEVER SUT HEWSON (MISCELLANEOUS) ×3 IMPLANT
SET HNDPC FAN SPRY TIP SCT (DISPOSABLE) ×1 IMPLANT
SHOULDER GLENOID PEG 40MM SML (Shoulder) ×3 IMPLANT
SLING ARM IMMOBILIZER LRG (SOFTGOODS) ×3 IMPLANT
SMARTMIX MINI TOWER (MISCELLANEOUS)
SPONGE LAP 18X18 RF (DISPOSABLE) ×3 IMPLANT
STANDARD PTC HUMERAL STEM (Orthopedic Implant) ×3 IMPLANT
STAPLER VISISTAT 35W (STAPLE) IMPLANT
STEM HUMERAL AEQUALIS 70XS2C (Stem) ×3 IMPLANT
STRIP CLOSURE SKIN 1/2X4 (GAUZE/BANDAGES/DRESSINGS) ×2 IMPLANT
SUCTION FRAZIER HANDLE 12FR (TUBING) ×2
SUCTION TUBE FRAZIER 12FR DISP (TUBING) ×1 IMPLANT
SUPPORT WRAP ARM LG (MISCELLANEOUS) ×3 IMPLANT
SUT ETHIBOND 2 V 37 (SUTURE) ×3 IMPLANT
SUT MNCRL AB 4-0 PS2 18 (SUTURE) ×3 IMPLANT
SUT VIC AB 2-0 CT1 27 (SUTURE) ×2
SUT VIC AB 2-0 CT1 TAPERPNT 27 (SUTURE) ×1 IMPLANT
TAPE LABRALWHITE 1.5X36 (TAPE) ×3 IMPLANT
TAPE SUT LABRALTAP WHT/BLK (SUTURE) ×3 IMPLANT
TOWEL OR 17X26 10 PK STRL BLUE (TOWEL DISPOSABLE) ×3 IMPLANT
TOWEL OR NON WOVEN STRL DISP B (DISPOSABLE) ×3 IMPLANT
TOWER SMARTMIX MINI (MISCELLANEOUS) IMPLANT
WATER STERILE IRR 1000ML POUR (IV SOLUTION) ×6 IMPLANT

## 2019-01-22 NOTE — Discharge Instructions (Signed)
Discharge Instructions after Total Shoulder Arthroplasty   . A sling has been provided for you. Remove the sling 5 times each day to perform motion exercises. Use ice on the shoulder intermittently over the first 48 hours after surgery.  . Pain medication has been prescribed for you.  . Use your medication liberally over the first 48 hours, and then begin to taper your use. You may take Extra Strength Tylenol or Tylenol only in place of the pain pills. DO NOT take ANY nonsteroidal anti-inflammatory pain medications: Advil, Motrin, Ibuprofen, Aleve, Naproxen, or Naprosyn. . Take one aspirin a day for 2 weeks after surgery, unless you have an aspirin sensitivity/allergy or asthma. . Leave your dressing on until your first follow up visit.  You may shower with the dressing.  Hold your arm as if you still have your sling on while you shower. . Active reaching and lifting are not permitted. You may use the operative arm for activities of daily living that do not require the operative arm to leave the side of the body, such as eating, drinking, bathing, etc.  . Three to 5 times each day you should perform assisted overhead reaching and external rotation (outward turning) exercises with the operative arm. You were taught these exercises prior to discharge. Both exercises should be done with the non-operative arm used as the "therapist arm" while the operative arm remains relaxed. Ten of each exercise should be done three to five times each day.   Overhead reach is helping to lift your stiff arm up as high as it will go. To stretch your overhead reach, lie flat on your back, relax, and grasp the wrist of the tight shoulder with your opposite hand. Using the power in your opposite arm, bring the stiff arm up as far as it is comfortable. Start holding it for ten seconds and then work up to where you can hold it for a count of 30. Breathe slowly and deeply while the arm is moved. Repeat this stretch ten times,  trying to help the ar up a little higher each time.     External rotation is turning the arm out to the side while your elbow stays close to your body. External rotation is best stretched while you are lying on your back. Hold a cane, yardstick, broom handle, or dowel in both hands. Bend both elbows to a right angle. Use steady, gentle force from your normal arm to rotate the hand of the stiff shoulder out away from your body. Continue the rotation until it is straight in front of you holding it there for a count of 10. Do not go beyond this level of rotation until seen back by Dr. Chandler. Repeat this exercise ten times slowly.      Please call [phone] during normal business hours or [phone] after hours for any problems. Including the following:  - excessive redness of the incisions - drainage for more than 4 days - fever of more than 101.5 F  *Please note that pain medications will not be refilled after hours or on weekends.    

## 2019-01-22 NOTE — Plan of Care (Signed)
progressing 

## 2019-01-22 NOTE — Transfer of Care (Signed)
Immediate Anesthesia Transfer of Care Note  Patient: Ashley Dyer  Procedure(s) Performed: TOTAL SHOULDER ARTHROPLASTY (Left )  Patient Location: PACU  Anesthesia Type:GA combined with regional for post-op pain  Level of Consciousness: awake, drowsy and patient cooperative  Airway & Oxygen Therapy: Patient Spontanous Breathing and Patient connected to face mask oxygen  Post-op Assessment: Report given to RN and Post -op Vital signs reviewed and stable  Post vital signs: Reviewed and stable  Last Vitals:  Vitals Value Taken Time  BP 108/63 01/22/19 0938  Temp    Pulse 70 01/22/19 0940  Resp 18 01/22/19 0940  SpO2 100 % 01/22/19 0940  Vitals shown include unvalidated device data.  Last Pain:  Vitals:   01/22/19 0625  TempSrc:   PainSc: 0-No pain         Complications: No apparent anesthesia complications

## 2019-01-22 NOTE — Anesthesia Procedure Notes (Addendum)
Procedure Name: Intubation Date/Time: 01/22/2019 7:36 AM Performed by: Silas Sacramento, CRNA Pre-anesthesia Checklist: Patient identified, Emergency Drugs available, Suction available and Patient being monitored Patient Re-evaluated:Patient Re-evaluated prior to induction Oxygen Delivery Method: Circle system utilized Preoxygenation: Pre-oxygenation with 100% oxygen Induction Type: IV induction Laryngoscope Size: Mac and 4 Grade View: Grade I Tube type: Oral Tube size: 7.0 mm Number of attempts: 1 Airway Equipment and Method: Stylet and Oral airway Placement Confirmation: ETT inserted through vocal cords under direct vision,  positive ETCO2 and breath sounds checked- equal and bilateral Secured at: 24 cm Tube secured with: Tape Dental Injury: Teeth and Oropharynx as per pre-operative assessment

## 2019-01-22 NOTE — Anesthesia Procedure Notes (Signed)
Anesthesia Procedure Image    

## 2019-01-22 NOTE — Anesthesia Postprocedure Evaluation (Signed)
Anesthesia Post Note  Patient: Ashley Dyer  Procedure(s) Performed: TOTAL SHOULDER ARTHROPLASTY (Left )     Patient location during evaluation: PACU Anesthesia Type: General Level of consciousness: awake and alert Pain management: pain level controlled Vital Signs Assessment: post-procedure vital signs reviewed and stable Respiratory status: spontaneous breathing, nonlabored ventilation, respiratory function stable and patient connected to nasal cannula oxygen Cardiovascular status: blood pressure returned to baseline and stable Postop Assessment: no apparent nausea or vomiting Anesthetic complications: no    Last Vitals:  Vitals:   01/22/19 1143 01/22/19 1251  BP: (!) 100/52 (!) 102/52  Pulse: 78 78  Resp: 16   Temp:  (!) 36.4 C  SpO2: 95% 93%    Last Pain:  Vitals:   01/22/19 1251  TempSrc: Oral  PainSc:                  [name] S

## 2019-01-22 NOTE — Op Note (Signed)
Procedure(s): TOTAL SHOULDER ARTHROPLASTY Procedure Note  Ashley Dyer female 73 y.o. 01/22/2019  Procedure(s) and Anesthesia Type:    LEFT TOTAL SHOULDER ARTHROPLASTY - Choice  Surgeon(s) and Role:    Tania Ade, MD - Primary   Indications:  73 y.o. female  With endstage left shoulder arthritis. Pain and dysfunction interfered with quality of life and nonoperative treatment with activity modification, NSAIDS and injections failed.     Surgeon: Isabella Stalling   Assistants: Jeanmarie Hubert PA-C [facility] was present and scrubbed throughout the procedure and was essential in positioning, retraction, exposure, and closure)   Anesthesia: General endotracheal anesthesia with preoperative interscalene block given by the attending anesthesiologist    Procedure Detail  TOTAL SHOULDER ARTHROPLASTY  Findings: Tornier flex anatomic hybrid cement press-fit size 2 stem with a 46 high offset head, cemented size 40 small Cortiloc glenoid.   A lesser tuberosity osteotomy was performed and repaired at the conclusion of the procedure.  Estimated Blood Loss:  200 mL         Drains: None   Blood Given: none          Specimens: none        Complications:  * No complications entered in OR log *         Disposition: PACU - hemodynamically stable.         Condition: stable    Procedure:   The patient was identified in the preoperative holding area where I personally marked the operative extremity after verifying with the patient and consent. She  was taken to the operating room where She was transferred to the   operative table.  The patient received an interscalene block in   the holding area by the attending anesthesiologist.  General anesthesia was induced   in the operating room without complication.  The patient did receive IV  Ancef prior to the commencement of the procedure.  The patient was   placed in the beach-chair position with the back raised about 30   degrees.  The nonoperative extremity and head and neck were carefully   positioned and padded protecting against neurovascular compromise.  The   left upper extremity was then prepped and draped in the standard sterile   fashion.    The appropriate operative time-out was performed with   Anesthesia, the perioperative staff, as well as myself and we all agreed   that the left side was the correct operative site.  An approximately   10 cm incision was made from the tip of the coracoid to the center point of the   humerus at the level of the axilla.  Dissection was carried down sharply   through subcutaneous tissues and cephalic vein was identified and taken   laterally with the deltoid.  The pectoralis major was taken medially.  The   upper 1 cm of the pectoralis major was released from its attachment on   the humerus.  The clavipectoral fascia was incised just lateral to the   conjoined tendon.  This incision was carried up to but not into the   coracoacromial ligament.  Digital palpation was used to prove   integrity of the axillary nerve which was protected throughout the   procedure.  Musculocutaneous nerve was not palpated in the operative   field.  Conjoined tendon was then retracted gently medially and the   deltoid laterally.  Anterior circumflex humeral vessels were clamped and   coagulated.  The soft tissues overlying  the biceps was incised and this   incision was carried across the transverse humeral ligament to the base   of the coracoid.  The biceps was noted to be severely degenerated. It was released from the superior labrum.  An osteotomy was performed at the lesser tuberosity.  Bone quality was noted to be very porous and thin.  The capsule was then   released all the way down to the 6 o'clock position of the humeral head.   The humeral head was then delivered with simultaneous adduction,   extension and external rotation.  All humeral osteophytes were removed   and the  anatomic neck of the humerus was marked and cut free hand at   approximately 25 degrees retroversion within about 3 mm of the cuff   reflection posteriorly.  The head size was estimated to be a 46 medium   offset.  At that point, the humeral head was retracted posteriorly with   a Fukuda retractor.   Remaining portion of the capsule was released at the base of the   coracoid.  The remaining biceps anchor and the entire anterior-inferior   labrum was excised.  The posterior labrum was also excised but the   posterior capsule was not released.  The guidepin was placed bicortically with non elevated guide.  The reamer was used to ream to concentric bone with punctate bleeding.  This gave an excellent concentric surface.  The center hole was then drilled for an anchor peg glenoid followed by the three peripheral holes and none of the holes   exited the glenoid wall.  I then pulse irrigated these holes and dried   them with Surgicel.  The three peripheral holes were then   pressurized cemented and the anchor peg glenoid was placed and impacted   with an excellent fit.  The glenoid was a 40s component.  The proximal humerus was then again exposed taking care not to displace the glenoid.    Given the very thin and poor quality proximal bone there was a small amount of compression of the anterior cortex.  The entry awl was used followed by sounding reamers and then sequentially broached from size 1 to 2. This was then left in place and the calcar planer was used. Trial head was placed with a 46.  With the trial implantation of the component,  there was approximately 50% posterior translation with immediate snap back to the   anatomic position.  With forward elevation, there was no tendency   towards posterior subluxation.   The trial was removed and the final implant was prepared on a back table.  The trial was removed and the final implant was prepared on a back table.   3 small holes were drilled on the  medial side of the lesser tuberosity osteotomy, through which 2 labral tapes were passed.  Given the very thin proximal bone and small area of cortical impaction anteriorly I felt that relying on press-fit alone would be risky.  Therefore I felt a hybrid cement technique was our best option.  Batch of cement was mixed and once semi-firm wrapped around the inferior aspect of the implant prior to impacting it. The implant was then placed through the loop of the 2 labral tapes and impacted with an excellent press-fit. This achieved excellent anatomic reconstruction of the proximal humerus.  The joint was then copiously irrigated with pulse lavage.  The subscapularis and   lesser tuberosity osteotomy were then repaired using the 2  labral tapes previously passed in a double row fashion with horizontal mattress sutures medially brought over through bone tunnels tied over a bone bridge laterally.   One #1 Ethibond was placed at the rotator interval just above   the lesser tuberosity. Copious irrigation was used. Skin was closed with 2-0 Vicryl sutures in the deep dermal layer and 4-0 Monocryl in a subcuticular  running fashion.  Sterile dressings were then applied including Aquacel.  The patient was placed in a sling and allowed to awaken from general anesthesia and taken to the recovery room in stable condition.      POSTOPERATIVE PLAN:  Early passive range of motion will be allowed with the goal of 0 degrees external rotation and 90 degrees forward elevation.  No internal rotation at this time.  No active motion of the arm until the lesser tuberosity heals.  The patient will likely be kept in the hospital for 1-2 days and then discharged home.

## 2019-01-22 NOTE — H&P (Signed)
Ashley Dyer is an 73 y.o. female.   Chief Complaint: L shoulder pain and dysfunction HPI: Endstage L shoulder arthritis with significant pain and dysfunction, failed conservative measures.  Pain interferes with sleep and quality of life.  Past Medical History:  Diagnosis Date  . Allergy   . Anxiety    ED- visit for anxiety  . Arthritis    OA- shoulder(both) , hands  . Asthma   . Complication of anesthesia    fear- of feeling paralyzed - 1980's   . History of kidney stones 1980's   surgery  . Hyperlipidemia   . Hypertension   . 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 CATARACT EXTRACTION Bilateral   . KIDNEY STONE SURGERY    . TOTAL SHOULDER ARTHROPLASTY Right 06/13/2017  . TOTAL SHOULDER ARTHROPLASTY Right 06/13/2017   Procedure: TOTAL SHOULDER ARTHROPLASTY;  Surgeon: Tania Ade, MD;  Location: [place];  Service: Orthopedics;  Laterality: Right;  Right total shoulder arthroplasty  . VAGINAL DELIVERY  1976    Family History  Problem Relation Age of Onset  . Atrial fibrillation Mother   . Heart disease Father   . Colon cancer Paternal Grandfather   . Leukemia Brother   . Breast cancer Neg Hx    Social History:  reports that she quit smoking about 30 years ago. She has never used smokeless tobacco. She reports current alcohol use of about 8.0 - 10.0 standard drinks of alcohol per week. She reports that she does not use drugs.  Allergies: No Known Allergies  Medications Prior to Admission  Medication Sig Dispense Refill  . Alirocumab (PRALUENT) 75 MG/ML SOAJ Inject 75 mg as directed every 14 (fourteen) days. 2 mL 11  . Ascorbic Acid (VITAMIN C) 1000 MG tablet Take 1,000 mg by mouth daily.    Marland Kitchen CALCIUM CITRATE PO Take 1 capsule by mouth daily.    . Cholecalciferol (VITAMIN D3) 50 MCG (2000 UT) capsule Take 4,000 Units by mouth daily.     . cromolyn (NASALCROM) 5.2 MG/ACT nasal spray Place 1 spray into both  nostrils daily.     . fluticasone (FLOVENT HFA) 110 MCG/ACT inhaler Inhale 1 puff into the lungs 2 (two) times daily as needed (wheezing or shortness of breath).     . meloxicam (MOBIC) 15 MG tablet Take 15 mg by mouth daily.     Marland Kitchen sulfamethoxazole-trimethoprim (BACTRIM) 400-80 MG tablet Take 1 tablet by mouth 2 (two) times daily.    Marland Kitchen triamterene-hydrochlorothiazide (MAXZIDE) 75-50 MG tablet Take 1 tablet by mouth See admin instructions. Take 1 tablet by mouth every other day alternating with 1/2 tablet the other days (Patient taking differently: Take 0.5-1 tablets by mouth See admin instructions. Take 1 tablet by mouth every other day alternating with 1/2 tablet the other days) 30 tablet 0  . albuterol (PROVENTIL HFA;VENTOLIN HFA) 108 (90 Base) MCG/ACT inhaler Inhale 1 puff into the lungs every 6 (six) hours as needed for wheezing or shortness of breath.      No results found for this or any previous visit (from the past 48 hour(s)). No results found.  Review of Systems  All other systems reviewed and are negative.   Blood pressure 138/65, pulse 80, temperature 98 F (36.7 C), temperature source Oral, resp. rate 18, height 5\' 6"  (1.676 m), weight 71.8 kg, SpO2 100 %. Physical Exam  Constitutional: She is oriented to person, place, and time. She appears well-developed and  well-nourished.  HENT:  Head: Atraumatic.  Eyes: EOM are normal.  Cardiovascular: Intact distal pulses.  Respiratory: Effort normal.  Musculoskeletal:     Comments: L shoulder pain with limited ROM. NVID.  Neurological: She is alert and oriented to person, place, and time.  Skin: Skin is warm and dry.  Psychiatric: She has a normal mood and affect.     Assessment/Plan Endstage L shoulder arthritis with significant pain and dysfunction, failed conservative measures.  Pain interferes with sleep and quality of life. Plan L total shoulder replacement Risks / benefits of surgery discussed Consent on chart  NPO for  OR Preop antibiotics   Isabella Stalling, MD 01/22/2019, 7:03 AM

## 2019-01-22 NOTE — Anesthesia Procedure Notes (Signed)
Anesthesia Regional Block: Interscalene brachial plexus block   Pre-Anesthetic Checklist: ,, timeout performed, Correct Patient, Correct Site, Correct Laterality, Correct Procedure, Correct Position, site marked, Risks and benefits discussed,  Surgical consent,  Pre-op evaluation,  At surgeon's request and post-op pain management  Laterality: Left  Prep: chloraprep       Needles:  Injection technique: Single-shot  Needle Type: Echogenic Needle     Needle Length: 9cm      Additional Needles:   Procedures:,,,, ultrasound used (permanent image in chart),,,,  Narrative:  Start time: 01/22/2019 7:02 AM End time: 01/22/2019 7:14 AM Injection made incrementally with aspirations every 5 mL.  Performed by: Personally  Anesthesiologist: Myrtie Soman, MD  Additional Notes: Patient tolerated the procedure well without complications

## 2019-01-23 ENCOUNTER — Encounter (HOSPITAL_COMMUNITY): Payer: Self-pay | Admitting: Orthopedic Surgery

## 2019-01-23 LAB — BASIC METABOLIC PANEL
Anion gap: 10 (ref 5–15)
BUN: 20 mg/dL (ref 8–23)
CO2: 23 mmol/L (ref 22–32)
Calcium: 8.5 mg/dL — ABNORMAL LOW (ref 8.9–10.3)
Chloride: 105 mmol/L (ref 98–111)
Creatinine, Ser: 0.85 mg/dL (ref 0.44–1.00)
GFR calc Af Amer: >60 mL/min (ref >60)
GFR calc non Af Amer: >60 mL/min (ref >60)
Glucose, Bld: 121 mg/dL — ABNORMAL HIGH (ref 70–99)
Potassium: 4 mmol/L (ref 3.5–5.1)
Sodium: 138 mmol/L (ref 135–145)

## 2019-01-23 LAB — CBC
HCT: 35.5 % — ABNORMAL LOW (ref 36.0–46.0)
Hemoglobin: 11.5 g/dL — ABNORMAL LOW (ref 12.0–15.0)
MCH: 31 pg (ref 26.0–34.0)
MCHC: 32.4 g/dL (ref 30.0–36.0)
MCV: 95.7 fL (ref 80.0–100.0)
Platelets: 182 10*3/uL (ref 150–400)
RBC: 3.71 MIL/uL — ABNORMAL LOW (ref 3.87–5.11)
RDW: 13.5 % (ref 11.5–15.5)
WBC: 10.4 10*3/uL (ref 4.0–10.5)
nRBC: 0 % (ref 0.0–0.2)

## 2019-01-23 MED ORDER — OXYCODONE-ACETAMINOPHEN 5-325 MG PO TABS: 1.0000 | ORAL_TABLET | ORAL | 0 refills | Status: DC | PRN

## 2019-01-23 MED ORDER — TIZANIDINE HCL 2 MG PO TABS: 2.0000 mg | ORAL_TABLET | Freq: Three times a day (TID) | ORAL | 0 refills | Status: DC

## 2019-01-23 NOTE — Evaluation (Signed)
Occupational Therapy Evaluation Patient Details Name: Ashley Dyer MRN: [id number] DOB: 02-Dec-1945 Today's Date: 01/23/2019    History of Present Illness s/p L TSA, h/o R TSA   Clinical Impression   This 73 year old female was admitted for the above sx.  Worked through ADL and allowed exercises.  Pt needs cues not to activate LUE.  She verbalizes understanding of all    Follow Up Recommendations  Follow surgeon's recommendation for DC plan and follow-up therapies    Equipment Recommendations  None recommended by OT    Recommendations for Other Services       Precautions / Restrictions Precautions Precautions: Shoulder Type of Shoulder Precautions: immobilizer on except for bathing/dressing and exercises.  PROM FF 0-90 and ER to neutral.  NO ABDUCTION.  may move elbow wrist and hand Shoulder Interventions: Shoulder sling/immobilizer Precaution Booklet Issued: Yes (comment) Restrictions Weight Bearing Restrictions: Yes Other Position/Activity Restrictions: NWB      Mobility Bed Mobility               General bed mobility comments: supervision from flat bed  Transfers Overall transfer level: Independent                    Balance                                           ADL either performed or assessed with clinical judgement   ADL Overall ADL's : Needs assistance/impaired Eating/Feeding: Set up   Grooming: Set up   Upper Body Bathing: Moderate assistance   Lower Body Bathing: Supervison/ safety   Upper Body Dressing : Moderate assistance   Lower Body Dressing: Supervision/safety(no socks)   Toilet Transfer: Minimal assistance;Ambulation;Min guard(steadying assistance to close guard)   Toileting- Clothing Manipulation and Hygiene: Minimal assistance         General ADL Comments: performed ADL, worked through exercises.  Reviewed protocol and pt verbalizes understanding.  Cues not to activate LUE     Vision          Perception     Praxis      Pertinent Vitals/Pain Pain Assessment: No/denies pain     Hand Dominance Right   Extremity/Trunk Assessment Upper Extremity Assessment Upper Extremity Assessment: LUE deficits/detail(able to move elbow to fingers--decreased control)           Communication Communication Communication: No difficulties   Cognition Arousal/Alertness: Awake/alert Behavior During Therapy: WFL for tasks assessed/performed                                   General Comments: gets distracted; cues to not actively move arm   General Comments       Exercises     Shoulder Instructions      Home Living Family/patient expects to be discharged to:: Private residence Living Arrangements: Spouse/significant other Available Help at Discharge: Family;Available PRN/intermittently               Bathroom Shower/Tub: Walk-in shower   Bathroom Toilet: Handicapped height     Home Equipment: Shower seat   Additional Comments: daughter will also help      Prior Functioning/Environment Level of Independence: Independent                 OT Problem List:  OT Treatment/Interventions:      OT Goals(Current goals can be found in the care plan section) Acute Rehab OT Goals Patient Stated Goal: return to independence OT Goal Formulation: All assessment and education complete, DC therapy  OT Frequency:     Barriers to D/C:            Co-evaluation              AM-PAC OT "6 Clicks" Daily Activity     Outcome Measure Help from another person eating meals?: A Little Help from another person taking care of personal grooming?: A Little Help from another person toileting, which includes using toliet, bedpan, or urinal?: A Little Help from another person bathing (including washing, rinsing, drying)?: A Lot Help from another person to put on and taking off regular upper body clothing?: A Lot Help from another person to put on and  taking off regular lower body clothing?: A Little 6 Click Score: 16   End of Session Nurse Communication: (OT complete)  Activity Tolerance: Patient tolerated treatment well Patient left: in chair;with call bell/phone within reach  OT Visit Diagnosis: Muscle weakness (generalized) (M62.81)                Time: 9935-7017 OT Time Calculation (min): 40 min Charges:  OT General Charges $OT Visit: 1 Visit OT Evaluation $OT Eval Low Complexity: 1 Low OT Treatments $Self Care/Home Management : 8-22 mins $Therapeutic Exercise: 8-22 mins  Lesle Chris, OTR/L Acute Rehabilitation Services [phone] [facility] pager [phone] office 01/23/2019  [place] 01/23/2019, 9:47 AM

## 2019-01-23 NOTE — Progress Notes (Signed)
   PATIENT ID: Ashley Dyer   1 Day Post-Op Procedure(s) (LRB): TOTAL SHOULDER ARTHROPLASTY (Left)  Subjective: Doing well this am. Minimal pain. No complaints or concerns.   Objective:  Vitals:   01/23/19 0524 01/23/19 0821  BP: (!) 119/58 119/61  Pulse: 72 79  Resp: 16   Temp: 98 F (36.7 C)   SpO2: 98%      L UE dressing c/d/i Wiggles fingers  Labs:  Recent Labs    01/23/19 0303  HGB 11.5*   Recent Labs    01/23/19 0303  WBC 10.4  RBC 3.71*  HCT 35.5*  PLT 182   Recent Labs    01/23/19 0303  NA 138  K 4.0  CL 105  CO2 23  BUN 20  CREATININE 0.85  GLUCOSE 121*  CALCIUM 8.5*    Assessment and Plan: 1 day s/p TSA D/c home when cleared by PT Follow up with Dr. Tamera Punt in 2 weeks  VTE proph: asa, scds

## 2019-01-23 NOTE — Discharge Summary (Signed)
Patient ID: Ashley Dyer MRN: [id number] DOB/AGE: 04-09-1946 73 y.o.  Admit date: 01/22/2019 Discharge date: 01/23/2019  Admission Diagnoses:  Active Problems:   Status post total shoulder arthroplasty, left   Discharge Diagnoses:  Same  Past Medical History:  Diagnosis Date  . Allergy   . Anxiety    ED- visit for anxiety  . Arthritis    OA- shoulder(both) , hands  . Asthma   . Complication of anesthesia    fear- of feeling paralyzed - 1980's   . History of kidney stones 1980's   surgery  . Hyperlipidemia   . Hypertension   . 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 female who was admitted 01/22/2019 for operative treatment of left shoulder end stage OA. Patient has severe unremitting pain that affects sleep, daily activities, and work/hobbies. After pre-op clearance the patient was taken to the operating room on 01/22/2019 and underwent  Procedure(s): TOTAL SHOULDER ARTHROPLASTY.    Patient was given perioperative antibiotics:  Anti-infectives (From admission, onward)   Start     Dose/Rate Route Frequency Ordered Stop   01/22/19 1800  sulfamethoxazole-trimethoprim (BACTRIM) 400-80 MG per tablet 1 tablet     1 tablet Oral 2 times daily 01/22/19 1103     01/22/19 1400  ceFAZolin (ANCEF) IVPB 2g/100 mL premix     2 g 200 mL/hr over 30 Minutes Intravenous Every 6 hours 01/22/19 1103 01/23/19 0437   01/22/19 0615  ceFAZolin (ANCEF) IVPB 2g/100 mL premix     2 g 200 mL/hr over 30 Minutes Intravenous On call to O.R. 01/22/19 0606 01/22/19 0750       Patient was given sequential compression devices, early ambulation, and asa to prevent DVT.  Patient benefited maximally from hospital stay and there were no complications.    Recent vital signs:  Patient Vitals for the past 24 hrs:  BP Temp Temp src  Pulse Resp SpO2  01/23/19 0821 119/61 - - 79 - -  01/23/19 0524 (!) 119/58 98 F (36.7 C) Oral 72 16 98 %  01/23/19 0051 120/82 98.4 F (36.9 C) Oral 86 16 98 %  01/22/19 2057 98/65 97.8 F (36.6 C) Oral 82 16 97 %  01/22/19 1747 131/66 97.9 F (36.6 C) - 90 16 100 %  01/22/19 1356 (!) 95/57 97.7 F (36.5 C) Oral 79 16 96 %  01/22/19 1251 (!) 102/52 (!) 97.5 F (36.4 C) Oral 78 - 93 %  01/22/19 1143 (!) 100/52 - - 78 16 95 %  01/22/19 1044 (!) 104/57 (!) 97.5 F (36.4 C) Oral 76 14 96 %  01/22/19 1015 (!) 110/52 - - 77 15 92 %  01/22/19 1000 (!) 112/55 (!) 97.4 F (36.3 C) - 76 15 95 %  01/22/19 0953 - - - - - 100 %  01/22/19 0945 (!) 111/51 - - 74 14 100 %  01/22/19 0938 108/63 (!) 97.4 F (36.3 C) - 79 14 100 %     Recent laboratory studies:  Recent Labs    01/23/19 0303  WBC 10.4  HGB 11.5*  HCT 35.5*  PLT 182  NA 138  K 4.0  CL 105  CO2 23  BUN 20  CREATININE 0.85  GLUCOSE 121*  CALCIUM 8.5*     Discharge Medications:   Allergies as of 01/23/2019   No Known Allergies  Medication List    STOP taking these medications   meloxicam 15 MG tablet Commonly known as: MOBIC     TAKE these medications   albuterol 108 (90 Base) MCG/ACT inhaler Commonly known as: VENTOLIN HFA Inhale 1 puff into the lungs every 6 (six) hours as needed for wheezing or shortness of breath.   Alirocumab 75 MG/ML Soaj Commonly known as: Praluent Inject 75 mg as directed every 14 (fourteen) days.   CALCIUM CITRATE PO Take 1 capsule by mouth daily.   cromolyn 5.2 MG/ACT nasal spray Commonly known as: NASALCROM Place 1 spray into both nostrils daily.   fluticasone 110 MCG/ACT inhaler Commonly known as: FLOVENT HFA Inhale 1 puff into the lungs 2 (two) times daily as needed (wheezing or shortness of breath).   oxyCODONE-acetaminophen 5-325 MG tablet Commonly known as: Percocet Take 1 tablet by mouth every 4 (four) hours as needed for severe pain.    sulfamethoxazole-trimethoprim 400-80 MG tablet Commonly known as: BACTRIM Take 1 tablet by mouth 2 (two) times daily.   tiZANidine 2 MG tablet Commonly known as: ZANAFLEX Take 1 tablet (2 mg total) by mouth 3 (three) times daily.   triamterene-hydrochlorothiazide 75-50 MG tablet Commonly known as: MAXZIDE Take 1 tablet by mouth See admin instructions. Take 1 tablet by mouth every other day alternating with 1/2 tablet the other days What changed: how much to take   vitamin C 1000 MG tablet Take 1,000 mg by mouth daily.   Vitamin D3 50 MCG (2000 UT) capsule Take 4,000 Units by mouth daily.       Diagnostic Studies: Dg Chest 2 View  Result Date: 01/19/2019 CLINICAL DATA:  Pre-op evaluation for left shoulder replacement. History of hypertension and asthma. EXAM: CHEST - 2 VIEW COMPARISON:  Radiographs 06/12/2017. FINDINGS: The heart size and mediastinal contours are stable. There are scattered right lung granulomas which are stable. The lungs are otherwise clear. There is no pleural effusion or pneumothorax. No acute osseous findings are seen status post interval right shoulder arthroplasty. IMPRESSION: Stable chest without evidence of active cardiopulmonary process. Electronically Signed   By: Richardean Sale M.D.   On: 01/19/2019 14:53   Dg Shoulder Left Port  Result Date: 01/22/2019 CLINICAL DATA:  Status post LEFT shoulder arthroplasty. EXAM: LEFT SHOULDER - 1 VIEW COMPARISON:  Chest x-ray dated 01/19/2019. FINDINGS: Interval LEFT shoulder arthroplasty. Hardware appears intact and appropriately positioned. Osseous alignment is anatomic. Soft tissues about the LEFT shoulder are unremarkable. IMPRESSION: Status post LEFT shoulder arthroplasty. Hardware appears intact and appropriately positioned. No evidence of surgical complicating feature. Electronically Signed   By: Franki Cabot M.D.   On: 01/22/2019 10:19    Disposition:     Follow-up Information    Tania Ade, MD.  Schedule an appointment as soon as possible for a visit in 2 weeks.   Specialty: Orthopedic Surgery Contact information: [address] [place] [address] [phone]            Signed: Grier Mitts 01/23/2019, 8:25 AM

## 2019-01-27 ENCOUNTER — Other Ambulatory Visit: Payer: Self-pay | Admitting: Family Medicine

## 2019-02-13 ENCOUNTER — Encounter: Payer: Self-pay | Admitting: Cardiovascular Disease

## 2019-02-13 ENCOUNTER — Telehealth: Payer: Self-pay | Admitting: Cardiovascular Disease

## 2019-02-13 NOTE — Telephone Encounter (Signed)

## 2019-02-13 NOTE — Telephone Encounter (Signed)
New Message ° ° ° °Left message to confirm appt and answer covid questions  °

## 2019-02-16 ENCOUNTER — Other Ambulatory Visit: Payer: Self-pay

## 2019-02-16 ENCOUNTER — Encounter: Payer: Self-pay | Admitting: Cardiovascular Disease

## 2019-02-16 ENCOUNTER — Ambulatory Visit: Payer: Medicare Other | Admitting: Cardiovascular Disease

## 2019-02-16 VITALS — BP 140/78 | HR 73 | Ht 66.0 in | Wt 160.4 lb

## 2019-02-16 DIAGNOSIS — I10 Essential (primary) hypertension: Secondary | ICD-10-CM

## 2019-02-16 DIAGNOSIS — E785 Hyperlipidemia, unspecified: Secondary | ICD-10-CM

## 2019-02-16 LAB — HEPATIC FUNCTION PANEL
ALT: 17 IU/L (ref 0–32)
AST: 29 IU/L (ref 0–40)
Albumin: 4.7 g/dL (ref 3.7–4.7)
Alkaline Phosphatase: 116 IU/L (ref 39–117)
Bilirubin Total: 0.4 mg/dL (ref 0.0–1.2)
Bilirubin, Direct: 0.14 mg/dL (ref 0.00–0.40)
Total Protein: 7.2 g/dL (ref 6.0–8.5)

## 2019-02-16 LAB — LIPID PANEL
Chol/HDL Ratio: 2.7 ratio (ref 0.0–4.4)
Cholesterol, Total: 190 mg/dL (ref 100–199)
HDL: 70 mg/dL (ref >39)
LDL Calculated: 83 mg/dL (ref 0–99)
Triglycerides: 187 mg/dL — ABNORMAL HIGH (ref 0–149)
VLDL Cholesterol Cal: 37 mg/dL (ref 5–40)

## 2019-02-16 LAB — BASIC METABOLIC PANEL
BUN/Creatinine Ratio: 31 — ABNORMAL HIGH (ref 12–28)
BUN: 22 mg/dL (ref 8–27)
CO2: 21 mmol/L (ref 20–29)
Calcium: 9.9 mg/dL (ref 8.7–10.3)
Chloride: 102 mmol/L (ref 96–106)
Creatinine, Ser: 0.7 mg/dL (ref 0.57–1.00)
GFR calc Af Amer: 99 mL/min/{1.73_m2} (ref >59)
GFR calc non Af Amer: 86 mL/min/{1.73_m2} (ref >59)
Glucose: 100 mg/dL — ABNORMAL HIGH (ref 65–99)
Potassium: 3.9 mmol/L (ref 3.5–5.2)
Sodium: 139 mmol/L (ref 134–144)

## 2019-02-16 NOTE — Patient Instructions (Signed)
Medication Instructions:  Your physician recommends that you continue on your current medications as directed. Please refer to the Current Medication list given to you today.  If you need a refill on your cardiac medications before your next appointment, please call your pharmacy.    Lab work: TODAY - cholesterol, liver panel, basic metabolic panel  If you have labs (blood work) drawn today and your tests are completely normal, you will receive your results only by: . MyChart Message (if you have MyChart) OR . A paper copy in the mail If you have any lab test that is abnormal or we need to change your treatment, we will call you to review the results.   Testing/Procedures: None Ordered   Follow-Up: At [facility], you and your health needs are our priority.  As part of our continuing mission to provide you with exceptional heart care, we have created designated Provider Care Teams.  These Care Teams include your primary Cardiologist (physician) and Advanced Practice Providers (APPs -  Physician Assistants and Nurse Practitioners) who all work together to provide you with the care you need, when you need it. You will need a follow up appointment in:  6 months.  Please call our office 2 months in advance to schedule this appointment.  You may see Philip Nahser, MD or one of the following Advanced Practice Providers on your designated Care Team: Scott Weaver, PA-C Vin Bhagat, PA-C . Janine Hammond, NP   

## 2019-02-16 NOTE — Progress Notes (Signed)
Cardiology Office Note:    Date:  02/16/2019   ID:  Ashley Dyer, DOB 05-24-1946, MRN [id number]  PCP:  Vivi Barrack, MD  Cardiologist:  Mertie Moores, MD , previouis Wynonia Lawman patient  Electrophysiologist:  None   Referring MD: Vivi Barrack, MD   Chief Complaint  Patient presents with  . Hypertension    February 16, 2019     Ashley Dyer is a 73 y.o. female with a hx of HTN, hyperlipidemia, asthma.  She is been followed by Dr. Wynonia Lawman in the past.  She was seen in January 06, 2027 by Beckie Busing, nurse practitioner for preoperative evaluation prior to shoulder surgery.  She has been intolerant to statin medications.  She is been on Praluent with good results. Still having leg cramps .   Is on a diuretic .  Recent left shoulder surgery on January 22, 2019 .  To start rehab in several weeks .   Exercises regularly   Past Medical History:  Diagnosis Date  . Allergy   . Anxiety    ED- visit for anxiety  . Arthritis    OA- shoulder(both) , hands  . Asthma   . Complication of anesthesia    fear- of feeling paralyzed - 1980's   . History of kidney stones 1980's   surgery  . Hyperlipidemia   . Hypertension   . 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 CATARACT EXTRACTION Bilateral   . KIDNEY STONE SURGERY    . TOTAL SHOULDER ARTHROPLASTY Right 06/13/2017  . TOTAL SHOULDER ARTHROPLASTY Right 06/13/2017   Procedure: TOTAL SHOULDER ARTHROPLASTY;  Surgeon: Tania Ade, MD;  Location: [place];  Service: Orthopedics;  Laterality: Right;  Right total shoulder arthroplasty  . TOTAL SHOULDER ARTHROPLASTY Left 01/22/2019   Procedure: TOTAL SHOULDER ARTHROPLASTY;  Surgeon: Tania Ade, MD;  Location: [facility];  Service: Orthopedics;  Laterality: Left;  Marland Kitchen VAGINAL DELIVERY  1976    Current Medications: Current Meds  Medication Sig  . albuterol (PROVENTIL HFA;VENTOLIN HFA) 108 (90 Base) MCG/ACT inhaler Inhale 1  puff into the lungs every 6 (six) hours as needed for wheezing or shortness of breath.  . Alirocumab (PRALUENT) 75 MG/ML SOAJ Inject 75 mg as directed every 14 (fourteen) days.  . Ascorbic Acid (VITAMIN C) 1000 MG tablet Take 1,000 mg by mouth daily.  Marland Kitchen CALCIUM CITRATE PO Take 1 capsule by mouth daily.  . Cholecalciferol (VITAMIN D3) 50 MCG (2000 UT) capsule Take 4,000 Units by mouth daily.   . cromolyn (NASALCROM) 5.2 MG/ACT nasal spray Place 1 spray into both nostrils daily.   . fluticasone (FLOVENT HFA) 110 MCG/ACT inhaler Inhale 1 puff into the lungs 2 (two) times daily as needed (wheezing or shortness of breath).   . triamterene-hydrochlorothiazide (MAXZIDE) 75-50 MG tablet Take 0.5-1 tablets by mouth See admin instructions. Take 1 tablet by mouth every other day alternating with 1/2 tablet the other days     Allergies:   Patient has no known allergies.   Social History   Socioeconomic History  . Marital status: Single    Spouse name: Not on file  . Number of children: Not on file  . Years of education: Not on file  . Highest education level: Not on file  Occupational History  . Not on file  Social Needs  . Financial resource strain: Not on file  . Food insecurity    Worry: Not on file  Inability: Not on file  . Transportation needs    Medical: Not on file    Non-medical: Not on file  Tobacco Use  . Smoking status: Former Smoker    Quit date: 1990    Years since quitting: 30.5  . Smokeless tobacco: Never Used  Substance and Sexual Activity  . Alcohol use: Yes    Alcohol/week: 8.0 - 10.0 standard drinks    Types: 8 - 10 Glasses of wine per week  . Drug use: No  . Sexual activity: Not on file  Lifestyle  . Physical activity    Days per week: Not on file    Minutes per session: Not on file  . Stress: Not on file  Relationships  . Social Herbalist on phone: Not on file    Gets together: Not on file    Attends religious service: Not on file    Active  member of club or organization: Not on file    Attends meetings of clubs or organizations: Not on file    Relationship status: Not on file  Other Topics Concern  . Not on file  Social History Narrative  . Not on file     Family History: The patient's family history includes Atrial fibrillation in her mother; Colon cancer in her paternal grandfather; Heart disease in her father; Leukemia in her brother. There is no history of Breast cancer.  ROS:   Please see the history of present illness.     All other systems reviewed and are negative.  EKGs/Labs/Other Studies Reviewed:    The following studies were reviewed today:   EKG:     Recent Labs: 01/19/2019: ALT 24 01/23/2019: BUN 20; Creatinine, Ser 0.85; Hemoglobin 11.5; Platelets 182; Potassium 4.0; Sodium 138  Recent Lipid Panel    Component Value Date/Time   CHOL 252 (A) 05/28/2017   TRIG 174 (A) 05/28/2017   HDL 72 (A) 05/28/2017   LDLCALC 145 05/28/2017    Physical Exam:    VS:  BP 140/78   Pulse 73   Ht 5\' 6"  (1.676 m)   Wt 160 lb 6.4 oz (72.8 kg)   SpO2 98%   BMI 25.89 kg/m     Wt Readings from Last 3 Encounters:  02/16/19 160 lb 6.4 oz (72.8 kg)  01/22/19 158 lb 6 oz (71.8 kg)  01/19/19 158 lb 6 oz (71.8 kg)     GEN:  Well nourished, well developed in no acute distress HEENT: Normal NECK: No JVD; No carotid bruits LYMPHATICS: No lymphadenopathy CARDIAC: RRR,  Soft systolic murmur  RESPIRATORY:  Clear to auscultation without rales, wheezing or rhonchi  ABDOMEN: Soft, non-tender, non-distended MUSCULOSKELETAL:  No edema; No deformity  SKIN: Warm and dry NEUROLOGIC:  Alert and oriented x 3 PSYCHIATRIC:  Normal affect   ASSESSMENT:    No diagnosis found. PLAN:    In order of problems listed above:  1. Hyperlipidemia: She is currently on Praluent.  She is not had labs drawn in quite some time.  We will draw fasting lipids, liver enzymes, basic metabolic profile today.  2.  Hypertension: She is  been on Maxide for many years.  Urgent blood pressures generally well controlled.  She is having some leg cramps.  I given her some advice on dealing with her leg cramps including diet tonic water, V8 juice, and stretching.  If her cramping is not better in 6 months then we may choose to discontinue the [facility] and start her  on other medications including an ARB and perhaps  low-dose HCTZ.   Medication Adjustments/Labs and Tests Ordered: Current medicines are reviewed at length with the patient today.  Concerns regarding medicines are outlined above.  No orders of the defined types were placed in this encounter.  No orders of the defined types were placed in this encounter.   There are no Patient Instructions on file for this visit.   Signed, Mertie Moores, MD  02/16/2019 9:41 AM    [place]

## 2019-04-03 ENCOUNTER — Telehealth: Payer: Self-pay | Admitting: *Deleted

## 2019-04-03 NOTE — Telephone Encounter (Signed)
   [facility] Pre-operative Risk Assessment    Request for surgical clearance:  1. What type of surgery is being performed?  LEFT HIP ARTHROPLASTY   2. When is this surgery scheduled?  TBD   3. What type of clearance is required (medical clearance vs. Pharmacy clearance to hold med vs. Both)?  MEDICAL  4. Are there any medications that need to be held prior to surgery and how long? N/A   5. Practice name and name of physician performing surgery?  [facility] / DR. Mayer Camel   6. What is your office phone number [phone]    7.   What is your office fax number [phone] ATTN: JESSICA  8.   Anesthesia type (None, local, MAC, general) ?  SPINAL ANESTHESIA   Jeanann Lewandowsky 04/03/2019, 3:41 PM  _________________________________________________________________   (provider comments below)

## 2019-04-06 NOTE — Telephone Encounter (Signed)
   Primary Cardiologist: Mertie Moores, MD  Chart reviewed as part of pre-operative protocol coverage. Given past medical history and time since last visit, based on ACC/AHA guidelines, Ashley Dyer would be at acceptable risk for the planned procedure without further cardiovascular testing.   I will route this recommendation to the requesting party via Epic fax function and remove from pre-op pool.  Please call with questions.  Lyda Jester, PA-C 04/06/2019, 9:20 AM

## 2019-04-09 ENCOUNTER — Other Ambulatory Visit: Payer: Self-pay | Admitting: Orthopedic Surgery

## 2019-04-14 ENCOUNTER — Encounter: Payer: Self-pay | Admitting: Family Medicine

## 2019-04-23 ENCOUNTER — Other Ambulatory Visit: Payer: Self-pay | Admitting: Family Medicine

## 2019-04-23 ENCOUNTER — Encounter: Payer: Self-pay | Admitting: Cardiovascular Disease

## 2019-04-23 DIAGNOSIS — Z1231 Encounter for screening mammogram for malignant neoplasm of breast: Secondary | ICD-10-CM

## 2019-04-23 MED ORDER — TRIAMTERENE-HCTZ 75-50 MG PO TABS: ORAL_TABLET | ORAL | 10 refills | Status: DC

## 2019-04-23 NOTE — Telephone Encounter (Signed)
Pt's medication was sent to pt's pharmacy as requested. Confirmation received.  °

## 2019-05-01 NOTE — Progress Notes (Signed)
PCP - Dimas Chyle Cardiologist - Clearance Ellen Henri PA-c 04-06-19 epic  Chest x-ray - 01-19-19 epic EKG - 01-06-19 epic Stress Test -  ECHO -  Cardiac Cath -   Sleep Study -  CPAP -   Fasting Blood Sugar -  Checks Blood Sugar _____ times a day  Blood Thinner Instructions: Aspirin Instructions: Last Dose:  Anesthesia review:   Patient denies shortness of breath, fever, cough and chest pain at PAT appointment   Patient verbalized understanding of instructions that were given to them at the PAT appointment. Patient was also instructed that they will need to review over the PAT instructions again at home before surgery.

## 2019-05-01 NOTE — Patient Instructions (Addendum)
DUE TO COVID-19 ONLY ONE VISITOR IS ALLOWED TO COME WITH YOU AND STAY IN THE WAITING ROOM ONLY DURING PRE OP AND PROCEDURE DAY OF SURGERY. THE 1 VISITOR MAY VISIT WITH YOU AFTER SURGERY IN YOUR PRIVATE ROOM DURING VISITING HOURS ONLY!  YOU NEED TO HAVE A COVID 19 TEST ON_______ @_______ , THIS TEST MUST BE DONE BEFORE SURGERY, COME  [place], [place] , [address].  ([place]) ONCE YOUR COVID TEST IS COMPLETED, PLEASE BEGIN THE QUARANTINE INSTRUCTIONS AS OUTLINED IN YOUR HANDOUT.                Ashley Dyer  05/01/2019   Your procedure is scheduled on: 05-11-19   Report to [facility] Main  Entrance   Report to admitting at      0930  AM     Call this number if you have problems the morning of surgery [phone]    Remember: NO SOLID FOOD AFTER MIDNIGHT THE NIGHT PRIOR TO SURGERY. NOTHING BY MOUTH EXCEPT CLEAR LIQUIDS UNTIL  0915  . PLEASE FINISH ENSURE DRINK PER SURGEON ORDER  WHICH NEEDS TO BE COMPLETED AT      0915 am then nothing by mouth.    CLEAR LIQUID DIET   Foods Allowed                                                                                      Foods Excluded  Coffee and tea, regular and decaf no creamer                                     liquids that you cannot  Plain Jell-O any favor except red or purple                                           see through such as: Fruit ices (not with fruit pulp)                                                              milk, soups, orange juice  Iced Popsicles                                                                All solid food Carbonated beverages, regular and diet                                    Cranberry, grape and apple juices Sports drinks like Gatorade  Lightly seasoned clear broth or consume(fat free) Sugar, honey syrup    BRUSH YOUR TEETH MORNING OF SURGERY AND RINSE YOUR MOUTH OUT, NO CHEWING GUM CANDY OR MINTS.     Take these medicines the morning of  surgery with A SIP OF WATER:  Nasal spray and tylenol if needed                                 You may not have any metal on your body including hair pins and              piercings  Do not wear jewelry, make-up, lotions, powders or perfumes, deodorant             Do not wear nail polish on your fingernails.  Do not shave  48 hours prior to surgery.        Do not bring valuables to the hospital. [employer].  Contacts, dentures or bridgework may not be worn into surgery.                 Please read over the following fact sheets you were given: _____________________________________________________________________             [facility] - Preparing for Surgery Before surgery, you can play an important role.  Because skin is not sterile, your skin needs to be as free of germs as possible.  You can reduce the number of germs on your skin by washing with CHG (chlorahexidine gluconate) soap before surgery.  CHG is an antiseptic cleaner which kills germs and bonds with the skin to continue killing germs even after washing. Please DO NOT use if you have an allergy to CHG or antibacterial soaps.  If your skin becomes reddened/irritated stop using the CHG and inform your nurse when you arrive at Short Stay. Do not shave (including legs and underarms) for at least 48 hours prior to the first CHG shower.  You may shave your face/neck. Please follow these instructions carefully:  1.  Shower with CHG Soap the night before surgery and the  morning of Surgery.  2.  If you choose to wash your hair, wash your hair first as usual with your  normal  shampoo.  3.  After you shampoo, rinse your hair and body thoroughly to remove the  shampoo.                           4.  Use CHG as you would any other liquid soap.  You can apply chg directly  to the skin and wash                       Gently with a scrungie or clean washcloth.  5.  Apply the CHG Soap to  your body ONLY FROM THE NECK DOWN.   Do not use on face/ open                           Wound or open sores. Avoid contact with eyes, ears mouth and genitals (private parts).                       Wash face,  Development worker, international aid (private parts)  with your normal soap.             6.  Wash thoroughly, paying special attention to the area where your surgery  will be performed.  7.  Thoroughly rinse your body with warm water from the neck down.  8.  DO NOT shower/wash with your normal soap after using and rinsing off  the CHG Soap.                9.  Pat yourself dry with a clean towel.            10.  Wear clean pajamas.            11.  Place clean sheets on your bed the night of your first shower and do not  sleep with pets. Day of Surgery : Do not apply any lotions/deodorants the morning of surgery.  Please wear clean clothes to the hospital/surgery center.  FAILURE TO FOLLOW THESE INSTRUCTIONS MAY RESULT IN THE CANCELLATION OF YOUR SURGERY PATIENT SIGNATURE_________________________________  NURSE SIGNATURE__________________________________  ________________________________________________________________________   Ashley Dyer  An incentive spirometer is a tool that can help keep your lungs clear and active. This tool measures how well you are filling your lungs with each breath. Taking long deep breaths may help reverse or decrease the chance of developing breathing (pulmonary) problems (especially infection) following:  A long period of time when you are unable to move or be active. BEFORE THE PROCEDURE   If the spirometer includes an indicator to show your best effort, your nurse or respiratory therapist will set it to a desired goal.  If possible, sit up straight or lean slightly forward. Try not to slouch.  Hold the incentive spirometer in an upright position. INSTRUCTIONS FOR USE  1. Sit on the edge of your bed if possible, or sit up as far as you can in bed or on a  chair. 2. Hold the incentive spirometer in an upright position. 3. Breathe out normally. 4. Place the mouthpiece in your mouth and seal your lips tightly around it. 5. Breathe in slowly and as deeply as possible, raising the piston or the ball toward the top of the column. 6. Hold your breath for 3-5 seconds or for as long as possible. Allow the piston or ball to fall to the bottom of the column. 7. Remove the mouthpiece from your mouth and breathe out normally. 8. Rest for a few seconds and repeat Steps 1 through 7 at least 10 times every 1-2 hours when you are awake. Take your time and take a few normal breaths between deep breaths. 9. The spirometer may include an indicator to show your best effort. Use the indicator as a goal to work toward during each repetition. 10. After each set of 10 deep breaths, practice coughing to be sure your lungs are clear. If you have an incision (the cut made at the time of surgery), support your incision when coughing by placing a pillow or rolled up towels firmly against it. Once you are able to get out of bed, walk around indoors and cough well. You may stop using the incentive spirometer when instructed by your caregiver.  RISKS AND COMPLICATIONS  Take your time so you do not get dizzy or light-headed.  If you are in pain, you may need to take or ask for pain medication before doing incentive spirometry. It is harder to take a deep breath if you are having pain. AFTER USE  Rest and breathe  slowly and easily.  It can be helpful to keep track of a log of your progress. Your caregiver can provide you with a simple table to help with this. If you are using the spirometer at home, follow these instructions: [place] IF:   You are having difficultly using the spirometer.  You have trouble using the spirometer as often as instructed.  Your pain medication is not giving enough relief while using the spirometer.  You develop fever of 100.5 F  (38.1 C) or higher. SEEK IMMEDIATE MEDICAL CARE IF:   You cough up bloody sputum that had not been present before.  You develop fever of 102 F (38.9 C) or greater.  You develop worsening pain at or near the incision site. MAKE SURE YOU:   Understand these instructions.  Will watch your condition.  Will get help right away if you are not doing well or get worse. Document Released: 12/03/2006 Document Revised: 10/15/2011 Document Reviewed: 02/03/2007 ExitCare Patient Information 2014 Exit[employer], [place].   ________________________________________________________________________  WHAT IS A BLOOD TRANSFUSION? Blood Transfusion Information  A transfusion is the replacement of blood or some of its parts. Blood is made up of multiple cells which provide different functions.  Red blood cells carry oxygen and are used for blood loss replacement.  White blood cells fight against infection.  Platelets control bleeding.  Plasma helps clot blood.  Other blood products are available for specialized needs, such as hemophilia or other clotting disorders. BEFORE THE TRANSFUSION  Who gives blood for transfusions?   Healthy volunteers who are fully evaluated to make sure their blood is safe. This is blood bank blood. Transfusion therapy is the safest it has ever been in the practice of medicine. Before blood is taken from a donor, a complete history is taken to make sure that person has no history of diseases nor engages in risky social behavior (examples are intravenous drug use or sexual activity with multiple partners). The donor's travel history is screened to minimize risk of transmitting infections, such as malaria. The donated blood is tested for signs of infectious diseases, such as HIV and hepatitis. The blood is then tested to be sure it is compatible with you in order to minimize the chance of a transfusion reaction. If you or a relative donates blood, this is often done in anticipation  of surgery and is not appropriate for emergency situations. It takes many days to process the donated blood. RISKS AND COMPLICATIONS Although transfusion therapy is very safe and saves many lives, the main dangers of transfusion include:   Getting an infectious disease.  Developing a transfusion reaction. This is an allergic reaction to something in the blood you were given. Every precaution is taken to prevent this. The decision to have a blood transfusion has been considered carefully by your caregiver before blood is given. Blood is not given unless the benefits outweigh the risks. AFTER THE TRANSFUSION  Right after receiving a blood transfusion, you will usually feel much better and more energetic. This is especially true if your red blood cells have gotten low (anemic). The transfusion raises the level of the red blood cells which carry oxygen, and this usually causes an energy increase.  The nurse administering the transfusion will monitor you carefully for complications. HOME CARE INSTRUCTIONS  No special instructions are needed after a transfusion. You may find your energy is better. Speak with your caregiver about any limitations on activity for underlying diseases you may have. [place]  IF:   Your condition is not improving after your transfusion.  You develop redness or irritation at the intravenous (IV) site. SEEK IMMEDIATE MEDICAL CARE IF:  Any of the following symptoms occur over the next 12 hours:  Shaking chills.  You have a temperature by mouth above 102 F (38.9 C), not controlled by medicine.  Chest, back, or muscle pain.  People around you feel you are not acting correctly or are confused.  Shortness of breath or difficulty breathing.  Dizziness and fainting.  You get a rash or develop hives.  You have a decrease in urine output.  Your urine turns a dark color or changes to pink, red, or brown. Any of the following symptoms occur over the next 10  days:  You have a temperature by mouth above 102 F (38.9 C), not controlled by medicine.  Shortness of breath.  Weakness after normal activity.  The white part of the eye turns yellow (jaundice).  You have a decrease in the amount of urine or are urinating less often.  Your urine turns a dark color or changes to pink, red, or brown. Document Released: 07/20/2000 Document Revised: 10/15/2011 Document Reviewed: 03/08/2008 [facility] Patient Information 2014 [place], [place].  _______________________________________________________________________

## 2019-05-04 ENCOUNTER — Encounter (HOSPITAL_COMMUNITY): Payer: Self-pay

## 2019-05-04 ENCOUNTER — Other Ambulatory Visit: Payer: Self-pay

## 2019-05-04 ENCOUNTER — Encounter (HOSPITAL_COMMUNITY)
Admission: RE | Admit: 2019-05-04 | Discharge: 2019-05-04 | Disposition: A | Discharge status: Home / Self Care | Payer: Medicare Other | Source: Ambulatory Visit | Attending: Orthopedic Surgery | Admitting: Orthopedic Surgery

## 2019-05-04 DIAGNOSIS — Z96611 Presence of right artificial shoulder joint: Secondary | ICD-10-CM | POA: Insufficient documentation

## 2019-05-04 DIAGNOSIS — Z87891 Personal history of nicotine dependence: Secondary | ICD-10-CM | POA: Insufficient documentation

## 2019-05-04 DIAGNOSIS — Z96612 Presence of left artificial shoulder joint: Secondary | ICD-10-CM | POA: Insufficient documentation

## 2019-05-04 DIAGNOSIS — I10 Essential (primary) hypertension: Secondary | ICD-10-CM | POA: Diagnosis not present

## 2019-05-04 DIAGNOSIS — Z01818 Encounter for other preprocedural examination: Secondary | ICD-10-CM

## 2019-05-04 DIAGNOSIS — Z01812 Encounter for preprocedural laboratory examination: Secondary | ICD-10-CM | POA: Diagnosis not present

## 2019-05-04 DIAGNOSIS — Z79899 Other long term (current) drug therapy: Secondary | ICD-10-CM | POA: Insufficient documentation

## 2019-05-04 DIAGNOSIS — J45909 Unspecified asthma, uncomplicated: Secondary | ICD-10-CM | POA: Insufficient documentation

## 2019-05-04 DIAGNOSIS — M1612 Unilateral primary osteoarthritis, left hip: Secondary | ICD-10-CM | POA: Diagnosis not present

## 2019-05-04 HISTORY — DX: Pneumonia, unspecified organism: J18.9

## 2019-05-04 LAB — CBC WITH DIFFERENTIAL/PLATELET
Abs Immature Granulocytes: 0.01 10*3/uL (ref 0.00–0.07)
Basophils Absolute: 0.1 10*3/uL (ref 0.0–0.1)
Basophils Relative: 1 %
Eosinophils Absolute: 0.1 10*3/uL (ref 0.0–0.5)
Eosinophils Relative: 2 %
HCT: 42.4 % (ref 36.0–46.0)
Hemoglobin: 13.9 g/dL (ref 12.0–15.0)
Immature Granulocytes: 0 %
Lymphocytes Relative: 27 %
Lymphs Abs: 1.5 10*3/uL (ref 0.7–4.0)
MCH: 31.2 pg (ref 26.0–34.0)
MCHC: 32.8 g/dL (ref 30.0–36.0)
MCV: 95.3 fL (ref 80.0–100.0)
Monocytes Absolute: 0.6 10*3/uL (ref 0.1–1.0)
Monocytes Relative: 11 %
Neutro Abs: 3.4 10*3/uL (ref 1.7–7.7)
Neutrophils Relative %: 59 %
Platelets: 248 10*3/uL (ref 150–400)
RBC: 4.45 MIL/uL (ref 3.87–5.11)
RDW: 13.1 % (ref 11.5–15.5)
WBC: 5.7 10*3/uL (ref 4.0–10.5)
nRBC: 0 % (ref 0.0–0.2)

## 2019-05-04 LAB — BASIC METABOLIC PANEL
Anion gap: 12 (ref 5–15)
BUN: 28 mg/dL — ABNORMAL HIGH (ref 8–23)
CO2: 23 mmol/L (ref 22–32)
Calcium: 9.3 mg/dL (ref 8.9–10.3)
Chloride: 105 mmol/L (ref 98–111)
Creatinine, Ser: 0.84 mg/dL (ref 0.44–1.00)
GFR calc Af Amer: >60 mL/min (ref >60)
GFR calc non Af Amer: >60 mL/min (ref >60)
Glucose, Bld: 115 mg/dL — ABNORMAL HIGH (ref 70–99)
Potassium: 4.1 mmol/L (ref 3.5–5.1)
Sodium: 140 mmol/L (ref 135–145)

## 2019-05-04 LAB — URINALYSIS, ROUTINE W REFLEX MICROSCOPIC
Bilirubin Urine: NEGATIVE
Glucose, UA: NEGATIVE mg/dL
Hgb urine dipstick: NEGATIVE
Ketones, ur: NEGATIVE mg/dL
Nitrite: NEGATIVE
Protein, ur: NEGATIVE mg/dL
Specific Gravity, Urine: 1.014 (ref 1.005–1.030)
pH: 7 (ref 5.0–8.0)

## 2019-05-04 LAB — PROTIME-INR
INR: 1 (ref 0.8–1.2)
Prothrombin Time: 12.7 seconds (ref 11.4–15.2)

## 2019-05-04 LAB — APTT: aPTT: 34 seconds (ref 24–36)

## 2019-05-04 LAB — SURGICAL PCR SCREEN
MRSA, PCR: NEGATIVE
Staphylococcus aureus: NEGATIVE

## 2019-05-04 LAB — ABO/RH: ABO/RH(D): O POS

## 2019-05-05 NOTE — Anesthesia Preprocedure Evaluation (Addendum)
Anesthesia Evaluation  Patient identified by MRN, date of birth, ID band Patient awake    Reviewed: Allergy & Precautions, NPO status , Patient's Chart, lab work & pertinent test results  Airway Mallampati: II  TM Distance: >3 FB Neck ROM: Full    Dental no notable dental hx.    Pulmonary neg pulmonary ROS, former smoker,    Pulmonary exam normal breath sounds clear to auscultation       Cardiovascular hypertension, Normal cardiovascular exam Rhythm:Regular Rate:Normal     Neuro/Psych negative neurological ROS  negative psych ROS   GI/Hepatic negative GI ROS, Neg liver ROS,   Endo/Other  negative endocrine ROS  Renal/GU negative Renal ROS  negative genitourinary   Musculoskeletal  (+) Arthritis ,   Abdominal   Peds negative pediatric ROS (+)  Hematology negative hematology ROS (+)   Anesthesia Other Findings   Reproductive/Obstetrics negative OB ROS                            Anesthesia Physical Anesthesia Plan  ASA: II  Anesthesia Plan: Spinal   Post-op Pain Management:    Induction: Intravenous  PONV Risk Score and Plan: 2 and Ondansetron, Dexamethasone and Treatment may vary due to age or medical condition  Airway Management Planned: Simple Face Mask  Additional Equipment:   Intra-op Plan:   Post-operative Plan:   Informed Consent: I have reviewed the patients History and Physical, chart, labs and discussed the procedure including the risks, benefits and alternatives for the proposed anesthesia with the patient or authorized representative who has indicated his/her understanding and acceptance.     Dental advisory given  Plan Discussed with: CRNA and Surgeon  Anesthesia Plan Comments: (See PAT note 05/04/2019, Konrad Felix, PA-C)       Anesthesia Quick Evaluation

## 2019-05-05 NOTE — Progress Notes (Signed)
Anesthesia Chart Review   Case: [id number] Date/Time: 05/11/19 1142   Procedure: LEFT TOTAL HIP ARTHROPLASTY ANTERIOR APPROACH (Left Hip)   Anesthesia type: Spinal   Pre-op diagnosis: LEFT HIP OSTEOARTHRITIS   Location: [facility]   Surgeon: Frederik Pear, MD      DISCUSSION:73 y.o. former smoker (quit 08/06/88) with h/o HTN, asthma, HLD, anxiety, left hip OA scheduled for above procedure 05/11/2019 with Dr. Frederik Pear.   Pt reports experienced fear of feeling paralyzed with anesthesia in the past  Pt cleared by cardiology 04/06/2019.  Per Lyda Jester, PA-C, "Given past medical history and time since last visit, based on ACC/AHA guidelines, OLUWATOMI SCHEFFLER would be at acceptable risk for the planned procedure without further cardiovascular testing."  S/p left shoulder surgery 01/22/2019 with no anesthesia complications noted.   Anticipate pt can proceed with planned procedure barring acute status change.   VS: BP (!) 135/58   Pulse 76   Temp 37.2 C (Oral)   Resp 18   Ht 5\' 7"  (1.702 m)   Wt 72.2 kg   SpO2 99%   BMI 24.94 kg/m   PROVIDERS: Vivi Barrack, MD is PCP   Mertie Moores, MD is Cardiologist  LABS: Labs reviewed: Acceptable for surgery. (all labs ordered are listed, but only abnormal results are displayed)  Labs Reviewed  BASIC METABOLIC PANEL - Abnormal; Notable for the following components:      Result Value   Glucose, Bld 115 (*)    BUN 28 (*)    All other components within normal limits  URINALYSIS, ROUTINE W REFLEX MICROSCOPIC - Abnormal; Notable for the following components:   APPearance HAZY (*)    Leukocytes,Ua TRACE (*)    Bacteria, UA RARE (*)    All other components within normal limits  SURGICAL PCR SCREEN  APTT  CBC WITH DIFFERENTIAL/PLATELET  PROTIME-INR  TYPE AND SCREEN  ABO/RH     IMAGES: Chest Xray 01/19/2019 FINDINGS: The heart size and mediastinal contours are stable. There are scattered right lung granulomas which  are stable. The lungs are otherwise clear. There is no pleural effusion or pneumothorax. No acute osseous findings are seen status post interval right shoulder arthroplasty.  IMPRESSION: Stable chest without evidence of active cardiopulmonary process.  EKG: 01/06/2019 Rate 73 bpm Normal sinus rhythm  Possible anterior infarct.   CV:  Past Medical History:  Diagnosis Date  . Allergy   . Anxiety    ED- visit for anxiety  . Arthritis    OA- shoulder(both) , hands  . Asthma   . Complication of anesthesia    fear- of feeling paralyzed - 1980's   . History of kidney stones 1980's   surgery  . Hyperlipidemia   . Hypertension   . 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 Walking pneumonia     Past Surgical History:  Procedure Laterality Date  . CATARACT EXTRACTION Bilateral   . KIDNEY STONE SURGERY    . TOTAL SHOULDER ARTHROPLASTY Right 06/13/2017  . TOTAL SHOULDER ARTHROPLASTY Right 06/13/2017   Procedure: TOTAL SHOULDER ARTHROPLASTY;  Surgeon: Tania Ade, MD;  Location: [facility];  Service: Orthopedics;  Laterality: Right;  Right total shoulder arthroplasty  . TOTAL SHOULDER ARTHROPLASTY Left 01/22/2019   Procedure: TOTAL SHOULDER ARTHROPLASTY;  Surgeon: Tania Ade, MD;  Location[facility];  Service: Orthopedics;  Laterality: Left;  Marland Kitchen VAGINAL DELIVERY  1976    MEDICATIONS: . acetaminophen (TYLENOL) 500 MG tablet  . Alirocumab (PRALUENT) 75 MG/ML  SOAJ  . Ascorbic Acid (VITAMIN C) 1000 MG tablet  . CALCIUM CITRATE PO  . cholecalciferol (VITAMIN D) 25 MCG (1000 UT) tablet  . cromolyn (NASALCROM) 5.2 MG/ACT nasal spray  . triamterene-hydrochlorothiazide (MAXZIDE) 75-50 MG tablet   No current facility-administered medications for this encounter.      Maia Plan [facility] Pre-Surgical Testing [phone] 05/05/19  2:14 PM

## 2019-05-07 ENCOUNTER — Other Ambulatory Visit (HOSPITAL_COMMUNITY)
Admission: RE | Admit: 2019-05-07 | Discharge: 2019-05-07 | Disposition: A | Discharge status: Home / Self Care | Payer: Medicare Other | Source: Ambulatory Visit | Attending: Orthopedic Surgery | Admitting: Orthopedic Surgery

## 2019-05-07 DIAGNOSIS — Z01812 Encounter for preprocedural laboratory examination: Secondary | ICD-10-CM | POA: Diagnosis not present

## 2019-05-07 DIAGNOSIS — Z20828 Contact with and (suspected) exposure to other viral communicable diseases: Secondary | ICD-10-CM | POA: Insufficient documentation

## 2019-05-08 DIAGNOSIS — M1612 Unilateral primary osteoarthritis, left hip: Secondary | ICD-10-CM | POA: Diagnosis present

## 2019-05-08 LAB — NOVEL CORONAVIRUS, NAA (HOSP ORDER, SEND-OUT TO REF LAB; TAT 18-24 HRS): SARS-CoV-2, NAA: NOT DETECTED

## 2019-05-08 NOTE — H&P (Signed)
TOTAL HIP ADMISSION H&P  Patient is admitted for left total hip arthroplasty.  Subjective:  Chief Complaint: left hip pain  HPI: Ashley Dyer, 73 y.o. female, has a history of pain and functional disability in the left hip(s) due to arthritis and patient has failed non-surgical conservative treatments for greater than 12 weeks to include NSAID's and/or analgesics, use of assistive devices and activity modification.  Onset of symptoms was gradual starting 4 years ago with gradually worsening course since that time.The patient noted no past surgery on the left hip(s).  Patient currently rates pain in the left hip at 10 out of 10 with activity. Patient has night pain, worsening of pain with activity and weight bearing, trendelenberg gait, pain that interfers with activities of daily living and pain with passive range of motion. Patient has evidence of subchondral cysts and joint space narrowing by imaging studies. This condition presents safety issues increasing the risk of falls.   There is no current active infection.  Patient Active Problem List   Diagnosis Date Noted  . 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 Essential hypertension 09/08/2018  . Dyslipidemia 09/08/2018  . Allergic rhinitis 09/08/2018  . Mild intermittent asthma without complication [id number]  . 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   Past Medical History:  Diagnosis Date  . Allergy   . Anxiety    ED- visit for anxiety  . Arthritis    OA- shoulder(both) , hands  . Asthma   . Complication of anesthesia    fear- of feeling paralyzed - 1980's   . History of kidney stones 1980's   surgery  . Hyperlipidemia   . Hypertension   . 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 Walking pneumonia     Past Surgical History:  Procedure Laterality Date  . CATARACT EXTRACTION Bilateral   . KIDNEY STONE SURGERY    . TOTAL SHOULDER ARTHROPLASTY Right 06/13/2017  . TOTAL SHOULDER  ARTHROPLASTY Right 06/13/2017   Procedure: TOTAL SHOULDER ARTHROPLASTY;  Surgeon: Tania Ade, MD;  Location: [place];  Service: Orthopedics;  Laterality: Right;  Right total shoulder arthroplasty  . TOTAL SHOULDER ARTHROPLASTY Left 01/22/2019   Procedure: TOTAL SHOULDER ARTHROPLASTY;  Surgeon: Tania Ade, MD;  Location[facility];  Service: Orthopedics;  Laterality: Left;  Marland Kitchen VAGINAL DELIVERY  1976    No current facility-administered medications for this encounter.    Current Outpatient Medications  Medication Sig Dispense Refill Last Dose  . acetaminophen (TYLENOL) 500 MG tablet Take 1,000 mg by mouth 2 (two) times daily.     . Alirocumab (PRALUENT) 75 MG/ML SOAJ Inject 75 mg as directed every 14 (fourteen) days. 2 mL 11   . Ascorbic Acid (VITAMIN C) 1000 MG tablet Take 1,000 mg by mouth daily.     Marland Kitchen CALCIUM CITRATE PO Take 1 tablet by mouth daily.      . cholecalciferol (VITAMIN D) 25 MCG (1000 UT) tablet Take 1,000 Units by mouth 2 (two) times daily.     . cromolyn (NASALCROM) 5.2 MG/ACT nasal spray Place 1 spray into both nostrils 2 (two) times daily.      Marland Kitchen triamterene-hydrochlorothiazide (MAXZIDE) 75-50 MG tablet Take 1 tablet by mouth every other day alternating with 1/2 tablet the other days (Patient taking differently: Take 0.5-1 tablets by mouth See admin instructions. Take 1 tablet by mouth every other day alternating with 1/2 tablet the other days) 30 tablet 10    No Known Allergies  Social History   Tobacco  Use  . Smoking status: Former Smoker    Quit date: 1990    Years since quitting: 30.7  . Smokeless tobacco: Never Used  Substance Use Topics  . Alcohol use: Yes    Alcohol/week: 8.0 - 10.0 standard drinks    Types: 8 - 10 Glasses of wine per week    Family History  Problem Relation Age of Onset  . Atrial fibrillation Mother   . Heart disease Father   . Colon cancer Paternal Grandfather   . Leukemia Brother   . Breast cancer Neg Hx      Review of Systems   Constitutional: Negative.   HENT: Negative.   Eyes: Negative.   Respiratory: Negative.   Cardiovascular:       HTN  Gastrointestinal: Negative.   Genitourinary: Negative.   Musculoskeletal: Positive for joint pain and myalgias.  Skin: Negative.   Neurological: Negative.   Endo/Heme/Allergies: Negative.   Psychiatric/Behavioral: Negative.     Objective:  Physical Exam  Constitutional: She appears well-developed and well-nourished.  HENT:  Head: Normocephalic and atraumatic.  Eyes: Pupils are equal, round, and reactive to light.  Neck: Normal range of motion. Neck supple.  Cardiovascular: Intact distal pulses.  Respiratory: Effort normal.  Musculoskeletal:        General: Tenderness present.     Comments: Patient walks with a left-sided limp left hip has significant discomfort with internal rotation past 5 foot tap is negative external rotation is to 40 good power to testing of the flexors abductors and abductors extensors of the left hip.  Neurovascular intact distally.  Skin: Skin is warm and dry.  Psychiatric: She has a normal mood and affect. Her behavior is normal. Judgment and thought content normal.    Vital signs in last 24 hours:    Labs:   Estimated body mass index is 24.94 kg/m as calculated from the following:   Height as of 05/04/19: 5\' 7"  (1.702 m).   Weight as of 05/04/19: 72.2 kg.   Imaging Review Plain radiographs demonstrate AP pelvis and crosstable lateral shows end-stage arthritis of the left hip bone-on-bone with subchondral cysts the right hip has a normal appearance.   Assessment/Plan:  End stage arthritis, left hip(s)  The patient history, physical examination, clinical judgement of the provider and imaging studies are consistent with end stage degenerative joint disease of the left hip(s) and total hip arthroplasty is deemed medically necessary. The treatment options including medical management, injection therapy, arthroscopy and  arthroplasty were discussed at length. The risks and benefits of total hip arthroplasty were presented and reviewed. The risks due to aseptic loosening, infection, stiffness, dislocation/subluxation,  thromboembolic complications and other imponderables were discussed.  The patient acknowledged the explanation, agreed to proceed with the plan and consent was signed. Patient is being admitted for inpatient treatment for surgery, pain control, PT, OT, prophylactic antibiotics, VTE prophylaxis, progressive ambulation and ADL's and discharge planning.The patient is planning to be discharged home with home health services    Patient's anticipated LOS is less than 2 midnights, meeting these requirements: - Younger than 64 - Lives within 1 hour of care - Has a competent adult at home to recover with post-op recover - NO history of  - Chronic pain requiring opiods  - Diabetes  - Coronary Artery Disease  - Heart failure  - Heart attack  - Stroke  - DVT/VTE  - Cardiac arrhythmia  - Respiratory Failure/COPD  - Renal failure  - Anemia  - Advanced Liver disease

## 2019-05-10 MED ORDER — BUPIVACAINE LIPOSOME 1.3 % IJ SUSP
10.0000 mL | INTRAMUSCULAR | Status: DC
  Filled 2019-05-10: qty 10

## 2019-05-10 MED ORDER — TRANEXAMIC ACID 1000 MG/10ML IV SOLN
2000.0000 mg | INTRAVENOUS | Status: DC
  Filled 2019-05-10: qty 20

## 2019-05-11 ENCOUNTER — Ambulatory Visit (HOSPITAL_COMMUNITY): Payer: Medicare Other

## 2019-05-11 ENCOUNTER — Observation Stay (HOSPITAL_COMMUNITY)
Admission: RE | Admit: 2019-05-11 | Discharge: 2019-05-12 | Disposition: A | Discharge status: Home / Self Care | Payer: Medicare Other | Attending: Orthopedic Surgery | Admitting: Orthopedic Surgery

## 2019-05-11 ENCOUNTER — Other Ambulatory Visit: Payer: Self-pay

## 2019-05-11 ENCOUNTER — Encounter (HOSPITAL_COMMUNITY): Payer: Self-pay | Admitting: *Deleted

## 2019-05-11 ENCOUNTER — Ambulatory Visit (HOSPITAL_COMMUNITY): Payer: Medicare Other | Admitting: Anesthesiology

## 2019-05-11 ENCOUNTER — Ambulatory Visit (HOSPITAL_COMMUNITY): Payer: Medicare Other | Admitting: Physician Assistant

## 2019-05-11 ENCOUNTER — Encounter (HOSPITAL_COMMUNITY)
Admission: RE | Disposition: A | Discharge status: Home / Self Care | Payer: Self-pay | Source: Home / Self Care | Attending: Orthopedic Surgery

## 2019-05-11 DIAGNOSIS — E785 Hyperlipidemia, unspecified: Secondary | ICD-10-CM | POA: Insufficient documentation

## 2019-05-11 DIAGNOSIS — I10 Essential (primary) hypertension: Secondary | ICD-10-CM | POA: Diagnosis not present

## 2019-05-11 DIAGNOSIS — M1612 Unilateral primary osteoarthritis, left hip: Principal | ICD-10-CM | POA: Insufficient documentation

## 2019-05-11 DIAGNOSIS — Z7982 Long term (current) use of aspirin: Secondary | ICD-10-CM | POA: Insufficient documentation

## 2019-05-11 DIAGNOSIS — Z96642 Presence of left artificial hip joint: Secondary | ICD-10-CM | POA: Diagnosis not present

## 2019-05-11 DIAGNOSIS — Z79899 Other long term (current) drug therapy: Secondary | ICD-10-CM | POA: Diagnosis not present

## 2019-05-11 DIAGNOSIS — R262 Difficulty in walking, not elsewhere classified: Secondary | ICD-10-CM | POA: Insufficient documentation

## 2019-05-11 DIAGNOSIS — Z471 Aftercare following joint replacement surgery: Secondary | ICD-10-CM | POA: Diagnosis not present

## 2019-05-11 DIAGNOSIS — Z419 Encounter for procedure for purposes other than remedying health state, unspecified: Secondary | ICD-10-CM

## 2019-05-11 DIAGNOSIS — M1990 Unspecified osteoarthritis, unspecified site: Secondary | ICD-10-CM | POA: Insufficient documentation

## 2019-05-11 DIAGNOSIS — F419 Anxiety disorder, unspecified: Secondary | ICD-10-CM | POA: Insufficient documentation

## 2019-05-11 DIAGNOSIS — J4520 Mild intermittent asthma, uncomplicated: Secondary | ICD-10-CM | POA: Insufficient documentation

## 2019-05-11 DIAGNOSIS — Z87891 Personal history of nicotine dependence: Secondary | ICD-10-CM | POA: Diagnosis not present

## 2019-05-11 HISTORY — PX: TOTAL HIP ARTHROPLASTY: SHX124

## 2019-05-11 LAB — TYPE AND SCREEN
ABO/RH(D): O POS
Antibody Screen: NEGATIVE

## 2019-05-11 SURGERY — ARTHROPLASTY, HIP, TOTAL, ANTERIOR APPROACH
Anesthesia: Spinal | Site: Hip | Laterality: Left

## 2019-05-11 MED ORDER — VITAMIN C 500 MG PO TABS
1000.0000 mg | ORAL_TABLET | Freq: Every day | ORAL | Status: DC
  Administered 2019-05-12: 08:00:00 1000 mg via ORAL
  Filled 2019-05-11: qty 2

## 2019-05-11 MED ORDER — EPHEDRINE 5 MG/ML INJ
INTRAVENOUS | Status: AC
  Filled 2019-05-11: qty 10

## 2019-05-11 MED ORDER — FENTANYL CITRATE (PF) 100 MCG/2ML IJ SOLN
INTRAMUSCULAR | Status: DC | PRN
  Administered 2019-05-11: 50 ug via INTRAVENOUS
  Administered 2019-05-11: 100 ug via INTRAVENOUS
  Administered 2019-05-11: 50 ug via INTRAVENOUS

## 2019-05-11 MED ORDER — ASPIRIN 81 MG PO CHEW
81.0000 mg | CHEWABLE_TABLET | Freq: Two times a day (BID) | ORAL | Status: DC
  Administered 2019-05-11 – 2019-05-12 (×2): 81 mg via ORAL
  Filled 2019-05-11 (×2): qty 1

## 2019-05-11 MED ORDER — CROMOLYN SODIUM 5.2 MG/ACT NA AERS
1.0000 | INHALATION_SPRAY | Freq: Two times a day (BID) | NASAL | Status: DC
  Filled 2019-05-11: qty 26

## 2019-05-11 MED ORDER — SODIUM CHLORIDE 0.9% FLUSH
INTRAVENOUS | Status: DC | PRN
  Administered 2019-05-11: 50 mL

## 2019-05-11 MED ORDER — METHOCARBAMOL 500 MG PO TABS
500.0000 mg | ORAL_TABLET | Freq: Four times a day (QID) | ORAL | Status: DC | PRN
  Administered 2019-05-12 (×2): 500 mg via ORAL
  Filled 2019-05-11 (×2): qty 1

## 2019-05-11 MED ORDER — TRANEXAMIC ACID-NACL 1000-0.7 MG/100ML-% IV SOLN
1000.0000 mg | INTRAVENOUS | Status: AC
  Administered 2019-05-11: 1000 mg via INTRAVENOUS
  Filled 2019-05-11: qty 100

## 2019-05-11 MED ORDER — ACETAMINOPHEN 325 MG PO TABS
325.0000 mg | ORAL_TABLET | Freq: Four times a day (QID) | ORAL | Status: DC | PRN
  Administered 2019-05-12: 650 mg via ORAL
  Filled 2019-05-11: qty 2

## 2019-05-11 MED ORDER — OXYCODONE-ACETAMINOPHEN 5-325 MG PO TABS: 1.0000 | ORAL_TABLET | ORAL | 0 refills | Status: DC | PRN

## 2019-05-11 MED ORDER — METHOCARBAMOL 500 MG IVPB - SIMPLE MED
INTRAVENOUS | Status: AC
  Administered 2019-05-11: 14:00:00 500 mg via INTRAVENOUS
  Filled 2019-05-11: qty 50

## 2019-05-11 MED ORDER — HYDROMORPHONE HCL 1 MG/ML IJ SOLN
INTRAMUSCULAR | Status: AC
  Administered 2019-05-11: 0.5 mg via INTRAVENOUS
  Filled 2019-05-11: qty 1

## 2019-05-11 MED ORDER — 0.9 % SODIUM CHLORIDE (POUR BTL) OPTIME
TOPICAL | Status: DC | PRN
  Administered 2019-05-11: 1000 mL

## 2019-05-11 MED ORDER — DOCUSATE SODIUM 100 MG PO CAPS
100.0000 mg | ORAL_CAPSULE | Freq: Two times a day (BID) | ORAL | Status: DC
  Administered 2019-05-11 – 2019-05-12 (×2): 100 mg via ORAL
  Filled 2019-05-11 (×2): qty 1

## 2019-05-11 MED ORDER — PHENYLEPHRINE 40 MCG/ML (10ML) SYRINGE FOR IV PUSH (FOR BLOOD PRESSURE SUPPORT)
PREFILLED_SYRINGE | INTRAVENOUS | Status: DC | PRN
  Administered 2019-05-11: 80 ug via INTRAVENOUS

## 2019-05-11 MED ORDER — SODIUM CHLORIDE 0.9 % IV SOLN
INTRAVENOUS | Status: DC | PRN
  Administered 2019-05-11: 50 ug/min via INTRAVENOUS

## 2019-05-11 MED ORDER — ALUMINUM HYDROXIDE GEL 320 MG/5ML PO SUSP
15.0000 mL | ORAL | Status: DC | PRN
  Filled 2019-05-11: qty 30

## 2019-05-11 MED ORDER — PHENOL 1.4 % MT LIQD
1.0000 | OROMUCOSAL | Status: DC | PRN
  Filled 2019-05-11: qty 177

## 2019-05-11 MED ORDER — ONDANSETRON HCL 4 MG/2ML IJ SOLN
INTRAMUSCULAR | Status: AC
  Filled 2019-05-11: qty 2

## 2019-05-11 MED ORDER — BUPIVACAINE HCL (PF) 0.25 % IJ SOLN
INTRAMUSCULAR | Status: DC | PRN
  Administered 2019-05-11: 30 mL

## 2019-05-11 MED ORDER — CEFAZOLIN SODIUM-DEXTROSE 2-4 GM/100ML-% IV SOLN
2.0000 g | INTRAVENOUS | Status: AC
  Administered 2019-05-11: 2 g via INTRAVENOUS
  Filled 2019-05-11: qty 100

## 2019-05-11 MED ORDER — ONDANSETRON HCL 4 MG/2ML IJ SOLN: 4.0000 mg | Freq: Four times a day (QID) | INTRAMUSCULAR | Status: DC | PRN

## 2019-05-11 MED ORDER — PROPOFOL 500 MG/50ML IV EMUL
INTRAVENOUS | Status: DC | PRN
  Administered 2019-05-11: 75 ug/kg/min via INTRAVENOUS

## 2019-05-11 MED ORDER — DIPHENHYDRAMINE HCL 12.5 MG/5ML PO ELIX: 12.5000 mg | ORAL_SOLUTION | ORAL | Status: DC | PRN

## 2019-05-11 MED ORDER — PHENYLEPHRINE HCL-NACL 10-0.9 MG/250ML-% IV SOLN
INTRAVENOUS | Status: AC
  Filled 2019-05-11: qty 250

## 2019-05-11 MED ORDER — TRIAMTERENE-HCTZ 75-50 MG PO TABS: 0.5000 | ORAL_TABLET | ORAL | Status: DC

## 2019-05-11 MED ORDER — TIZANIDINE HCL 2 MG PO TABS: 2.0000 mg | ORAL_TABLET | Freq: Four times a day (QID) | ORAL | 0 refills | Status: DC | PRN

## 2019-05-11 MED ORDER — CELECOXIB 200 MG PO CAPS
200.0000 mg | ORAL_CAPSULE | Freq: Two times a day (BID) | ORAL | Status: DC
  Administered 2019-05-11 – 2019-05-12 (×2): 200 mg via ORAL
  Filled 2019-05-11 (×2): qty 1

## 2019-05-11 MED ORDER — EPHEDRINE SULFATE-NACL 50-0.9 MG/10ML-% IV SOSY
PREFILLED_SYRINGE | INTRAVENOUS | Status: DC | PRN
  Administered 2019-05-11: 10 mg via INTRAVENOUS

## 2019-05-11 MED ORDER — STERILE WATER FOR IRRIGATION IR SOLN
Status: DC | PRN
  Administered 2019-05-11: 2000 mL

## 2019-05-11 MED ORDER — TRIAMTERENE-HCTZ 75-50 MG PO TABS
1.0000 | ORAL_TABLET | ORAL | Status: DC
  Filled 2019-05-11: qty 1

## 2019-05-11 MED ORDER — DEXAMETHASONE SODIUM PHOSPHATE 10 MG/ML IJ SOLN
10.0000 mg | Freq: Once | INTRAMUSCULAR | Status: DC
  Filled 2019-05-11: qty 1

## 2019-05-11 MED ORDER — HYDROMORPHONE HCL 1 MG/ML IJ SOLN
0.2500 mg | INTRAMUSCULAR | Status: DC | PRN
  Administered 2019-05-11: 14:00:00 0.5 mg via INTRAVENOUS

## 2019-05-11 MED ORDER — ONDANSETRON HCL 4 MG/2ML IJ SOLN
INTRAMUSCULAR | Status: DC | PRN
  Administered 2019-05-11: 4 mg via INTRAVENOUS

## 2019-05-11 MED ORDER — FENTANYL CITRATE (PF) 100 MCG/2ML IJ SOLN
INTRAMUSCULAR | Status: AC
  Filled 2019-05-11: qty 2

## 2019-05-11 MED ORDER — PANTOPRAZOLE SODIUM 40 MG PO TBEC
40.0000 mg | DELAYED_RELEASE_TABLET | Freq: Every day | ORAL | Status: DC
  Administered 2019-05-11 – 2019-05-12 (×2): 40 mg via ORAL
  Filled 2019-05-11 (×2): qty 1

## 2019-05-11 MED ORDER — GABAPENTIN 300 MG PO CAPS
300.0000 mg | ORAL_CAPSULE | Freq: Three times a day (TID) | ORAL | Status: DC
  Administered 2019-05-11 – 2019-05-12 (×3): 300 mg via ORAL
  Filled 2019-05-11 (×3): qty 1

## 2019-05-11 MED ORDER — ACETAMINOPHEN 500 MG PO TABS
1000.0000 mg | ORAL_TABLET | Freq: Four times a day (QID) | ORAL | Status: AC
  Administered 2019-05-11 – 2019-05-12 (×4): 1000 mg via ORAL
  Filled 2019-05-11 (×3): qty 2

## 2019-05-11 MED ORDER — ALIROCUMAB 75 MG/ML ~~LOC~~ SOAJ: 75.0000 mg | SUBCUTANEOUS | Status: DC

## 2019-05-11 MED ORDER — CHLORHEXIDINE GLUCONATE 4 % EX LIQD: 60.0000 mL | Freq: Once | CUTANEOUS | Status: DC

## 2019-05-11 MED ORDER — METOCLOPRAMIDE HCL 5 MG PO TABS: 5.0000 mg | ORAL_TABLET | Freq: Three times a day (TID) | ORAL | Status: DC | PRN

## 2019-05-11 MED ORDER — METOCLOPRAMIDE HCL 5 MG/ML IJ SOLN: 5.0000 mg | Freq: Three times a day (TID) | INTRAMUSCULAR | Status: DC | PRN

## 2019-05-11 MED ORDER — BUPIVACAINE IN DEXTROSE 0.75-8.25 % IT SOLN
INTRATHECAL | Status: DC | PRN
  Administered 2019-05-11: 2 mL via INTRATHECAL

## 2019-05-11 MED ORDER — ASPIRIN EC 81 MG PO TBEC: 81.0000 mg | DELAYED_RELEASE_TABLET | Freq: Two times a day (BID) | ORAL | 0 refills | Status: DC

## 2019-05-11 MED ORDER — METHOCARBAMOL 500 MG IVPB - SIMPLE MED
500.0000 mg | Freq: Four times a day (QID) | INTRAVENOUS | Status: DC | PRN
  Administered 2019-05-11: 14:00:00 500 mg via INTRAVENOUS
  Filled 2019-05-11: qty 50

## 2019-05-11 MED ORDER — DEXAMETHASONE SODIUM PHOSPHATE 10 MG/ML IJ SOLN
INTRAMUSCULAR | Status: AC
  Filled 2019-05-11: qty 1

## 2019-05-11 MED ORDER — ACETAMINOPHEN 500 MG PO TABS
ORAL_TABLET | ORAL | Status: AC
  Filled 2019-05-11: qty 2

## 2019-05-11 MED ORDER — MIDAZOLAM HCL 2 MG/2ML IJ SOLN
INTRAMUSCULAR | Status: AC
  Filled 2019-05-11: qty 2

## 2019-05-11 MED ORDER — POLYETHYLENE GLYCOL 3350 17 G PO PACK: 17.0000 g | PACK | Freq: Every day | ORAL | Status: DC | PRN

## 2019-05-11 MED ORDER — VITAMIN D3 25 MCG (1000 UNIT) PO TABS
1000.0000 [IU] | ORAL_TABLET | Freq: Two times a day (BID) | ORAL | Status: DC
  Administered 2019-05-11 – 2019-05-12 (×2): 1000 [IU] via ORAL
  Filled 2019-05-11 (×5): qty 1

## 2019-05-11 MED ORDER — TRANEXAMIC ACID 1000 MG/10ML IV SOLN
INTRAVENOUS | Status: DC | PRN
  Administered 2019-05-11: 11:00:00 2000 mg via TOPICAL

## 2019-05-11 MED ORDER — DEXAMETHASONE SODIUM PHOSPHATE 10 MG/ML IJ SOLN
INTRAMUSCULAR | Status: DC | PRN
  Administered 2019-05-11: 10 mg via INTRAVENOUS

## 2019-05-11 MED ORDER — PROMETHAZINE HCL 25 MG/ML IJ SOLN: 6.2500 mg | INTRAMUSCULAR | Status: DC | PRN

## 2019-05-11 MED ORDER — FLEET ENEMA 7-19 GM/118ML RE ENEM: 1.0000 | ENEMA | Freq: Once | RECTAL | Status: DC | PRN

## 2019-05-11 MED ORDER — MENTHOL 3 MG MT LOZG: 1.0000 | LOZENGE | OROMUCOSAL | Status: DC | PRN

## 2019-05-11 MED ORDER — POVIDONE-IODINE 10 % EX SWAB
2.0000 "application " | Freq: Once | CUTANEOUS | Status: AC
  Administered 2019-05-11: 2 via TOPICAL

## 2019-05-11 MED ORDER — ONDANSETRON HCL 4 MG PO TABS: 4.0000 mg | ORAL_TABLET | Freq: Four times a day (QID) | ORAL | Status: DC | PRN

## 2019-05-11 MED ORDER — FLUTICASONE PROPIONATE 50 MCG/ACT NA SUSP
1.0000 | Freq: Every day | NASAL | Status: DC
  Filled 2019-05-11: qty 16

## 2019-05-11 MED ORDER — BISACODYL 5 MG PO TBEC: 5.0000 mg | DELAYED_RELEASE_TABLET | Freq: Every day | ORAL | Status: DC | PRN

## 2019-05-11 MED ORDER — OXYCODONE HCL 5 MG PO TABS
5.0000 mg | ORAL_TABLET | ORAL | Status: DC | PRN
  Filled 2019-05-11 (×2): qty 2

## 2019-05-11 MED ORDER — PROPOFOL 500 MG/50ML IV EMUL
INTRAVENOUS | Status: AC
  Filled 2019-05-11: qty 50

## 2019-05-11 MED ORDER — PHENYLEPHRINE 40 MCG/ML (10ML) SYRINGE FOR IV PUSH (FOR BLOOD PRESSURE SUPPORT)
PREFILLED_SYRINGE | INTRAVENOUS | Status: AC
  Filled 2019-05-11: qty 10

## 2019-05-11 MED ORDER — BUPIVACAINE LIPOSOME 1.3 % IJ SUSP
INTRAMUSCULAR | Status: DC | PRN
  Administered 2019-05-11: 10 mL

## 2019-05-11 MED ORDER — TRANEXAMIC ACID-NACL 1000-0.7 MG/100ML-% IV SOLN
1000.0000 mg | Freq: Once | INTRAVENOUS | Status: AC
  Administered 2019-05-11: 1000 mg via INTRAVENOUS
  Filled 2019-05-11: qty 100

## 2019-05-11 MED ORDER — BUPIVACAINE HCL (PF) 0.25 % IJ SOLN
INTRAMUSCULAR | Status: AC
  Filled 2019-05-11: qty 30

## 2019-05-11 MED ORDER — MIDAZOLAM HCL 5 MG/5ML IJ SOLN
INTRAMUSCULAR | Status: DC | PRN
  Administered 2019-05-11: 2 mg via INTRAVENOUS

## 2019-05-11 MED ORDER — PROPOFOL 10 MG/ML IV BOLUS
INTRAVENOUS | Status: DC | PRN
  Administered 2019-05-11: 20 mg via INTRAVENOUS

## 2019-05-11 MED ORDER — KCL IN DEXTROSE-NACL 20-5-0.45 MEQ/L-%-% IV SOLN
INTRAVENOUS | Status: DC
  Administered 2019-05-11: 17:00:00 via INTRAVENOUS
  Filled 2019-05-11 (×2): qty 1000

## 2019-05-11 MED ORDER — LACTATED RINGERS IV SOLN
INTRAVENOUS | Status: DC
  Administered 2019-05-11 (×2): via INTRAVENOUS

## 2019-05-11 MED ORDER — HYDROMORPHONE HCL 1 MG/ML IJ SOLN: 0.5000 mg | INTRAMUSCULAR | Status: DC | PRN

## 2019-05-11 SURGICAL SUPPLY — 49 items
BAG DECANTER FOR FLEXI CONT (MISCELLANEOUS) ×3 IMPLANT
BLADE SAW SGTL 18X1.27X75 (BLADE) ×2 IMPLANT
BLADE SAW SGTL 18X1.27X75MM (BLADE) ×1
BLADE SURG SZ10 CARB STEEL (BLADE) ×6 IMPLANT
CONT SPEC 4OZ CLIKSEAL STRL BL (MISCELLANEOUS) ×3 IMPLANT
COVER PERINEAL POST (MISCELLANEOUS) ×3 IMPLANT
COVER SURGICAL LIGHT HANDLE (MISCELLANEOUS) ×3 IMPLANT
COVER WAND RF STERILE (DRAPES) ×3 IMPLANT
CUP SECTOR GRIPTON 50MM (Cup) ×2 IMPLANT
DECANTER SPIKE VIAL GLASS SM (MISCELLANEOUS) ×6 IMPLANT
DRAPE STERI IOBAN 125X83 (DRAPES) ×3 IMPLANT
DRAPE U-SHAPE 47X51 STRL (DRAPES) ×6 IMPLANT
DRESSING AQUACEL AG SP 3.5X10 (GAUZE/BANDAGES/DRESSINGS) IMPLANT
DRSG AQUACEL AG ADV 3.5X10 (GAUZE/BANDAGES/DRESSINGS) ×3 IMPLANT
DRSG AQUACEL AG SP 3.5X10 (GAUZE/BANDAGES/DRESSINGS) ×3
DURAPREP 26ML APPLICATOR (WOUND CARE) ×3 IMPLANT
ELECT BLADE TIP CTD 4 INCH (ELECTRODE) ×3 IMPLANT
ELECT REM PT RETURN 15FT ADLT (MISCELLANEOUS) ×3 IMPLANT
ELIMINATOR HOLE APEX DEPUY (Hips) ×2 IMPLANT
GLOVE BIO SURGEON STRL SZ7.5 (GLOVE) ×3 IMPLANT
GLOVE BIO SURGEON STRL SZ8.5 (GLOVE) ×3 IMPLANT
GLOVE BIOGEL PI IND STRL 8 (GLOVE) ×1 IMPLANT
GLOVE BIOGEL PI IND STRL 9 (GLOVE) ×1 IMPLANT
GLOVE BIOGEL PI INDICATOR 8 (GLOVE) ×2
GLOVE BIOGEL PI INDICATOR 9 (GLOVE) ×2
GOWN STRL REUS W/TWL XL LVL3 (GOWN DISPOSABLE) ×6 IMPLANT
HEAD FEMORAL 32 CERAMIC (Hips) ×2 IMPLANT
HOLDER FOLEY CATH W/STRAP (MISCELLANEOUS) ×3 IMPLANT
KIT TURNOVER KIT A (KITS) ×2 IMPLANT
LINER ACET PNNCL PLUS4 NEUTRAL (Hips) IMPLANT
MANIFOLD NEPTUNE II (INSTRUMENTS) ×3 IMPLANT
NDL HYPO 21X1.5 SAFETY (NEEDLE) ×2 IMPLANT
NEEDLE HYPO 21X1.5 SAFETY (NEEDLE) ×6 IMPLANT
NS IRRIG 1000ML POUR BTL (IV SOLUTION) ×3 IMPLANT
PACK ANTERIOR HIP CUSTOM (KITS) ×3 IMPLANT
PINNACLE PLUS 4 NEUTRAL (Hips) ×3 IMPLANT
STEM FEM ACTIS STD SZ7 (Nail) ×2 IMPLANT
SUT ETHIBOND NAB CT1 #1 30IN (SUTURE) ×3 IMPLANT
SUT VIC AB 0 CT1 27 (SUTURE) ×2
SUT VIC AB 0 CT1 27XBRD ANBCTR (SUTURE) ×1 IMPLANT
SUT VIC AB 1 CTX 36 (SUTURE) ×2
SUT VIC AB 1 CTX36XBRD ANBCTR (SUTURE) ×1 IMPLANT
SUT VIC AB 2-0 CT1 27 (SUTURE) ×2
SUT VIC AB 2-0 CT1 TAPERPNT 27 (SUTURE) ×1 IMPLANT
SUT VIC AB 3-0 CT1 27 (SUTURE) ×2
SUT VIC AB 3-0 CT1 TAPERPNT 27 (SUTURE) ×1 IMPLANT
SYR CONTROL 10ML LL (SYRINGE) ×9 IMPLANT
TRAY FOLEY MTR SLVR 16FR STAT (SET/KITS/TRAYS/PACK) ×2 IMPLANT
YANKAUER SUCT BULB TIP 10FT TU (MISCELLANEOUS) ×3 IMPLANT

## 2019-05-11 NOTE — Evaluation (Signed)
Physical Therapy Evaluation Patient Details Name: Ashley Dyer MRN: [id number] DOB: 1946-07-04 Today's Date: 05/11/2019   History of Present Illness  L DA-THA; PMH of B TSA  Clinical Impression  Pt is s/p THA resulting in the deficits listed below (see PT Problem List). Pt has some tingling in L foot and decreased motor control of LLE, spinal has not yet fully worn off. Assisted pt with stand pivot transfer with RW to recliner. Initiated THA HEP. Ambulation deferred 2* spinal not fully worn off. Good progress expected. Pt will benefit from skilled PT to increase their independence and safety with mobility to allow discharge to the venue listed below.      Follow Up Recommendations Follow surgeon's recommendation for DC plan and follow-up therapies    Equipment Recommendations  3in1 (PT)    Recommendations for Other Services       Precautions / Restrictions Precautions Precautions: Fall Restrictions Weight Bearing Restrictions: No Other Position/Activity Restrictions: WBAT      Mobility  Bed Mobility Overal bed mobility: Needs Assistance Bed Mobility: Supine to Sit     Supine to sit: Min assist     General bed mobility comments: assist for LLE  Transfers Overall transfer level: Needs assistance Equipment used: Rolling walker (2 wheeled) Transfers: Sit to/from [facility] Sit to Stand: Min assist;From elevated surface;+2 safety/equipment Stand pivot transfers: Min assist;+2 safety/equipment       General transfer comment: SPT with +2 for safety 2* decrease motor/sensory function LLE (spinal not fully worn off)  Ambulation/Gait                Stairs            Wheelchair Mobility    Modified Rankin (Stroke Patients Only)       Balance Overall balance assessment: Needs assistance Sitting-balance support: Feet supported Sitting balance-Leahy Scale: Good     Standing balance support: Bilateral upper extremity  supported Standing balance-Leahy Scale: Poor Standing balance comment: relies on BUE support                             Pertinent Vitals/Pain Pain Assessment: 0-10 Pain Score: 3  Pain Location: L hip Pain Descriptors / Indicators: Sore Pain Intervention(s): Limited activity within patient's tolerance;Monitored during session;Premedicated before session;Ice applied    Home Living Family/patient expects to be discharged to:: Private residence Living Arrangements: Spouse/significant other Available Help at Discharge: Available 24 hours/day   Home Access: Stairs to enter Entrance Stairs-Rails: None Entrance Stairs-Number of Steps: 2 Home Layout: One level Home Equipment: Walker - 2 wheels;Cane - single point      Prior Function Level of Independence: Independent               Hand Dominance        Extremity/Trunk Assessment   Upper Extremity Assessment Upper Extremity Assessment: Overall WFL for tasks assessed    Lower Extremity Assessment Lower Extremity Assessment: LLE deficits/detail LLE Deficits / Details: knee ext -4/5, hip AAROM WFL, hip flexion 2/5 LLE Sensation: decreased light touch(decr L foot (spinal not fully worn off)) LLE Coordination: decreased gross motor(spinal not worn off)    Cervical / Trunk Assessment Cervical / Trunk Assessment: Normal  Communication   Communication: No difficulties  Cognition Arousal/Alertness: Awake/alert Behavior During Therapy: WFL for tasks assessed/performed Overall Cognitive Status: Within Functional Limits for tasks assessed  General Comments      Exercises Total Joint Exercises Ankle Circles/Pumps: AROM;Both;10 reps;Supine Heel Slides: AAROM;Left;15 reps;Supine Hip ABduction/ADduction: AAROM;Left;15 reps;Supine Long Arc Quad: AROM;Left;5 reps;Seated   Assessment/Plan    PT Assessment Patient needs continued PT services  PT Problem List  Decreased strength;Decreased activity tolerance;Decreased balance;Decreased mobility;Pain;Decreased knowledge of use of DME;Decreased coordination       PT Treatment Interventions DME instruction;Gait training;Stair training;Functional mobility training;Therapeutic exercise;Therapeutic activities;Patient/family education    PT Goals (Current goals can be found in the Care Plan section)  Acute Rehab PT Goals Patient Stated Goal: be able to walk up stairs, garden PT Goal Formulation: With patient Time For Goal Achievement: 05/18/19 Potential to Achieve Goals: Good    Frequency 7X/week   Barriers to discharge        Co-evaluation               AM-PAC PT "6 Clicks" Mobility  Outcome Measure Help needed turning from your back to your side while in a flat bed without using bedrails?: A Little Help needed moving from lying on your back to sitting on the side of a flat bed without using bedrails?: A Little Help needed moving to and from a bed to a chair (including a wheelchair)?: A Little Help needed standing up from a chair using your arms (e.g., wheelchair or bedside chair)?: A Lot Help needed to walk in hospital room?: A Lot Help needed climbing 3-5 steps with a railing? : Total 6 Click Score: 14    End of Session Equipment Utilized During Treatment: Gait belt Activity Tolerance: Patient tolerated treatment well Patient left: in chair;with call bell/phone within reach;with chair alarm set Nurse Communication: Mobility status PT Visit Diagnosis: Difficulty in walking, not elsewhere classified (R26.2);Muscle weakness (generalized) (M62.81);Pain Pain - Right/Left: Left Pain - part of body: Hip    Time: 1749-1816 PT Time Calculation (min) (ACUTE ONLY): 27 min   Charges:   PT Evaluation $PT Eval Low Complexity: 1 Low PT Treatments $Therapeutic Activity: 8-22 mins        Blondell Reveal Kistler PT 05/11/2019  Acute Rehabilitation Services Pager  [phone] Office [phone]

## 2019-05-11 NOTE — Plan of Care (Signed)

## 2019-05-11 NOTE — Anesthesia Procedure Notes (Signed)
Date/Time: 05/11/2019 11:30 AM Performed by: Lollie Sails, CRNA Pre-anesthesia Checklist: Patient identified, Emergency Drugs available, Suction available and Patient being monitored Oxygen Delivery Method: Simple face mask

## 2019-05-11 NOTE — Op Note (Signed)
PATIENT ID:      Ashley Dyer  MRN:     [id number] DOB/AGE:    [birth year] / 73 y.o.  OPERATIVE REPORT   DATE OF PROCEDURE:  05/11/2019      PREOPERATIVE DIAGNOSIS:  LEFT HIP OSTEOARTHRITIS                                                         POSTOPERATIVE DIAGNOSIS:  Same                                                         PROCEDURE: Anterior L total hip arthroplasty using a 50 mm DePuy Pinnacle  Cup, [employer], 0-degree polyethylene liner, a +1 mm x 62mm ceramic head, a 7std Depuy Actis stem  SURGEON: Kerin Salen  ASSISTANT:   Kerry Hough. [employer]  (present throughout entire procedure and necessary for timely completion of the procedure)   ANESTHESIA: Spinal, Exparel 133mg  injection BLOOD LOSS: 300 cc FLUID REPLACEMENT: 1600 cc crystalloid TRANEXAMIC ACID: 1gm IV, 2gm Topical COMPLICATIONS: none    INDICATIONS FOR PROCEDURE: A 73 y.o. year-old With  LEFT HIP OSTEOARTHRITIS   for 2 years, x-rays show bone-on-bone arthritic changes, and osteophytes. Despite conservative measures with observation, anti-inflammatory medicine, narcotics, use of a cane, has severe unremitting pain and can ambulate only a few blocks before resting. Patient desires elective L total hip arthroplasty to decrease pain and increase function. The risks, benefits, and alternatives were discussed at length including but not limited to the risks of infection, bleeding, nerve injury, stiffness, blood clots, the need for revision surgery, cardiopulmonary complications, among others, and they were willing to proceed. Questions answered      PROCEDURE IN DETAIL: The patient was identified by armband,   received preoperative IV antibiotics in the holding area at [facility], taken to the operating room , appropriate anesthetic monitors   were attached and anesthesia was induced with the patient on the gurney. HANA boots were applied to the feet, and the patient  was transferred to the HANA  table with a peroneal post and support underneath the non-operative leg. Theoperative lower extremity was then prepped and draped in the usual sterile fashion from just above the iliac crest to the knee. And a timeout procedure was performed. Skin along incision area was injected with 10 cc of Exparel solution. We then made a 12 cm incision along the interval at the leading edge of the tensor fascia lata of starting at 2 cm lateral to the ASIS. Small bleeders in the skin and subcutaneous tissue identified and cauterized we dissected down to the fascia and made an incision in the fascia allowing [place] to elevate the fascia of the tensor muscle and exploited the interval between the rectus and the tensor fascia lata. A Cobra retractor was then placed along the superior neck of the femur. A cerebellar retractor was used to expose the interval between the tensor fascia lata and the rectus femoris.  We identified and cauterized the ascending branch of the anterior circumflex artery. A second Cobra retractor along the inferior neck of the femur.  A small Hohmann retractor was placed underneath the origin of the rectus femoris, giving [place] good medial exposure. Using Ronguers fatty tissue was removed from in front of the anterior capsule. The capsule was then incised, starting out at the superior anterior rim of the acetabulum going laterally along the anterior neck. The capsule was then teed along the neck superiorly and inferiorly. Electrocautery was used to release capsule from the anterior and medial neck of the femur to allow external rotation. Cobra retractors were then placed along the inferior and superior neck allowing [place] to perform a standard neck cut and removed the femoral head with a power corkscrew. We then placed a medium bent homan retractor in the cotyloid notch and posteriorly along the acetabular rim a narrow Cobra retractor. Exposed labral tissue and osteophytes were then removed. We then sequentially reamed  up to a 49 mm basket reamer obtaining good coverage in all quadrants, verified by C-arm imaging. Under C-arm control we then hammered into place a 50 mm Pinnacle cup in 45 of abduction and 15 of anteversion. The cup seated nicely and required no supplemental screws. We then placed a central hole Eliminator and a 0 polyethylene liner. The foot was then externally rotated to 130-140. The limb was extended and adducted to the floor, delivering the proximal femur up into the wound. A medium curved Hohmann retractor was placed over the greater trochanter and a long Homan retractor along the posterior femoral neck completing the exposure and lateralizing the femur. We then performed releases superiorly and and inferiorly of the capsule going back to the pirformis fossa superiorly and to the lesser trochanter inferiorly. We then entered the proximal femur with the box cutting offset chisel followed by, a canal sounder, the chili pepper and broaching up to a 7 broach. This seated nicely and we reamed the calcar. A trial reduction was performed with a 1 mm X 32 mm head.The limb lengths were excellent the hip was stable in 90 of external rotation. At this point the trial components removed and we hammered into place a # 6 std  Offset Actis stem with Gryption coating. A + 1 mm x 32 head was then hammered into place. The hip was reduced and final C-arm images obtained. The wound was thoroughly irrigated with normal saline solution. We repaired the ant capsule and the tensor fascia lot a with running 0 vicryl suture. the subcutaneous tissue was closed with 2-0 and 3-0 Vicryl suture followed by an Aquacil dressing. At this point the patient was awaken and transferred to hospital gurney without difficulty.   Kerin Salen 05/11/2019, 7:32 AM

## 2019-05-11 NOTE — Transfer of Care (Signed)
Immediate Anesthesia Transfer of Care Note  Patient: Ashley Dyer  Procedure(s) Performed: LEFT TOTAL HIP ARTHROPLASTY ANTERIOR APPROACH (Left Hip)  Patient Location: PACU  Anesthesia Type:Spinal  Level of Consciousness: awake, oriented and patient cooperative  Airway & Oxygen Therapy: Patient Spontanous Breathing and Patient connected to face mask oxygen  Post-op Assessment: Report given to RN and Post -op Vital signs reviewed and stable  Post vital signs: Reviewed and stable  Last Vitals:  Vitals Value Taken Time  BP 96/42 05/11/19 1320  Temp    Pulse 58 05/11/19 1322  Resp 0 05/11/19 1322  SpO2 99 % 05/11/19 1322  Vitals shown include unvalidated device data.  Last Pain:  Vitals:   05/11/19 0945  TempSrc: Oral  PainSc: 4       Patients Stated Pain Goal: 5 ([id number])  Complications: No apparent anesthesia complications

## 2019-05-11 NOTE — Discharge Instructions (Signed)

## 2019-05-11 NOTE — Anesthesia Procedure Notes (Signed)
Spinal  Patient location during procedure: OR Start time: 05/11/2019 11:32 AM End time: 05/11/2019 11:38 AM Staffing Resident/CRNA: Lollie Sails, CRNA Performed: resident/CRNA  Preanesthetic Checklist Completed: patient identified, site marked, surgical consent, pre-op evaluation, timeout performed, IV checked, risks and benefits discussed and monitors and equipment checked Spinal Block Patient position: sitting Prep: site prepped and draped and DuraPrep Patient monitoring: heart rate, continuous pulse ox, blood pressure and cardiac monitor Approach: midline Location: L3-4 Injection technique: single-shot Needle Needle type: Sprotte  Needle gauge: 24 G Needle length: 10 cm Additional Notes Expiration date on kit noted and within range.  Good CSF flow noted with no heme or c/o paresthesia.   Patient tolerated well.

## 2019-05-11 NOTE — Interval H&P Note (Signed)
History and Physical Interval Note:  05/11/2019 10:06 AM  Ashley Dyer  has presented today for surgery, with the diagnosis of LEFT HIP OSTEOARTHRITIS.  The various methods of treatment have been discussed with the patient and family. After consideration of risks, benefits and other options for treatment, the patient has consented to  Procedure(s): LEFT TOTAL HIP ARTHROPLASTY ANTERIOR APPROACH (Left) as a surgical intervention.  The patient's history has been reviewed, patient examined, no change in status, stable for surgery.  I have reviewed the patient's chart and labs.  Questions were answered to the patient's satisfaction.     Kerin Salen

## 2019-05-11 NOTE — Anesthesia Postprocedure Evaluation (Signed)
Anesthesia Post Note  Patient: [place]  Procedure(s) Performed: LEFT TOTAL HIP ARTHROPLASTY ANTERIOR APPROACH (Left Hip)     Patient location during evaluation: PACU Anesthesia Type: Spinal Level of consciousness: oriented and awake and alert Pain management: pain level controlled Vital Signs Assessment: post-procedure vital signs reviewed and stable Respiratory status: spontaneous breathing, respiratory function stable and patient connected to nasal cannula oxygen Cardiovascular status: blood pressure returned to baseline and stable Postop Assessment: no headache, no backache and no apparent nausea or vomiting Anesthetic complications: no    Last Vitals:  Vitals:   05/11/19 1330 05/11/19 1345  BP: (!) 111/49 (!) 113/59  Pulse: 68 65  Resp: 13 11  Temp:    SpO2: 100% 98%    Last Pain:  Vitals:   05/11/19 1400  TempSrc:   PainSc: Asleep            L Sensory Level: L3-Anterior knee, lower leg (05/11/19 1400) R Sensory Level: L3-Anterior knee, lower leg (05/11/19 1400)  Shadai Mcclane S

## 2019-05-12 ENCOUNTER — Encounter (HOSPITAL_COMMUNITY): Payer: Self-pay | Admitting: Orthopedic Surgery

## 2019-05-12 DIAGNOSIS — Z87891 Personal history of nicotine dependence: Secondary | ICD-10-CM | POA: Diagnosis not present

## 2019-05-12 DIAGNOSIS — Z79899 Other long term (current) drug therapy: Secondary | ICD-10-CM | POA: Diagnosis not present

## 2019-05-12 DIAGNOSIS — E785 Hyperlipidemia, unspecified: Secondary | ICD-10-CM | POA: Diagnosis not present

## 2019-05-12 DIAGNOSIS — M1990 Unspecified osteoarthritis, unspecified site: Secondary | ICD-10-CM | POA: Diagnosis not present

## 2019-05-12 DIAGNOSIS — I10 Essential (primary) hypertension: Secondary | ICD-10-CM | POA: Diagnosis not present

## 2019-05-12 DIAGNOSIS — M1612 Unilateral primary osteoarthritis, left hip: Secondary | ICD-10-CM | POA: Diagnosis not present

## 2019-05-12 DIAGNOSIS — J4520 Mild intermittent asthma, uncomplicated: Secondary | ICD-10-CM | POA: Diagnosis not present

## 2019-05-12 DIAGNOSIS — Z7982 Long term (current) use of aspirin: Secondary | ICD-10-CM | POA: Diagnosis not present

## 2019-05-12 DIAGNOSIS — R262 Difficulty in walking, not elsewhere classified: Secondary | ICD-10-CM | POA: Diagnosis not present

## 2019-05-12 LAB — BASIC METABOLIC PANEL
Anion gap: 13 (ref 5–15)
BUN: 20 mg/dL (ref 8–23)
CO2: 21 mmol/L — ABNORMAL LOW (ref 22–32)
Calcium: 8.8 mg/dL — ABNORMAL LOW (ref 8.9–10.3)
Chloride: 105 mmol/L (ref 98–111)
Creatinine, Ser: 0.65 mg/dL (ref 0.44–1.00)
GFR calc Af Amer: >60 mL/min (ref >60)
GFR calc non Af Amer: >60 mL/min (ref >60)
Glucose, Bld: 208 mg/dL — ABNORMAL HIGH (ref 70–99)
Potassium: 4 mmol/L (ref 3.5–5.1)
Sodium: 139 mmol/L (ref 135–145)

## 2019-05-12 LAB — CBC
HCT: 34 % — ABNORMAL LOW (ref 36.0–46.0)
Hemoglobin: 11.1 g/dL — ABNORMAL LOW (ref 12.0–15.0)
MCH: 31.5 pg (ref 26.0–34.0)
MCHC: 32.6 g/dL (ref 30.0–36.0)
MCV: 96.6 fL (ref 80.0–100.0)
Platelets: 179 10*3/uL (ref 150–400)
RBC: 3.52 MIL/uL — ABNORMAL LOW (ref 3.87–5.11)
RDW: 13.2 % (ref 11.5–15.5)
WBC: 10.4 10*3/uL (ref 4.0–10.5)
nRBC: 0 % (ref 0.0–0.2)

## 2019-05-12 NOTE — Progress Notes (Signed)
Physical Therapy Treatment Patient Details Name: Ashley Dyer MRN: [id number] DOB: 31-Mar-1946 Today's Date: 05/12/2019    History of Present Illness L DA-THA; PMH of B TSA    PT Comments    Pt ambulated 160' with RW, performed HEP with supervision, and initiated stair training. Will plan a second session this afternoon to review HEP. Pt is progressing well with mobility.   Follow Up Recommendations  Follow surgeon's recommendation for DC plan and follow-up therapies     Equipment Recommendations  3in1 (PT)    Recommendations for Other Services       Precautions / Restrictions Precautions Precautions: Fall Restrictions Weight Bearing Restrictions: No Other Position/Activity Restrictions: WBAT    Mobility  Bed Mobility Overal bed mobility: Modified Independent Bed Mobility: Supine to Sit     Supine to sit: Modified independent (Device/Increase time);HOB elevated        Transfers Overall transfer level: Needs assistance Equipment used: Rolling walker (2 wheeled) Transfers: Sit to/from Stand Sit to Stand: Supervision         General transfer comment: VCs hand placement  Ambulation/Gait Ambulation/Gait assistance: Supervision Gait Distance (Feet): 160 Feet Assistive device: Rolling walker (2 wheeled) Gait Pattern/deviations: Step-to pattern;Decreased stride length Gait velocity: decr   General Gait Details: no loss of balance, VCs positioning in RW   Stairs Stairs: Yes Stairs assistance: Min guard Stair Management: Two rails;Forwards;Step to pattern Number of Stairs: 3 General stair comments: VCs sequencing   Wheelchair Mobility    Modified Rankin (Stroke Patients Only)       Balance Overall balance assessment: Needs assistance Sitting-balance support: Feet supported Sitting balance-Leahy Scale: Good     Standing balance support: Bilateral upper extremity supported Standing balance-Leahy Scale: Fair Standing balance comment: relies  on BUE support                            Cognition Arousal/Alertness: Awake/alert Behavior During Therapy: WFL for tasks assessed/performed Overall Cognitive Status: Within Functional Limits for tasks assessed                                        Exercises Total Joint Exercises Ankle Circles/Pumps: AROM;Both;10 reps;Supine Quad Sets: AROM;Left;10 reps;Supine Short Arc Quad: AROM;Left;10 reps;Supine Heel Slides: AAROM;Left;Supine;10 reps Hip ABduction/ADduction: Left;Supine;AROM;10 reps    General Comments        Pertinent Vitals/Pain Pain Score: 3  Pain Location: L hip Pain Descriptors / Indicators: Sore Pain Intervention(s): Limited activity within patient's tolerance;Monitored during session;Premedicated before session;Ice applied    Home Living                      Prior Function            PT Goals (current goals can now be found in the care plan section) Acute Rehab PT Goals Patient Stated Goal: be able to walk up stairs, garden PT Goal Formulation: With patient Time For Goal Achievement: 05/18/19 Potential to Achieve Goals: Good Progress towards PT goals: Progressing toward goals    Frequency    7X/week      PT Plan Current plan remains appropriate    Co-evaluation              AM-PAC PT "6 Clicks" Mobility   Outcome Measure  Help needed turning from your back to your side while in a  flat bed without using bedrails?: None Help needed moving from lying on your back to sitting on the side of a flat bed without using bedrails?: A Little Help needed moving to and from a bed to a chair (including a wheelchair)?: A Little Help needed standing up from a chair using your arms (e.g., wheelchair or bedside chair)?: A Little Help needed to walk in hospital room?: A Little Help needed climbing 3-5 steps with a railing? : A Little 6 Click Score: 19    End of Session Equipment Utilized During Treatment: Gait  belt Activity Tolerance: Patient tolerated treatment well Patient left: in chair;with call bell/phone within reach;with chair alarm set Nurse Communication: Mobility status PT Visit Diagnosis: Difficulty in walking, not elsewhere classified (R26.2);Muscle weakness (generalized) (M62.81);Pain Pain - Right/Left: Left Pain - part of body: Hip     Time: [id number] PT Time Calculation (min) (ACUTE ONLY): 20 min  Charges:  $Gait Training: 8-22 mins                     Blondell Reveal Kistler PT 05/12/2019  Acute Rehabilitation Services Pager [phone] Office [phone]

## 2019-05-12 NOTE — Progress Notes (Signed)
PATIENT ID: Ashley Dyer  MRN: [id number]  DOB/AGE:  73/10/1945 / 73 y.o.  1 Day Post-Op Procedure(s) (LRB): LEFT TOTAL HIP ARTHROPLASTY ANTERIOR APPROACH (Left)    PROGRESS NOTE Subjective: Patient is alert, oriented, no Nausea, no Vomiting, yes passing gas, . Taking PO well. Denies SOB, Chest or Calf Pain. Using Incentive Spirometer, PAS in place. Ambulate in room Patient reports pain as  2/10  .    Objective: Vital signs in last 24 hours: Vitals:   05/11/19 1845 05/11/19 2150 05/12/19 0051 05/12/19 0530  BP: 134/73 109/84 114/62 (Abnormal) 120/59  Pulse: 84 91 82 74  Resp: 16 14 14 16   Temp: (Abnormal) 97.5 F (36.4 C) 97.9 F (36.6 C) 97.6 F (36.4 C) (Abnormal) 97.5 F (36.4 C)  TempSrc: Oral Oral Oral Oral  SpO2: 100% 96% 94% 99%  Weight:      Height:          Intake/Output from previous day: I/O last 3 completed shifts: In: 3875.8 [P.O.:600; I.V.:2975.8; IV Piggyback:300] Out: 2475 [Urine:2225; Blood:250]   Intake/Output this shift: No intake/output data recorded.   LABORATORY DATA: Recent Labs    05/12/19 0226  WBC 10.4  HGB 11.1*  HCT 34.0*  PLT 179  NA 139  K 4.0  CL 105  CO2 21*  BUN 20  CREATININE 0.65  GLUCOSE 208*  CALCIUM 8.8*    Examination: Neurologically intact ABD soft Neurovascular intact Sensation intact distally Intact pulses distally Dorsiflexion/Plantar flexion intact Incision: dressing C/D/I No cellulitis present Compartment soft} XR AP&Lat of hip shows well placed\fixed THA  Assessment:   1 Day Post-Op Procedure(s) (LRB): LEFT TOTAL HIP ARTHROPLASTY ANTERIOR APPROACH (Left) ADDITIONAL DIAGNOSIS:  Expected Acute Blood Loss Anemia, Hypertension  Patient's anticipated LOS is less than 2 midnights, meeting these requirements: - Younger than 73 - Lives within 1 hour of care - Has a competent adult at home to recover with post-op recover - NO history of  - Chronic pain requiring opiods  - Diabetes  - Coronary Artery  Disease  - Heart failure  - Heart attack  - Stroke  - DVT/VTE  - Cardiac arrhythmia  - Respiratory Failure/COPD  - Renal failure  - Anemia  - Advanced Liver disease       Plan: PT/OT WBAT, THA  DVT Prophylaxis: SCDx72 hrs, ASA 81 mg BID x 2 weeks  DISCHARGE PLAN: Home , today after PT  DISCHARGE NEEDS: HHPT, Walker and 3-in-1 comode seatPatient ID: Ashley Dyer, female   DOB: 1945-12-03, 73 y.o.   MRN: [id number]

## 2019-05-12 NOTE — Discharge Summary (Signed)
Patient ID: Ashley Dyer MRN: [id number] DOB/AGE: [birth year] 73 y.o.  Admit date: 05/11/2019 Discharge date: 05/12/2019  Admission Diagnoses:  Principal Problem:   Osteoarthritis of left hip Active Problems:   S/P hip replacement, left   Discharge Diagnoses:  Same  Past Medical History:  Diagnosis Date  . Allergy   . Anxiety    ED- visit for anxiety  . Arthritis    OA- shoulder(both) , hands  . Asthma   . Complication of anesthesia    fear- of feeling paralyzed - 1980's   . History of kidney stones 1980's   surgery  . Hyperlipidemia   . Hypertension   . 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 Walking pneumonia     Surgeries: Procedure(s): LEFT TOTAL HIP ARTHROPLASTY ANTERIOR APPROACH on 05/11/2019   Consultants:   Discharged Condition: Improved  Hospital Course: Ashley Dyer is an 73 y.o. female who was admitted 05/11/2019 for operative treatment ofOsteoarthritis of left hip. Patient has severe unremitting pain that affects sleep, daily activities, and work/hobbies. After pre-op clearance the patient was taken to the operating room on 05/11/2019 and underwent  Procedure(s): LEFT TOTAL HIP ARTHROPLASTY ANTERIOR APPROACH.    Patient was given perioperative antibiotics:  Anti-infectives (From admission, onward)   Start     Dose/Rate Route Frequency Ordered Stop   05/11/19 0945  ceFAZolin (ANCEF) IVPB 2g/100 mL premix     2 g 200 mL/hr over 30 Minutes Intravenous On call to O.R. 05/11/19 0935 05/11/19 1135       Patient was given sequential compression devices, early ambulation, and chemoprophylaxis to prevent DVT.  Patient benefited maximally from hospital stay and there were no complications.    Recent vital signs:  Patient Vitals for the past 24 hrs:  BP Temp Temp src Pulse Resp SpO2 Height Weight  05/12/19 0530 (Abnormal) 120/59 (Abnormal) 97.5 F (36.4 C) Oral 74 16 99 % no documentation no documentation  05/12/19 0051 114/62 97.6 F (36.4 C)  Oral 82 14 94 % no documentation no documentation  05/11/19 2150 109/84 97.9 F (36.6 C) Oral 91 14 96 % no documentation no documentation  05/11/19 1845 134/73 (Abnormal) 97.5 F (36.4 C) Oral 84 16 100 % no documentation no documentation  05/11/19 1741 129/69 (Abnormal) 97.5 F (36.4 C) Oral 79 16 100 % no documentation no documentation  05/11/19 1653 (Abnormal) 150/70 (Abnormal) 97.4 F (36.3 C) no documentation 70 16 100 % no documentation no documentation  05/11/19 1649 (Abnormal) 160/74 no documentation no documentation 74 16 100 % no documentation no documentation  05/11/19 1533 111/69 no documentation Oral 70 18 98 % 5\' 6"  (1.676 m) 72.1 kg  05/11/19 1500 125/60 no documentation no documentation 72 17 100 % no documentation no documentation  05/11/19 1445 119/65 no documentation no documentation 67 17 100 % no documentation no documentation  05/11/19 1430 129/67 no documentation no documentation 64 12 97 % no documentation no documentation  05/11/19 1415 123/66 no documentation no documentation 70 13 100 % no documentation no documentation  05/11/19 1400 127/62 no documentation no documentation 70 12 100 % no documentation no documentation  05/11/19 1345 (Abnormal) 113/59 no documentation no documentation 65 11 98 % no documentation no documentation  05/11/19 1330 (Abnormal) 111/49 no documentation no documentation 68 13 100 % no documentation no documentation  05/11/19 1320 (Abnormal) 96/42 (Abnormal) 97.4 F (36.3 C) no documentation 61 17 97 % no documentation no documentation  05/11/19 0945 (Abnormal) 145/57 98.7 F (37.1 C)  Oral 70 18 100 % no documentation no documentation     Recent laboratory studies:  Recent Labs    05/12/19 0226  WBC 10.4  HGB 11.1*  HCT 34.0*  PLT 179  NA 139  K 4.0  CL 105  CO2 21*  BUN 20  CREATININE 0.65  GLUCOSE 208*  CALCIUM 8.8*     Discharge Medications:   Allergies as of 05/12/2019   No Known Allergies     Medication List     Stop taking these medications   acetaminophen 500 MG tablet Commonly known as: TYLENOL     Take these medications   Alirocumab 75 MG/ML Soaj Commonly known as: Praluent Inject 75 mg as directed every 14 (fourteen) days.   aspirin EC 81 MG tablet Take 1 tablet (81 mg total) by mouth 2 (two) times daily.   CALCIUM CITRATE PO Take 1 tablet by mouth daily.   cholecalciferol 25 MCG (1000 UT) tablet Commonly known as: VITAMIN D Take 1,000 Units by mouth 2 (two) times daily.   cromolyn 5.2 MG/ACT nasal spray Commonly known as: NASALCROM Place 1 spray into both nostrils 2 (two) times daily.   oxyCODONE-acetaminophen 5-325 MG tablet Commonly known as: PERCOCET/ROXICET Take 1 tablet by mouth every 4 (four) hours as needed for severe pain.   tiZANidine 2 MG tablet Commonly known as: ZANAFLEX Take 1 tablet (2 mg total) by mouth every 6 (six) hours as needed.   triamterene-hydrochlorothiazide 75-50 MG tablet Commonly known as: MAXZIDE Take 1 tablet by mouth every other day alternating with 1/2 tablet the other days What changed:   how much to take  how to take this  when to take this   vitamin C 1000 MG tablet Take 1,000 mg by mouth daily.        Durable Medical Equipment  (From admission, onward)         Start     Ordered   05/11/19 1531  DME Walker rolling  Once    Question:  Patient needs a walker to treat with the following condition  Answer:  Status post total hip replacement, left   05/11/19 1530   05/11/19 1531  DME 3 n 1  Once     05/11/19 1530           Discharge Care Instructions  (From admission, onward)         Start     Ordered   05/12/19 0000  Change dressing    Comments: Change dressing Only if drainage exceeds 40% of window on dressing   05/12/19 0809          Diagnostic Studies: Dg C-arm 1-60 Min-no Report  Result Date: 05/11/2019 Fluoroscopy was utilized by the requesting physician.  No radiographic interpretation.   Dg Hip  Operative Unilat W Or W/o Pelvis Left  Result Date: 05/11/2019 CLINICAL DATA:  LEFT hip arthroplasty EXAM: OPERATIVE LEFT HIP (WITH PELVIS IF PERFORMED) 2 VIEWS TECHNIQUE: Fluoroscopic spot image(s) were submitted for interpretation post-operatively. COMPARISON:  None. FINDINGS: LEFT total hip arthroplasty identified without dislocation. No complicating features are noted. IMPRESSION: LEFT total hip arthroplasty without complicating features. Electronically Signed   By: Margarette Canada M.D.   On: 05/11/2019 13:51    Disposition: Discharge disposition: 01-Home or Self Care       Discharge Instructions    Call MD / Call 911   Complete by: As directed    If you experience chest pain or shortness of breath, CALL 911  and be transported to the hospital emergency room.  If you develope a fever above 101 F, pus (white drainage) or increased drainage or redness at the wound, or calf pain, call your surgeon's office.   Change dressing   Complete by: As directed    Change dressing Only if drainage exceeds 40% of window on dressing   Constipation Prevention   Complete by: As directed    Drink plenty of fluids.  Prune juice may be helpful.  You may use a stool softener, such as Colace (over the counter) 100 mg twice a day.  Use MiraLax (over the counter) for constipation as needed.   Diet - low sodium heart healthy   Complete by: As directed    Increase activity slowly as tolerated   Complete by: As directed       Follow-up Information    Frederik Pear, MD In 2 weeks.   Specialty: Orthopedic Surgery Contact information: [place] [address] [phone]            Signed: Kerin Salen 05/12/2019, 8:09 AM

## 2019-05-12 NOTE — Plan of Care (Signed)
  Problem: Clinical Measurements: Goal: Will remain free from infection Outcome: Progressing Goal: Respiratory complications will improve Outcome: Progressing Goal: Cardiovascular complication will be avoided Outcome: Progressing   Problem: Pain Managment: Goal: General experience of comfort will improve Outcome: Progressing   Problem: Safety: Goal: Ability to remain free from injury will improve Outcome: Progressing   Problem: Education: Goal: Knowledge of the prescribed therapeutic regimen will improve Outcome: Progressing Goal: Understanding of discharge needs will improve Outcome: Progressing

## 2019-05-12 NOTE — Progress Notes (Signed)
   05/12/19 1350  PT Visit Information  Last PT Received On 05/12/19  Assistance Needed +1  History of Present Illness L DA-THA; PMH of B TSA  Subjective Data  Patient Stated Goal be able to walk up stairs, garden  Precautions  Precautions Fall  Restrictions  Weight Bearing Restrictions No  Other Position/Activity Restrictions WBAT  Pain Assessment  Pain Assessment 0-10  Pain Score 3  Pain Location L hip  Pain Descriptors / Indicators Sore  Pain Intervention(s) Limited activity within patient's tolerance;Monitored during session;Repositioned  Cognition  Arousal/Alertness Awake/alert  Behavior During Therapy WFL for tasks assessed/performed  Overall Cognitive Status Within Functional Limits for tasks assessed  Bed Mobility  Overal bed mobility Modified Independent  Bed Mobility Sit to Supine  Sit to supine Modified independent (Device/Increase time)  Transfers  Overall transfer level Needs assistance  Equipment used Rolling walker (2 wheeled)  Transfers Sit to/from Stand  Sit to Stand Supervision  General transfer comment VCs hand placement  Ambulation/Gait  Ambulation/Gait assistance Supervision  Gait Distance (Feet) 200 Feet  Assistive device Rolling walker (2 wheeled)  Gait Pattern/deviations Step-to pattern;Step-through pattern;Decreased stride length;Decreased weight shift to left  Stairs Yes  Stairs assistance Min guard  Stair Management One rail Right;One rail Left;Step to pattern;With cane  Number of Stairs 3  General stair comments verbal cues for sequence and technique  Balance  Standing balance-Leahy Scale Fair  Total Joint Exercises  Ankle Circles/Pumps AROM;Both;10 reps;Supine  Heel Slides AROM;AAROM;Left;5 reps  Hip ABduction/ADduction AROM;Left;5 reps;Standing  Marching in Standing AROM;Left;10 reps;Standing  Standing Hip Extension AROM;Left;5 reps;Standing  PT - End of Session  Equipment Utilized During Treatment Gait belt  Activity Tolerance Patient  tolerated treatment well  Patient left in bed;with call bell/phone within reach;with bed alarm set  Nurse Communication Other (comment) (ready for d/c)   PT - Assessment/Plan  PT Plan Current plan remains appropriate  PT Visit Diagnosis Difficulty in walking, not elsewhere classified (R26.2);Muscle weakness (generalized) (M62.81);Pain  Pain - Right/Left Left  Pain - part of body Hip  PT Frequency (ACUTE ONLY) 7X/week  Follow Up Recommendations Follow surgeon's recommendation for DC plan and follow-up therapies  AM-PAC PT "6 Clicks" Mobility Outcome Measure (Version 2)  Help needed turning from your back to your side while in a flat bed without using bedrails? 4  Help needed moving from lying on your back to sitting on the side of a flat bed without using bedrails? 4  Help needed moving to and from a bed to a chair (including a wheelchair)? 4  Help needed standing up from a chair using your arms (e.g., wheelchair or bedside chair)? 3  Help needed to walk in hospital room? 3  Help needed climbing 3-5 steps with a railing?  3  6 Click Score 21  Consider Recommendation of Discharge To: Home with no services  PT Goal Progression  Progress towards PT goals Progressing toward goals  Acute Rehab PT Goals  PT Goal Formulation With patient  Time For Goal Achievement 05/18/19  Potential to Achieve Goals Good  PT Time Calculation  PT Start Time (ACUTE ONLY) 1320  PT Stop Time (ACUTE ONLY) 1340  PT Time Calculation (min) (ACUTE ONLY) 20 min  PT General Charges  $$ ACUTE PT VISIT 1 Visit  PT Treatments  $Gait Training 8-22 mins

## 2019-05-26 DIAGNOSIS — M1612 Unilateral primary osteoarthritis, left hip: Secondary | ICD-10-CM | POA: Diagnosis not present

## 2019-05-28 DIAGNOSIS — Z96642 Presence of left artificial hip joint: Secondary | ICD-10-CM | POA: Diagnosis not present

## 2019-06-03 DIAGNOSIS — Z96642 Presence of left artificial hip joint: Secondary | ICD-10-CM | POA: Diagnosis not present

## 2019-06-05 DIAGNOSIS — Z96642 Presence of left artificial hip joint: Secondary | ICD-10-CM | POA: Diagnosis not present

## 2019-06-08 DIAGNOSIS — L603 Nail dystrophy: Secondary | ICD-10-CM | POA: Diagnosis not present

## 2019-06-08 DIAGNOSIS — L821 Other seborrheic keratosis: Secondary | ICD-10-CM | POA: Diagnosis not present

## 2019-06-08 DIAGNOSIS — L812 Freckles: Secondary | ICD-10-CM | POA: Diagnosis not present

## 2019-06-08 DIAGNOSIS — L578 Other skin changes due to chronic exposure to nonionizing radiation: Secondary | ICD-10-CM | POA: Diagnosis not present

## 2019-06-08 DIAGNOSIS — Z96642 Presence of left artificial hip joint: Secondary | ICD-10-CM | POA: Diagnosis not present

## 2019-06-08 DIAGNOSIS — D2239 Melanocytic nevi of other parts of face: Secondary | ICD-10-CM | POA: Diagnosis not present

## 2019-06-10 DIAGNOSIS — Z96642 Presence of left artificial hip joint: Secondary | ICD-10-CM | POA: Diagnosis not present

## 2019-06-11 ENCOUNTER — Ambulatory Visit
Admission: RE | Admit: 2019-06-11 | Discharge: 2019-06-11 | Disposition: A | Discharge status: Home / Self Care | Payer: Medicare Other | Source: Ambulatory Visit | Attending: Family Medicine | Admitting: Family Medicine

## 2019-06-11 ENCOUNTER — Other Ambulatory Visit: Payer: Self-pay

## 2019-06-11 DIAGNOSIS — Z1231 Encounter for screening mammogram for malignant neoplasm of breast: Secondary | ICD-10-CM

## 2019-06-15 DIAGNOSIS — Z96642 Presence of left artificial hip joint: Secondary | ICD-10-CM | POA: Diagnosis not present

## 2019-06-17 DIAGNOSIS — Z96642 Presence of left artificial hip joint: Secondary | ICD-10-CM | POA: Diagnosis not present

## 2019-06-22 DIAGNOSIS — Z96642 Presence of left artificial hip joint: Secondary | ICD-10-CM | POA: Diagnosis not present

## 2019-06-24 DIAGNOSIS — Z96642 Presence of left artificial hip joint: Secondary | ICD-10-CM | POA: Diagnosis not present

## 2019-06-29 DIAGNOSIS — Z124 Encounter for screening for malignant neoplasm of cervix: Secondary | ICD-10-CM | POA: Diagnosis not present

## 2019-06-30 ENCOUNTER — Ambulatory Visit: Payer: Medicare Other

## 2019-07-17 DIAGNOSIS — Z471 Aftercare following joint replacement surgery: Secondary | ICD-10-CM | POA: Diagnosis not present

## 2019-07-17 DIAGNOSIS — M19012 Primary osteoarthritis, left shoulder: Secondary | ICD-10-CM | POA: Diagnosis not present

## 2019-07-17 DIAGNOSIS — Z96612 Presence of left artificial shoulder joint: Secondary | ICD-10-CM | POA: Diagnosis not present

## 2019-07-23 ENCOUNTER — Other Ambulatory Visit: Payer: Medicare Other

## 2019-07-23 ENCOUNTER — Telehealth: Payer: Self-pay | Admitting: Family Medicine

## 2019-07-23 ENCOUNTER — Ambulatory Visit (INDEPENDENT_AMBULATORY_CARE_PROVIDER_SITE_OTHER): Payer: Medicare Other | Admitting: Family Medicine

## 2019-07-23 ENCOUNTER — Other Ambulatory Visit: Payer: Self-pay

## 2019-07-23 ENCOUNTER — Encounter: Payer: Self-pay | Admitting: Family Medicine

## 2019-07-23 VITALS — BP 124/62 | HR 78 | Temp 98.3°F | Ht 66.0 in | Wt 162.4 lb

## 2019-07-23 DIAGNOSIS — R350 Frequency of micturition: Secondary | ICD-10-CM

## 2019-07-23 DIAGNOSIS — R3915 Urgency of urination: Secondary | ICD-10-CM | POA: Insufficient documentation

## 2019-07-23 DIAGNOSIS — R109 Unspecified abdominal pain: Secondary | ICD-10-CM

## 2019-07-23 LAB — POC URINALSYSI DIPSTICK (AUTOMATED)
Bilirubin, UA: NEGATIVE
Blood, UA: POSITIVE
Glucose, UA: NEGATIVE
Ketones, UA: NEGATIVE
Nitrite, UA: NEGATIVE
Protein, UA: NEGATIVE
Spec Grav, UA: 1.015 (ref 1.010–1.025)
Urobilinogen, UA: 0.2 E.U./dL
pH, UA: 6 (ref 5.0–8.0)

## 2019-07-23 MED ORDER — TAMSULOSIN HCL 0.4 MG PO CAPS: 0.4000 mg | ORAL_CAPSULE | Freq: Every day | ORAL | 3 refills | Status: DC

## 2019-07-23 NOTE — Telephone Encounter (Signed)
Patient scheduled.

## 2019-07-23 NOTE — Telephone Encounter (Signed)
Copied from [place] [id number]. Topic: Appointment Scheduling - Scheduling Inquiry for Clinic >> Jul 23, 2019  8:17 AM Lennox Solders wrote: Reason for CRM: pt is calling and is having back pain and pain in kidney area. Pt thinks she has uti and would like an appt today  Can I use virtual visit spot to schedule pt an in office appt today with Dr. Jerline Pain, please advise.

## 2019-07-23 NOTE — Patient Instructions (Signed)
It was very nice to see you today!  Please start the flomax.  We will check a urine culture to make sure you dont have a UTI.  Let us know if not improving in 1-2 weeks.  Take care, Dr Jerline Pain  Please try these tips to maintain a healthy lifestyle:   Eat at least 3 REAL meals and 1-2 snacks per day.  Aim for no more than 5 hours between eating.  If you eat breakfast, please do so within one hour of getting up.    Each meal should contain half fruits/vegetables, one quarter protein, and one quarter carbs (no bigger than a computer mouse)   Cut down on sweet beverages. This includes juice, soda, and sweet tea.     Drink at least 1 glass of water with each meal and aim for at least 8 glasses per day   Exercise at least 150 minutes every week.

## 2019-07-23 NOTE — Progress Notes (Signed)
   Chief Complaint:  Ashley Dyer is a 73 y.o. female who presents today with a chief complaint of abdominal pain.   Assessment/Plan:  Abdominal Pain UA with blood-concern for recurrent nephrolithiasis.  She has a history of kidney stones.  Will await culture results before starting antibiotics.  Will start Flomax for presumed nephrolithiasis.  Patient would like to defer imaging for the time being.  Will try Flomax for the next 1 to 2 weeks however if symptoms worsen or do not improve as expected will need CT scan.  Urine strainer given today. Discussed reasons return to care or seek emergent care.     Subjective:  HPI:  Abdominal pain Started several days ago.  Located in right lower abdomen and radiates into her back.  She is concerned about possible recurrent kidney stone.  She had similar symptoms several months ago and was told that she had a urinary tract infection.  Does not have any dysuria.  Some increased frequency.  No nausea or vomiting.  No fevers or chills.  Pain is mild.  Worse at night.  No other obvious aggravating or alleviating factors.  ROS: Per HPI  PMH: She reports that she quit smoking about 30 years ago. She has never used smokeless tobacco. She reports current alcohol use of about 8.0 - 10.0 standard drinks of alcohol per week. She reports that she does not use drugs.      Objective:  Physical Exam: BP 124/62   Pulse 78   Temp 98.3 F (36.8 C)   Ht 5\' 6"  (1.676 m)   Wt 162 lb 6.1 oz (73.7 kg)   SpO2 99%   BMI 26.21 kg/m   Wt Readings from Last 3 Encounters:  07/23/19 162 lb 6.1 oz (73.7 kg)  05/11/19 159 lb (72.1 kg)  05/04/19 159 lb 4 oz (72.2 kg)    Gen: NAD, resting comfortably CV: Regular rate and rhythm with no murmurs appreciated Pulm: Normal work of breathing, clear to auscultation bilaterally with no crackles, wheezes, or rhonchi GI: Normal bowel sounds present. Soft, Nontender, Nondistended. MSK: No CVA tenderness.  Results for  orders placed or performed in visit on 07/23/19 (from the past 24 hour(s))  POCT Urinalysis Dipstick (Automated)     Status: Abnormal   Collection Time: 07/23/19 11:04 AM  Result Value Ref Range   Color, UA Yellow    Clarity, UA Clear    Glucose, UA Negative Negative   Bilirubin, UA Negative    Ketones, UA Negative    Spec Grav, UA 1.015 1.010 - 1.025   Blood, UA Positive    pH, UA 6.0 5.0 - 8.0   Protein, UA Negative Negative   Urobilinogen, UA 0.2 0.2 or 1.0 E.U./dL   Nitrite, UA Negative    Leukocytes, UA Trace (A) Negative        Roshawnda Pecora M. Jerline Pain, MD 07/23/2019 11:14 AM

## 2019-07-24 LAB — URINE CULTURE
MICRO NUMBER:: 1209316
SPECIMEN QUALITY:: ADEQUATE

## 2019-07-27 NOTE — Progress Notes (Signed)
Please inform patient of the following:  Urine culture is NEGATIVE - she does not have a UTI and does not need to start abx. Recommend she continuing use flomax and let us know if her pain is not improving.

## 2019-08-19 NOTE — Progress Notes (Signed)
Cardiology Office Note:    Date:  08/20/2019   ID:  Ashley Dyer, DOB 01/26/1946, MRN [id number]  PCP:  Ashley Barrack, MD  Cardiologist:  Mertie Moores, MD , previouis Ashley Dyer patient  Electrophysiologist:  None   Referring MD: Ashley Barrack, MD   No chief complaint on file.   February 16, 2019     Ashley Dyer is a 74 y.o. female with a hx of HTN, hyperlipidemia, asthma.  She is been followed by Dr. Wynonia Dyer in the past.  She was seen in January 05, 2017 by Beckie Busing, nurse practitioner for preoperative evaluation prior to shoulder surgery.  She has been intolerant to statin medications.  She is been on Praluent with good results. Still having leg cramps .   Is on a diuretic .  Recent left shoulder surgery on January 22, 2019 .  To start rehab in several weeks .   Exercises regularly   August 20, 2019:  Ashley Dyer is seen today for follow-up of her hyperlipidemia and hypertension. Feeling well Left hip has healed.  Getting back into exercise .  She is doing well on her Repatha.  She needs a refill.  She is a transfer from Dr. Wynonia Dyer and has not been seen in our lipid clinic yet.  We will make an appointment for her to be in our lipid clinic and get that refilled.   Past Medical History:  Diagnosis Date  . Allergy   . Anxiety    ED- visit for anxiety  . Arthritis    OA- shoulder(both) , hands  . Asthma   . Complication of anesthesia    fear- of feeling paralyzed - 1980's   . History of kidney stones 1980's   surgery  . Hyperlipidemia   . Hypertension   . 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 Walking pneumonia     Past Surgical History:  Procedure Laterality Date  . CATARACT EXTRACTION Bilateral   . KIDNEY STONE SURGERY    . TOTAL HIP ARTHROPLASTY Left 05/11/2019   Procedure: LEFT TOTAL HIP ARTHROPLASTY ANTERIOR APPROACH;  Surgeon: Frederik Pear, MD;  Location: [facility];  Service: Orthopedics;  Laterality: Left;  . TOTAL SHOULDER ARTHROPLASTY Right  06/13/2017  . TOTAL SHOULDER ARTHROPLASTY Right 06/13/2017   Procedure: TOTAL SHOULDER ARTHROPLASTY;  Surgeon: Tania Ade, MD;  Location: [place];  Service: Orthopedics;  Laterality: Right;  Right total shoulder arthroplasty  . TOTAL SHOULDER ARTHROPLASTY Left 01/22/2019   Procedure: TOTAL SHOULDER ARTHROPLASTY;  Surgeon: Tania Ade, MD;  Location: [facility];  Service: Orthopedics;  Laterality: Left;  Ashley Dyer VAGINAL DELIVERY  1976    Current Medications: Current Meds  Medication Sig  . Alirocumab (PRALUENT) 75 MG/ML SOAJ Inject 75 mg as directed every 14 (fourteen) days.  . Ascorbic Acid (VITAMIN C) 1000 MG tablet Take 1,000 mg by mouth daily.  Ashley Dyer CALCIUM CITRATE PO Take 1 tablet by mouth daily.   . cholecalciferol (VITAMIN D) 25 MCG (1000 UT) tablet Take 1,000 Units by mouth 2 (two) times daily.  . cromolyn (NASALCROM) 5.2 MG/ACT nasal spray Place 1 spray into both nostrils 2 (two) times daily.   Ashley Dyer estradiol (ESTRACE) 0.1 MG/GM vaginal cream Place 1 g vaginally as directed.  . tamsulosin (FLOMAX) 0.4 MG CAPS capsule Take 1 capsule (0.4 mg total) by mouth daily.  Ashley Dyer triamterene-hydrochlorothiazide (MAXZIDE) 75-50 MG tablet Take 1 tablet by mouth every other day alternating with 1/2 tablet the other days  . [DISCONTINUED] Alirocumab (PRALUENT) 75  MG/ML SOAJ Inject 75 mg as directed every 14 (fourteen) days.  . [DISCONTINUED] Alirocumab (PRALUENT) 75 MG/ML SOAJ Inject 75 mg as directed every 14 (fourteen) days.     Allergies:   Patient has no known allergies.   Social History   Socioeconomic History  . Marital status: Single    Spouse name: Not on file  . Number of children: Not on file  . Years of education: Not on file  . Highest education level: Not on file  Occupational History  . Not on file  Tobacco Use  . Smoking status: Former Smoker    Quit date: 1990    Years since quitting: 31.0  . Smokeless tobacco: Never Used  Substance and Sexual Activity  . Alcohol use: Yes     Alcohol/week: 8.0 - 10.0 standard drinks    Types: 8 - 10 Glasses of wine per week  . Drug use: No  . Sexual activity: Not Currently  Other Topics Concern  . Not on file  Social History Narrative  . Not on file   Social Determinants of Health   Financial Resource Strain:   . Difficulty of Paying Living Expenses: Not on file  Food Insecurity:   . Worried About Charity fundraiser in the Last Year: Not on file  . Ran Out of Food in the Last Year: Not on file  Transportation Needs:   . Lack of Transportation (Medical): Not on file  . Lack of Transportation (Non-Medical): Not on file  Physical Activity:   . Days of Exercise per Week: Not on file  . Minutes of Exercise per Session: Not on file  Stress:   . Feeling of Stress : Not on file  Social Connections:   . Frequency of Communication with Friends and Family: Not on file  . Frequency of Social Gatherings with Friends and Family: Not on file  . Attends Religious Services: Not on file  . Active Member of Clubs or Organizations: Not on file  . Attends Archivist Meetings: Not on file  . Marital Status: Not on file     Family History: The patient's family history includes Atrial fibrillation in her mother; Colon cancer in her paternal grandfather; Heart disease in her father; Leukemia in her brother. There is no history of Breast cancer.  ROS:   Please see the history of present illness.     All other systems reviewed and are negative.  EKGs/Labs/Other Studies Reviewed:    The following studies were reviewed today:   EKG:     Recent Labs: 05/12/2019: Hemoglobin 11.1; Platelets 179 08/20/2019: ALT 19; BUN 25; Creatinine, Ser 0.75; Potassium 3.6; Sodium 141  Recent Lipid Panel    Component Value Date/Time   CHOL 180 08/20/2019 1030   TRIG 107 08/20/2019 1030   HDL 84 08/20/2019 1030   CHOLHDL 2.1 08/20/2019 1030   LDLCALC 77 08/20/2019 1030    Physical Exam:    Physical Exam: Blood pressure 122/68,  pulse 73, height 5\' 6"  (1.676 m), weight 162 lb 12.8 oz (73.8 kg), SpO2 99 %.  GEN:  Well nourished, well developed in no acute distress HEENT: Normal NECK: No JVD; No carotid bruits LYMPHATICS: No lymphadenopathy CARDIAC: RRR , no murmurs, rubs, gallops RESPIRATORY:  Clear to auscultation without rales, wheezing or rhonchi  ABDOMEN: Soft, non-tender, non-distended MUSCULOSKELETAL:  No edema; No deformity  SKIN: Warm and dry NEUROLOGIC:  Alert and oriented x 3   ASSESSMENT:    1. Essential hypertension  2. Dyslipidemia   3. Coronary artery disease involving native coronary artery of native heart without angina pectoris    PLAN:    In order of problems listed above:  1. Hyperlipidemia: We will continue Praluent.  Her last labs in July look okay.  We will plan on checking lipids, liver enzymes, basic metabolic profile today.   2.  Hypertension: Blood pressure is well controlled.  Continue current medications.  Remains very active and healthy.  3.  CAD : She had a coronary calcium score under Dr. Wynonia Dyer.  Her coronary calcium score is 559 which places her in the 92nd percentile for age and gender matched controls.  We will continue aggressive lipid-lowering.  We will have her see our lipid clinic.  We will have her see an APP in 1 year.  Medication Adjustments/Labs and Tests Ordered: Current medicines are reviewed at length with the patient today.  Concerns regarding medicines are outlined above.  Orders Placed This Encounter  Procedures  . Lipid Profile  . Basic Metabolic Panel (BMET)  . Hepatic function panel   Meds ordered this encounter  Medications  . DISCONTD: Alirocumab (PRALUENT) 75 MG/ML SOAJ    Sig: Inject 75 mg as directed every 14 (fourteen) days.    Dispense:  2 mL    Refill:  11  . Alirocumab (PRALUENT) 75 MG/ML SOAJ    Sig: Inject 75 mg as directed every 14 (fourteen) days.    Dispense:  2 mL    Refill:  11    Patient Instructions  Medication  Instructions:  Your physician recommends that you continue on your current medications as directed. Please refer to the Current Medication list given to you today. A refill of your Praluent was sent to your pharmacy - call us if you have any problems with insurance *If you need a refill on your cardiac medications before your next appointment, please call your pharmacy*  Lab Work: TODAY - cholesterol, liver panel, basic metabolic panel If you have labs (blood work) drawn today and your tests are completely normal, you will receive your results only by: Ashley Dyer MyChart Message (if you have MyChart) OR . A paper copy in the mail If you have any lab test that is abnormal or we need to change your treatment, we will call you to review the results.   Testing/Procedures: None Ordered   Follow-Up: At [facility], you and your health needs are our priority.  As part of our continuing mission to provide you with exceptional heart care, we have created designated Provider Care Teams.  These Care Teams include your primary Cardiologist (physician) and Advanced Practice Providers (APPs -  Physician Assistants and Nurse Practitioners) who all work together to provide you with the care you need, when you need it.  Your next appointment:   1 year(s)  The format for your next appointment:   In Person  Provider:   Richardson Dopp, PA-C, Robbie Lis, PA-C or Daune Perch, NP      Signed, Mertie Moores, MD  08/20/2019 6:10 PM    [place]

## 2019-08-20 ENCOUNTER — Encounter: Payer: Self-pay | Admitting: Cardiovascular Disease

## 2019-08-20 ENCOUNTER — Telehealth: Payer: Self-pay | Admitting: *Deleted

## 2019-08-20 ENCOUNTER — Ambulatory Visit: Payer: Medicare Other | Admitting: Cardiovascular Disease

## 2019-08-20 ENCOUNTER — Other Ambulatory Visit: Payer: Self-pay

## 2019-08-20 VITALS — BP 122/68 | HR 73 | Ht 66.0 in | Wt 162.8 lb

## 2019-08-20 DIAGNOSIS — I10 Essential (primary) hypertension: Secondary | ICD-10-CM

## 2019-08-20 DIAGNOSIS — E785 Hyperlipidemia, unspecified: Secondary | ICD-10-CM

## 2019-08-20 DIAGNOSIS — T466X5A Adverse effect of antihyperlipidemic and antiarteriosclerotic drugs, initial encounter: Secondary | ICD-10-CM

## 2019-08-20 DIAGNOSIS — G720 Drug-induced myopathy: Secondary | ICD-10-CM

## 2019-08-20 DIAGNOSIS — I2510 Atherosclerotic heart disease of native coronary artery without angina pectoris: Secondary | ICD-10-CM | POA: Diagnosis not present

## 2019-08-20 LAB — LIPID PANEL
Chol/HDL Ratio: 2.1 ratio (ref 0.0–4.4)
Cholesterol, Total: 180 mg/dL (ref 100–199)
HDL: 84 mg/dL (ref >39)
LDL Chol Calc (NIH): 77 mg/dL (ref 0–99)
Triglycerides: 107 mg/dL (ref 0–149)
VLDL Cholesterol Cal: 19 mg/dL (ref 5–40)

## 2019-08-20 LAB — BASIC METABOLIC PANEL
BUN/Creatinine Ratio: 33 — ABNORMAL HIGH (ref 12–28)
BUN: 25 mg/dL (ref 8–27)
CO2: 24 mmol/L (ref 20–29)
Calcium: 9.6 mg/dL (ref 8.7–10.3)
Chloride: 102 mmol/L (ref 96–106)
Creatinine, Ser: 0.75 mg/dL (ref 0.57–1.00)
GFR calc Af Amer: 91 mL/min/{1.73_m2} (ref >59)
GFR calc non Af Amer: 79 mL/min/{1.73_m2} (ref >59)
Glucose: 107 mg/dL — ABNORMAL HIGH (ref 65–99)
Potassium: 3.6 mmol/L (ref 3.5–5.2)
Sodium: 141 mmol/L (ref 134–144)

## 2019-08-20 LAB — HEPATIC FUNCTION PANEL
ALT: 19 IU/L (ref 0–32)
AST: 27 IU/L (ref 0–40)
Albumin: 4.5 g/dL (ref 3.7–4.7)
Alkaline Phosphatase: 104 IU/L (ref 39–117)
Bilirubin Total: 0.4 mg/dL (ref 0.0–1.2)
Bilirubin, Direct: 0.1 mg/dL (ref 0.00–0.40)
Total Protein: 7.1 g/dL (ref 6.0–8.5)

## 2019-08-20 MED ORDER — PRALUENT 75 MG/ML ~~LOC~~ SOAJ: 75.0000 mg | SUBCUTANEOUS | 11 refills | Status: DC

## 2019-08-20 NOTE — Patient Instructions (Signed)
Medication Instructions:  Your physician recommends that you continue on your current medications as directed. Please refer to the Current Medication list given to you today. A refill of your Praluent was sent to your pharmacy - call us if you have any problems with insurance *If you need a refill on your cardiac medications before your next appointment, please call your pharmacy*  Lab Work: TODAY - cholesterol, liver panel, basic metabolic panel If you have labs (blood work) drawn today and your tests are completely normal, you will receive your results only by: Marland Kitchen MyChart Message (if you have MyChart) OR . A paper copy in the mail If you have any lab test that is abnormal or we need to change your treatment, we will call you to review the results.   Testing/Procedures: None Ordered   Follow-Up: At [facility], you and your health needs are our priority.  As part of our continuing mission to provide you with exceptional heart care, we have created designated Provider Care Teams.  These Care Teams include your primary Cardiologist (physician) and Advanced Practice Providers (APPs -  Physician Assistants and Nurse Practitioners) who all work together to provide you with the care you need, when you need it.  Your next appointment:   1 year(s)  The format for your next appointment:   In Person  Provider:   Richardson Dopp, PA-C, Robbie Lis, PA-C or Daune Perch, NP

## 2019-08-20 NOTE — Telephone Encounter (Signed)
Need PA for Praluent 75mg ,  Inject 75 mg (1 pen) as directed every 14 days

## 2019-08-20 NOTE — Telephone Encounter (Signed)
Already submitted and approved, have faxed pharmacy that they can fill rx.

## 2019-09-08 ENCOUNTER — Encounter: Payer: Self-pay | Admitting: Family Medicine

## 2019-09-08 ENCOUNTER — Ambulatory Visit (INDEPENDENT_AMBULATORY_CARE_PROVIDER_SITE_OTHER): Payer: Medicare Other | Admitting: Family Medicine

## 2019-09-08 VITALS — BP 138/77 | HR 74 | Temp 97.2°F | Ht 66.0 in

## 2019-09-08 DIAGNOSIS — R109 Unspecified abdominal pain: Secondary | ICD-10-CM

## 2019-09-08 DIAGNOSIS — N200 Calculus of kidney: Secondary | ICD-10-CM | POA: Insufficient documentation

## 2019-09-08 LAB — POC URINALSYSI DIPSTICK (AUTOMATED)
Bilirubin, UA: NEGATIVE
Blood, UA: POSITIVE
Glucose, UA: NEGATIVE
Ketones, UA: NEGATIVE
Nitrite, UA: NEGATIVE
Protein, UA: NEGATIVE
Spec Grav, UA: 1.01 (ref 1.010–1.025)
Urobilinogen, UA: 0.2 E.U./dL
pH, UA: 6 (ref 5.0–8.0)

## 2019-09-08 NOTE — Progress Notes (Signed)
   Isidora Starlynn Ermel is a 74 y.o. female who presents today for an office visit.  Assessment/Plan:  Chronic Problems Addressed Today: Recurrent nephrolithiasis UA with hematuria today.  Recent BMET within normal limits.  Given persistence of symptoms and prolonged hematuria, we will check a CT scan to further evaluate for nephrolithiasis.  Depending on results of CT scan may need referral to urology.    Subjective:  HPI:  Patient seen about 6 weeks ago for abdominal pain and hematuria.  Was started on Flomax.  Symptoms have not significantly changed since then.  Still has pain in her right lower quadrant.  Has not noticed any additional blood.  She is planning on a vacation to the beach in a couple of weeks for her birthday when she would like to make sure that it is safe to travel.  She has been straining her urine has not noticed any stones.       Objective:  Physical Exam: BP 138/77   Pulse 74   Temp (!) 97.2 F (36.2 C)   Ht 5\' 6"  (1.676 m)   SpO2 98%   BMI 26.28 kg/m   Gen: No acute distress, resting comfortably Psych: Normal affect and thought content      Kortlynn Poust M. Jerline Pain, MD 09/08/2019 12:29 PM

## 2019-09-08 NOTE — Assessment & Plan Note (Signed)
UA with hematuria today.  Recent BMET within normal limits.  Given persistence of symptoms and prolonged hematuria, we will check a CT scan to further evaluate for nephrolithiasis.  Depending on results of CT scan may need referral to urology.

## 2019-09-10 ENCOUNTER — Ambulatory Visit (INDEPENDENT_AMBULATORY_CARE_PROVIDER_SITE_OTHER)
Admission: RE | Admit: 2019-09-10 | Discharge: 2019-09-10 | Disposition: A | Discharge status: Home / Self Care | Payer: Medicare Other | Source: Ambulatory Visit | Attending: Family Medicine | Admitting: Family Medicine

## 2019-09-10 ENCOUNTER — Other Ambulatory Visit: Payer: Self-pay

## 2019-09-10 ENCOUNTER — Encounter: Payer: Self-pay | Admitting: Family Medicine

## 2019-09-10 DIAGNOSIS — R109 Unspecified abdominal pain: Secondary | ICD-10-CM | POA: Diagnosis not present

## 2019-09-11 ENCOUNTER — Other Ambulatory Visit: Payer: Self-pay

## 2019-09-11 DIAGNOSIS — N200 Calculus of kidney: Secondary | ICD-10-CM

## 2019-09-11 NOTE — Telephone Encounter (Signed)
Please Advise

## 2019-09-11 NOTE — Progress Notes (Signed)
Please inform patient of the following:  She has a large 15x2mm stone in her right kidney but no signs of blockages. This is not an emergent issue but I think we probably need to have her see the urologist again soon. Please place referral if patient is willing to go.  Ashley Dyer. Jerline Pain, MD 09/11/2019 1:02 PM

## 2019-09-14 ENCOUNTER — Encounter: Payer: Self-pay | Admitting: Family Medicine

## 2019-09-14 NOTE — Telephone Encounter (Signed)
Notified patient of results 

## 2019-09-16 DIAGNOSIS — N200 Calculus of kidney: Secondary | ICD-10-CM | POA: Diagnosis not present

## 2019-09-20 ENCOUNTER — Ambulatory Visit: Payer: Medicare Other | Attending: Internal Medicine

## 2019-09-20 DIAGNOSIS — Z23 Encounter for immunization: Secondary | ICD-10-CM

## 2019-09-20 NOTE — Progress Notes (Signed)
   Covid-19 Vaccination Clinic  Name:  Ashley Dyer    MRN: [id number] DOB: Nov 21, 1945  09/20/2019  [name] was observed post Covid-19 immunization for 15 minutes without incidence. She was provided with Vaccine Information Sheet and instruction to access the V-Safe system.   [name] was instructed to call 911 with any severe reactions post vaccine: Marland Kitchen Difficulty breathing  . Swelling of your face and throat  . A fast heartbeat  . A bad rash all over your body  . Dizziness and weakness    Immunizations Administered    Name Date Dose VIS Date Route   Pfizer COVID-19 Vaccine 09/20/2019  2:04 PM 0.3 mL 07/17/2019 Intramuscular   Manufacturer: [place]   Lot: [id number]   [place]: [id number]

## 2019-09-30 ENCOUNTER — Other Ambulatory Visit: Payer: Self-pay | Admitting: Urology

## 2019-10-01 DIAGNOSIS — T1511XA Foreign body in conjunctival sac, right eye, initial encounter: Secondary | ICD-10-CM | POA: Diagnosis not present

## 2019-10-13 ENCOUNTER — Ambulatory Visit: Payer: Medicare Other | Attending: Internal Medicine

## 2019-10-13 DIAGNOSIS — Z23 Encounter for immunization: Secondary | ICD-10-CM | POA: Insufficient documentation

## 2019-10-13 NOTE — Patient Instructions (Addendum)
DUE TO COVID-19 ONLY ONE VISITOR IS ALLOWED TO COME WITH YOU AND STAY IN THE WAITING ROOM ONLY DURING PRE OP AND PROCEDURE DAY OF SURGERY. THE 1 VISITOR MAY VISIT WITH YOU AFTER SURGERY IN YOUR PRIVATE ROOM DURING VISITING HOURS ONLY!  YOU NEED TO HAVE A COVID 19 TEST ON 10-19-19 @ 3:00 PM, THIS TEST MUST BE DONE BEFORE SURGERY, COME  [place], [place] , [address].  ([place]) ONCE YOUR COVID TEST IS COMPLETED, PLEASE BEGIN THE QUARANTINE INSTRUCTIONS AS OUTLINED IN YOUR HANDOUT.                Ashley Dyer  10/13/2019   Your procedure is scheduled on:    Report to [facility] Main  Entrance    Report to Short Stay at 5:30 AM     Call this number if you have problems the morning of surgery [phone]    Remember: Do not eat food or drink liquids :After Midnight.     Take these medicines the morning of surgery with A SIP OF WATER: Tramadol, prn   BRUSH YOUR TEETH MORNING OF SURGERY AND RINSE YOUR MOUTH OUT, NO CHEWING GUM CANDY OR MINTS.                                You may not have any metal on your body including hair pins and              piercings     Do not wear jewelry, cologne, lotions, powders or deodorant                          Men may shave face and neck.   Do not bring valuables to the hospital. [place].  Contacts, dentures or bridgework may not be worn into surgery.  You may bring an overnight bag    Special Instructions: N/A              Please read over the following fact sheets you were given: _____________________________________________________________________             [facility] - Preparing for Surgery Before surgery, you can play an important role.  Because skin is not sterile, your skin needs to be as free of germs as possible.  You can reduce the number of germs on your skin by washing with CHG (chlorahexidine gluconate) soap before surgery.  CHG  is an antiseptic cleaner which kills germs and bonds with the skin to continue killing germs even after washing. Please DO NOT use if you have an allergy to CHG or antibacterial soaps.  If your skin becomes reddened/irritated stop using the CHG and inform your nurse when you arrive at Short Stay. Do not shave (including legs and underarms) for at least 48 hours prior to the first CHG shower.  You may shave your face/neck. Please follow these instructions carefully:  1.  Shower with CHG Soap the night before surgery and the  morning of Surgery.  2.  If you choose to wash your hair, wash your hair first as usual with your  normal  shampoo.  3.  After you shampoo, rinse your hair and body thoroughly to remove the  shampoo.  4.  Use CHG as you would any other liquid soap.  You can apply chg directly  to the skin and wash                       Gently with a scrungie or clean washcloth.  5.  Apply the CHG Soap to your body ONLY FROM THE NECK DOWN.   Do not use on face/ open                           Wound or open sores. Avoid contact with eyes, ears mouth and genitals (private parts).                       Wash face,  Genitals (private parts) with your normal soap.             6.  Wash thoroughly, paying special attention to the area where your surgery  will be performed.  7.  Thoroughly rinse your body with warm water from the neck down.  8.  DO NOT shower/wash with your normal soap after using and rinsing off  the CHG Soap.                9.  Pat yourself dry with a clean towel.            10.  Wear clean pajamas.            11.  Place clean sheets on your bed the night of your first shower and do not  sleep with pets. Day of Surgery : Do not apply any lotions/deodorants the morning of surgery.  Please wear clean clothes to the hospital/surgery center.  FAILURE TO FOLLOW THESE INSTRUCTIONS MAY RESULT IN THE CANCELLATION OF YOUR SURGERY PATIENT  SIGNATURE_________________________________  NURSE SIGNATURE__________________________________  ________________________________________________________________________

## 2019-10-13 NOTE — Progress Notes (Signed)
PCP - Dimas Chyle  Cardiologist - Mertie Moores w/LOV 08-20-19 f/u in 56yr  Chest x-ray -  EKG - 01-10-19 Stress Test -  ECHO -  Cardiac Cath -   Sleep Study -  CPAP -   Fasting Blood Sugar -  Checks Blood Sugar _____ times a day  Blood Thinner Instructions: Aspirin Instructions: Last Dose:  Anesthesia review:   Patient denies shortness of breath, fever, cough and chest pain at PAT appointment   Patient verbalized understanding of instructions that were given to them at the PAT appointment. Patient was also instructed that they will need to review over the PAT instructions again at home before surgery.

## 2019-10-13 NOTE — Progress Notes (Signed)
   Covid-19 Vaccination Clinic  Name:  Ashley Dyer    MRN: [id number] DOB: 19-Aug-1945  10/13/2019  Ashley Dyer was observed post Covid-19 immunization for 15 minutes without incident. She was provided with Vaccine Information Sheet and instruction to access the V-Safe system.   Ashley Dyer was instructed to call 911 with any severe reactions post vaccine: Marland Kitchen Difficulty breathing  . Swelling of face and throat  . A fast heartbeat  . A bad rash all over body  . Dizziness and weakness   Immunizations Administered    Name Date Dose VIS Date Route   Pfizer COVID-19 Vaccine 10/13/2019 12:18 PM 0.3 mL 07/17/2019 Intramuscular   Manufacturer: [place]   Lot: [id number]   [place]: [id number]

## 2019-10-14 ENCOUNTER — Ambulatory Visit: Payer: Medicare Other

## 2019-10-15 ENCOUNTER — Encounter (HOSPITAL_COMMUNITY): Payer: Self-pay

## 2019-10-15 ENCOUNTER — Other Ambulatory Visit: Payer: Self-pay

## 2019-10-15 ENCOUNTER — Encounter (HOSPITAL_COMMUNITY)
Admission: RE | Admit: 2019-10-15 | Discharge: 2019-10-15 | Disposition: A | Discharge status: Home / Self Care | Payer: Medicare Other | Source: Ambulatory Visit | Attending: Urology | Admitting: Urology

## 2019-10-15 HISTORY — DX: Pneumonia, unspecified organism: J18.9

## 2019-10-16 ENCOUNTER — Encounter (HOSPITAL_COMMUNITY)
Admission: RE | Admit: 2019-10-16 | Discharge: 2019-10-16 | Disposition: A | Discharge status: Home / Self Care | Payer: Medicare Other | Source: Ambulatory Visit | Attending: Urology | Admitting: Urology

## 2019-10-16 DIAGNOSIS — Z87891 Personal history of nicotine dependence: Secondary | ICD-10-CM | POA: Insufficient documentation

## 2019-10-16 DIAGNOSIS — Z01812 Encounter for preprocedural laboratory examination: Secondary | ICD-10-CM | POA: Diagnosis not present

## 2019-10-16 DIAGNOSIS — N200 Calculus of kidney: Secondary | ICD-10-CM | POA: Diagnosis not present

## 2019-10-16 DIAGNOSIS — I10 Essential (primary) hypertension: Secondary | ICD-10-CM | POA: Insufficient documentation

## 2019-10-16 DIAGNOSIS — Z79899 Other long term (current) drug therapy: Secondary | ICD-10-CM | POA: Diagnosis not present

## 2019-10-16 LAB — COMPREHENSIVE METABOLIC PANEL
ALT: 22 U/L (ref 0–44)
AST: 28 U/L (ref 15–41)
Albumin: 4.1 g/dL (ref 3.5–5.0)
Alkaline Phosphatase: 74 U/L (ref 38–126)
Anion gap: 9 (ref 5–15)
BUN: 20 mg/dL (ref 8–23)
CO2: 27 mmol/L (ref 22–32)
Calcium: 9.1 mg/dL (ref 8.9–10.3)
Chloride: 105 mmol/L (ref 98–111)
Creatinine, Ser: 0.58 mg/dL (ref 0.44–1.00)
GFR calc Af Amer: >60 mL/min (ref >60)
GFR calc non Af Amer: >60 mL/min (ref >60)
Glucose, Bld: 100 mg/dL — ABNORMAL HIGH (ref 70–99)
Potassium: 3.7 mmol/L (ref 3.5–5.1)
Sodium: 141 mmol/L (ref 135–145)
Total Bilirubin: 0.7 mg/dL (ref 0.3–1.2)
Total Protein: 7 g/dL (ref 6.5–8.1)

## 2019-10-16 LAB — CBC
HCT: 41.6 % (ref 36.0–46.0)
Hemoglobin: 13.6 g/dL (ref 12.0–15.0)
MCH: 30.2 pg (ref 26.0–34.0)
MCHC: 32.7 g/dL (ref 30.0–36.0)
MCV: 92.2 fL (ref 80.0–100.0)
Platelets: 216 10*3/uL (ref 150–400)
RBC: 4.51 MIL/uL (ref 3.87–5.11)
RDW: 13.8 % (ref 11.5–15.5)
WBC: 4.9 10*3/uL (ref 4.0–10.5)
nRBC: 0 % (ref 0.0–0.2)

## 2019-10-17 LAB — URINE CULTURE: Culture: <10000 — AB

## 2019-10-18 ENCOUNTER — Other Ambulatory Visit: Payer: Self-pay | Admitting: Family Medicine

## 2019-10-19 ENCOUNTER — Other Ambulatory Visit (HOSPITAL_COMMUNITY)
Admission: RE | Admit: 2019-10-19 | Discharge: 2019-10-19 | Disposition: A | Discharge status: Home / Self Care | Payer: Medicare Other | Source: Ambulatory Visit | Attending: Urology | Admitting: Urology

## 2019-10-19 DIAGNOSIS — Z01812 Encounter for preprocedural laboratory examination: Secondary | ICD-10-CM | POA: Diagnosis not present

## 2019-10-19 DIAGNOSIS — Z20822 Contact with and (suspected) exposure to covid-19: Secondary | ICD-10-CM | POA: Insufficient documentation

## 2019-10-19 NOTE — Progress Notes (Signed)
Urine culture results routed to Dr. Louis Meckel for review.

## 2019-10-20 LAB — SARS CORONAVIRUS 2 (TAT 6-24 HRS): SARS Coronavirus 2: NEGATIVE

## 2019-10-21 MED ORDER — GENTAMICIN SULFATE 40 MG/ML IJ SOLN
5.0000 mg/kg | INTRAVENOUS | Status: AC
  Administered 2019-10-22: 370 mg via INTRAVENOUS
  Filled 2019-10-21: qty 9.25

## 2019-10-21 NOTE — Anesthesia Preprocedure Evaluation (Signed)
Anesthesia Evaluation  Patient identified by MRN, date of birth, ID band Patient awake    Reviewed: Allergy & Precautions, NPO status , Patient's Chart, lab work & pertinent test results  Airway Mallampati: II  TM Distance: >3 FB Neck ROM: Full    Dental no notable dental hx.    Pulmonary asthma , pneumonia, former smoker,    Pulmonary exam normal breath sounds clear to auscultation       Cardiovascular hypertension, Normal cardiovascular exam Rhythm:Regular Rate:Normal     Neuro/Psych PSYCHIATRIC DISORDERS Anxiety negative neurological ROS     GI/Hepatic negative GI ROS, Neg liver ROS,   Endo/Other  negative endocrine ROS  Renal/GU Renal disease     Musculoskeletal  (+) Arthritis ,   Abdominal   Peds  Hematology negative hematology ROS (+)   Anesthesia Other Findings   Reproductive/Obstetrics negative OB ROS                             Anesthesia Physical  Anesthesia Plan  ASA: II  Anesthesia Plan: General   Post-op Pain Management:    Induction: Intravenous  PONV Risk Score and Plan: 4 or greater and Ondansetron, Dexamethasone and Treatment may vary due to age or medical condition  Airway Management Planned: Oral ETT  Additional Equipment: None  Intra-op Plan:   Post-operative Plan: Extubation in OR  Informed Consent: I have reviewed the patients History and Physical, chart, labs and discussed the procedure including the risks, benefits and alternatives for the proposed anesthesia with the patient or authorized representative who has indicated his/her understanding and acceptance.     Dental advisory given  Plan Discussed with: CRNA  Anesthesia Plan Comments:         Anesthesia Quick Evaluation

## 2019-10-22 ENCOUNTER — Encounter (HOSPITAL_COMMUNITY)
Admission: RE | Disposition: A | Discharge status: Home / Self Care | Payer: Self-pay | Source: Home / Self Care | Attending: Urology

## 2019-10-22 ENCOUNTER — Encounter (HOSPITAL_COMMUNITY): Payer: Self-pay | Admitting: Urology

## 2019-10-22 ENCOUNTER — Ambulatory Visit (HOSPITAL_COMMUNITY): Payer: Medicare Other

## 2019-10-22 ENCOUNTER — Ambulatory Visit (HOSPITAL_COMMUNITY): Payer: Medicare Other | Admitting: Certified Registered"

## 2019-10-22 ENCOUNTER — Observation Stay (HOSPITAL_COMMUNITY): Payer: Medicare Other

## 2019-10-22 ENCOUNTER — Other Ambulatory Visit: Payer: Self-pay

## 2019-10-22 ENCOUNTER — Observation Stay (HOSPITAL_COMMUNITY)
Admission: RE | Admit: 2019-10-22 | Discharge: 2019-10-23 | Disposition: A | Discharge status: Home / Self Care | Payer: Medicare Other | Attending: Urology | Admitting: Urology

## 2019-10-22 DIAGNOSIS — I10 Essential (primary) hypertension: Secondary | ICD-10-CM | POA: Diagnosis not present

## 2019-10-22 DIAGNOSIS — J4520 Mild intermittent asthma, uncomplicated: Secondary | ICD-10-CM | POA: Insufficient documentation

## 2019-10-22 DIAGNOSIS — Z8249 Family history of ischemic heart disease and other diseases of the circulatory system: Secondary | ICD-10-CM | POA: Insufficient documentation

## 2019-10-22 DIAGNOSIS — Z87442 Personal history of urinary calculi: Secondary | ICD-10-CM | POA: Insufficient documentation

## 2019-10-22 DIAGNOSIS — Z96642 Presence of left artificial hip joint: Secondary | ICD-10-CM | POA: Insufficient documentation

## 2019-10-22 DIAGNOSIS — Z96611 Presence of right artificial shoulder joint: Secondary | ICD-10-CM | POA: Insufficient documentation

## 2019-10-22 DIAGNOSIS — N200 Calculus of kidney: Principal | ICD-10-CM | POA: Insufficient documentation

## 2019-10-22 DIAGNOSIS — Z79899 Other long term (current) drug therapy: Secondary | ICD-10-CM | POA: Insufficient documentation

## 2019-10-22 DIAGNOSIS — K661 Hemoperitoneum: Secondary | ICD-10-CM | POA: Diagnosis not present

## 2019-10-22 DIAGNOSIS — E785 Hyperlipidemia, unspecified: Secondary | ICD-10-CM | POA: Insufficient documentation

## 2019-10-22 HISTORY — PX: NEPHROLITHOTOMY: SHX5134

## 2019-10-22 LAB — CBC
HCT: 38 % (ref 36.0–46.0)
Hemoglobin: 12.8 g/dL (ref 12.0–15.0)
MCH: 31.2 pg (ref 26.0–34.0)
MCHC: 33.7 g/dL (ref 30.0–36.0)
MCV: 92.7 fL (ref 80.0–100.0)
Platelets: 203 10*3/uL (ref 150–400)
RBC: 4.1 MIL/uL (ref 3.87–5.11)
RDW: 13.7 % (ref 11.5–15.5)
WBC: 10.9 10*3/uL — ABNORMAL HIGH (ref 4.0–10.5)
nRBC: 0 % (ref 0.0–0.2)

## 2019-10-22 LAB — TYPE AND SCREEN
ABO/RH(D): O POS
Antibody Screen: NEGATIVE

## 2019-10-22 SURGERY — NEPHROLITHOTOMY PERCUTANEOUS
Anesthesia: General | Laterality: Right

## 2019-10-22 MED ORDER — ROCURONIUM BROMIDE 10 MG/ML (PF) SYRINGE
PREFILLED_SYRINGE | INTRAVENOUS | Status: AC
  Filled 2019-10-22: qty 10

## 2019-10-22 MED ORDER — EPHEDRINE 5 MG/ML INJ
INTRAVENOUS | Status: AC
  Filled 2019-10-22: qty 10

## 2019-10-22 MED ORDER — BUPIVACAINE HCL (PF) 0.5 % IJ SOLN
INTRAMUSCULAR | Status: AC
  Filled 2019-10-22: qty 30

## 2019-10-22 MED ORDER — LACTATED RINGERS IV SOLN: INTRAVENOUS | Status: DC | PRN

## 2019-10-22 MED ORDER — PROMETHAZINE HCL 25 MG/ML IJ SOLN: 6.2500 mg | INTRAMUSCULAR | Status: DC | PRN

## 2019-10-22 MED ORDER — HYDROMORPHONE HCL 1 MG/ML IJ SOLN
INTRAMUSCULAR | Status: AC
  Filled 2019-10-22: qty 1

## 2019-10-22 MED ORDER — IOHEXOL 300 MG/ML  SOLN
INTRAMUSCULAR | Status: DC | PRN
  Administered 2019-10-22: 105 mL

## 2019-10-22 MED ORDER — HYDROMORPHONE HCL 1 MG/ML IJ SOLN
0.2500 mg | INTRAMUSCULAR | Status: DC | PRN
  Administered 2019-10-22 (×2): 0.5 mg via INTRAVENOUS

## 2019-10-22 MED ORDER — PROPOFOL 10 MG/ML IV BOLUS
INTRAVENOUS | Status: DC | PRN
  Administered 2019-10-22: 150 mg via INTRAVENOUS

## 2019-10-22 MED ORDER — PROPOFOL 10 MG/ML IV BOLUS
INTRAVENOUS | Status: AC
  Filled 2019-10-22: qty 20

## 2019-10-22 MED ORDER — PHENYLEPHRINE HCL (PRESSORS) 10 MG/ML IV SOLN
INTRAVENOUS | Status: AC
  Filled 2019-10-22: qty 1

## 2019-10-22 MED ORDER — TRAMADOL HCL 50 MG PO TABS: 50.0000 mg | ORAL_TABLET | Freq: Four times a day (QID) | ORAL | Status: DC | PRN

## 2019-10-22 MED ORDER — SUGAMMADEX SODIUM 200 MG/2ML IV SOLN
INTRAVENOUS | Status: DC | PRN
  Administered 2019-10-22: 200 mg via INTRAVENOUS

## 2019-10-22 MED ORDER — SODIUM CHLORIDE 0.9 % IR SOLN
Status: DC | PRN
  Administered 2019-10-22: 12000 mL

## 2019-10-22 MED ORDER — BUPIVACAINE HCL (PF) 0.5 % IJ SOLN
INTRAMUSCULAR | Status: DC | PRN
  Administered 2019-10-22: 30 mL

## 2019-10-22 MED ORDER — ROCURONIUM BROMIDE 10 MG/ML (PF) SYRINGE
PREFILLED_SYRINGE | INTRAVENOUS | Status: DC | PRN
  Administered 2019-10-22: 60 mg via INTRAVENOUS
  Administered 2019-10-22: 10 mg via INTRAVENOUS
  Administered 2019-10-22 (×2): 20 mg via INTRAVENOUS
  Administered 2019-10-22: 10 mg via INTRAVENOUS

## 2019-10-22 MED ORDER — PHENYLEPHRINE HCL-NACL 10-0.9 MG/250ML-% IV SOLN
INTRAVENOUS | Status: DC | PRN
  Administered 2019-10-22: 20 ug/min via INTRAVENOUS

## 2019-10-22 MED ORDER — ONDANSETRON HCL 4 MG/2ML IJ SOLN
INTRAMUSCULAR | Status: AC
  Filled 2019-10-22: qty 2

## 2019-10-22 MED ORDER — DEXAMETHASONE SODIUM PHOSPHATE 10 MG/ML IJ SOLN
INTRAMUSCULAR | Status: DC | PRN
  Administered 2019-10-22: 8 mg via INTRAVENOUS

## 2019-10-22 MED ORDER — FENTANYL CITRATE (PF) 250 MCG/5ML IJ SOLN
INTRAMUSCULAR | Status: DC | PRN
  Administered 2019-10-22 (×3): 50 ug via INTRAVENOUS

## 2019-10-22 MED ORDER — MEPERIDINE HCL 50 MG/ML IJ SOLN: 6.2500 mg | INTRAMUSCULAR | Status: DC | PRN

## 2019-10-22 MED ORDER — ACETAMINOPHEN 10 MG/ML IV SOLN
1000.0000 mg | Freq: Four times a day (QID) | INTRAVENOUS | Status: AC
  Administered 2019-10-22 – 2019-10-23 (×4): 1000 mg via INTRAVENOUS
  Filled 2019-10-22 (×4): qty 100

## 2019-10-22 MED ORDER — LIDOCAINE 2% (20 MG/ML) 5 ML SYRINGE
INTRAMUSCULAR | Status: DC | PRN
  Administered 2019-10-22: 40 mg via INTRAVENOUS

## 2019-10-22 MED ORDER — LACTATED RINGERS IV SOLN: INTRAVENOUS | Status: DC

## 2019-10-22 MED ORDER — FENTANYL CITRATE (PF) 100 MCG/2ML IJ SOLN
INTRAMUSCULAR | Status: AC
  Filled 2019-10-22: qty 2

## 2019-10-22 MED ORDER — CHLORHEXIDINE GLUCONATE CLOTH 2 % EX PADS
6.0000 | MEDICATED_PAD | Freq: Every day | CUTANEOUS | Status: DC
  Administered 2019-10-22: 6 via TOPICAL

## 2019-10-22 MED ORDER — LIDOCAINE 2% (20 MG/ML) 5 ML SYRINGE
INTRAMUSCULAR | Status: AC
  Filled 2019-10-22: qty 5

## 2019-10-22 MED ORDER — DEXAMETHASONE SODIUM PHOSPHATE 10 MG/ML IJ SOLN
INTRAMUSCULAR | Status: AC
  Filled 2019-10-22: qty 1

## 2019-10-22 MED ORDER — 0.9 % SODIUM CHLORIDE (POUR BTL) OPTIME
TOPICAL | Status: DC | PRN
  Administered 2019-10-22: 1000 mL

## 2019-10-22 MED ORDER — ONDANSETRON HCL 4 MG/2ML IJ SOLN
INTRAMUSCULAR | Status: DC | PRN
  Administered 2019-10-22: 4 mg via INTRAVENOUS

## 2019-10-22 MED ORDER — CEFAZOLIN SODIUM-DEXTROSE 2-4 GM/100ML-% IV SOLN
2.0000 g | INTRAVENOUS | Status: AC
  Administered 2019-10-22: 2 g via INTRAVENOUS
  Filled 2019-10-22: qty 100

## 2019-10-22 MED ORDER — EPHEDRINE SULFATE-NACL 50-0.9 MG/10ML-% IV SOSY
PREFILLED_SYRINGE | INTRAVENOUS | Status: DC | PRN
  Administered 2019-10-22 (×2): 5 mg via INTRAVENOUS
  Administered 2019-10-22: 10 mg via INTRAVENOUS
  Administered 2019-10-22: 5 mg via INTRAVENOUS

## 2019-10-22 MED ORDER — HYDROMORPHONE HCL 1 MG/ML IJ SOLN: 0.5000 mg | INTRAMUSCULAR | Status: DC | PRN

## 2019-10-22 SURGICAL SUPPLY — 52 items
BAG URINE DRAIN 2000ML AR STRL (UROLOGICAL SUPPLIES) IMPLANT
BASKET STONE NCOMPASS (UROLOGICAL SUPPLIES) IMPLANT
BASKET ZERO TIP NITINOL 2.4FR (BASKET) ×3 IMPLANT
BENZOIN TINCTURE PRP APPL 2/3 (GAUZE/BANDAGES/DRESSINGS) ×3 IMPLANT
BLADE SURG 15 STRL LF DISP TIS (BLADE) ×1 IMPLANT
BLADE SURG 15 STRL SS (BLADE) ×2
CATH AINSWORTH 30CC 24FR (CATHETERS) ×3 IMPLANT
CATH IMAGER II 65CM (CATHETERS) ×3 IMPLANT
CATH URET 5FR 28IN OPEN ENDED (CATHETERS) ×3 IMPLANT
CATH URET DUAL LUMEN 6-10FR 50 (CATHETERS) ×3 IMPLANT
CATH X-FORCE N30 NEPHROSTOMY (TUBING) ×3 IMPLANT
CHLORAPREP W/TINT 26 (MISCELLANEOUS) ×3 IMPLANT
COVER WAND RF STERILE (DRAPES) IMPLANT
DRAPE C-ARM 42X120 X-RAY (DRAPES) ×3 IMPLANT
DRAPE LINGEMAN PERC (DRAPES) ×3 IMPLANT
DRSG PAD ABDOMINAL 8X10 ST (GAUZE/BANDAGES/DRESSINGS) ×3 IMPLANT
DRSG TEGADERM 4X4.75 (GAUZE/BANDAGES/DRESSINGS) ×3 IMPLANT
DRSG TEGADERM 8X12 (GAUZE/BANDAGES/DRESSINGS) IMPLANT
EXTRACTOR STONE 1.7FRX115CM (UROLOGICAL SUPPLIES) IMPLANT
FIBER LASER FLEXIVA 365 (UROLOGICAL SUPPLIES) IMPLANT
FIBER LASER TRAC TIP (UROLOGICAL SUPPLIES) IMPLANT
GAUZE SPONGE 4X4 12PLY STRL (GAUZE/BANDAGES/DRESSINGS) ×3 IMPLANT
GLOVE BIOGEL M STRL SZ7.5 (GLOVE) ×3 IMPLANT
GOWN STRL REUS W/TWL XL LVL3 (GOWN DISPOSABLE) ×6 IMPLANT
GUIDEWIRE AMPLAZ .035X145 (WIRE) ×6 IMPLANT
GUIDEWIRE ANG ZIPWIRE 038X150 (WIRE) ×3 IMPLANT
GUIDEWIRE STR DUAL SENSOR (WIRE) ×6 IMPLANT
HOLDER NEEDLE AMPLATZ W/INSERT (MISCELLANEOUS) ×3 IMPLANT
IV SET EXTENSION CATH 6 NF (IV SETS) ×3 IMPLANT
KIT BASIN OR (CUSTOM PROCEDURE TRAY) ×3 IMPLANT
KIT PROBE 340X3.4XDISP GRN (MISCELLANEOUS) IMPLANT
KIT PROBE TRILOGY 3.4X340 (MISCELLANEOUS)
KIT PROBE TRILOGY 3.9X350 (MISCELLANEOUS) ×3 IMPLANT
KIT TURNOVER KIT A (KITS) IMPLANT
MANIFOLD NEPTUNE II (INSTRUMENTS) ×3 IMPLANT
NEEDLE SPNL 20GX3.5 QUINCKE YW (NEEDLE) ×3 IMPLANT
NEEDLE TROCAR 18X15 ECHO (NEEDLE) ×3 IMPLANT
NEEDLE TROCAR 18X20 (NEEDLE) IMPLANT
NS IRRIG 1000ML POUR BTL (IV SOLUTION) ×3 IMPLANT
PACK CYSTO (CUSTOM PROCEDURE TRAY) ×3 IMPLANT
SHEATH PEELAWAY SET 9 (SHEATH) IMPLANT
SPONGE LAP 4X18 RFD (DISPOSABLE) ×3 IMPLANT
STENT CONTOUR 6FRX26X.038 (STENTS) ×3 IMPLANT
SUT ETHILON 3 0 PS 1 (SUTURE) ×3 IMPLANT
SYR 10ML LL (SYRINGE) ×3 IMPLANT
SYR 20ML LL LF (SYRINGE) ×6 IMPLANT
SYR 50ML LL SCALE MARK (SYRINGE) ×3 IMPLANT
TOWEL OR 17X26 10 PK STRL BLUE (TOWEL DISPOSABLE) ×3 IMPLANT
TUBING CONNECTING 10 (TUBING) ×4 IMPLANT
TUBING CONNECTING 10' (TUBING) ×2
TUBING STONE CATCHER TRILOGY (MISCELLANEOUS) ×3 IMPLANT
TUBING UROLOGY SET (TUBING) ×3 IMPLANT

## 2019-10-22 NOTE — Anesthesia Procedure Notes (Signed)
Procedure Name: Intubation Date/Time: 10/22/2019 7:36 AM Performed by: Eben Burow, CRNA Pre-anesthesia Checklist: Patient identified, Emergency Drugs available, Suction available, Patient being monitored and Timeout performed Patient Re-evaluated:Patient Re-evaluated prior to induction Oxygen Delivery Method: Circle system utilized Preoxygenation: Pre-oxygenation with 100% oxygen Induction Type: IV induction Ventilation: Mask ventilation without difficulty Laryngoscope Size: Mac and 4 Grade View: Grade I Tube type: Oral Tube size: 7.0 mm Number of attempts: 1 Airway Equipment and Method: Stylet Placement Confirmation: ETT inserted through vocal cords under direct vision,  positive ETCO2 and breath sounds checked- equal and bilateral Secured at: 21 cm Tube secured with: Tape Dental Injury: Teeth and Oropharynx as per pre-operative assessment

## 2019-10-22 NOTE — Anesthesia Postprocedure Evaluation (Signed)
Anesthesia Post Note  Patient: Engineer, maintenance  Procedure(s) Performed: RIGHT NEPHROLITHOTOMY PERCUTANEOUS WITH SURGEON ACCESS (Right )     Patient location during evaluation: PACU Anesthesia Type: General Level of consciousness: sedated and patient cooperative Pain management: pain level controlled Vital Signs Assessment: post-procedure vital signs reviewed and stable Respiratory status: spontaneous breathing Cardiovascular status: stable Anesthetic complications: no    Last Vitals:  Vitals:   10/22/19 1602 10/22/19 2034  BP: 130/68 134/71  Pulse: 88 93  Resp: 18   Temp: 36.8 C 36.7 C  SpO2: 96% 96%    Last Pain:  Vitals:   10/22/19 2034  TempSrc: Oral  PainSc:                  Nolon Nations

## 2019-10-22 NOTE — H&P (Signed)
Acute Kidney Stone  HPI: Ashley Dyer is a 74 year-old female patient who is here for further eval and management of kidney stones.  She was diagnosed with a kidney stone on approximately 09/10/2019.   The pain is on the right side.   Abdomen/Pelvic CT: 2.3 cm branched lower pole right kidney stone. The patient underwent CT scan prior to today's appointment.   The patient relates initially having flank pain and groin pain. She is not currently having flank pain, back pain, groin pain, nausea, vomiting, fever or chills. She has not caught a stone in her urine strainer since her symptoms began.   This is not her first kidney stone. She has had 1 stones prior to getting this one.   Patient had CT scan for flank pain that radiates to her right groin. Sharp pain, intermittent, 5/10. Not associated with N/V. Lasts two days she hasn't had any pain.   History of open ureterolithotomy 30 years ago.   b/l shoulder and left hip repair in the last year.     ALLERGIES: None   MEDICATIONS: Tamsulosin Hcl 0.4 mg capsule  Ashwagandha Root Extract  Praluent Pen 75 mg/ml pen injector  Triamterene-Hydrochlorothiazid 75 mg-50 mg tablet  Vitamin C  Vitamin D3     GU PSH: Cysto Uretero Lithotripsy, Right       PSH Notes: B shoulder Replace    NON-GU PSH: Hip Replacement, Left Tonsillectomy     GU PMH: None   NON-GU PMH: Hypercholesterolemia Hypertension    FAMILY HISTORY: 1 Daughter - No Family History Heart Disease - Mother   SOCIAL HISTORY: Marital Status: Divorced Preferred Language: English; Race: White Current Smoking Status: Patient has never smoked.   Tobacco Use Assessment Completed: Used Tobacco in last 30 days? Drinks 4 drinks per week. Types of alcohol consumed: Wine.  Drinks 1 caffeinated drink per day.    REVIEW OF SYSTEMS:    GU Review Female:   Patient reports frequent urination, burning /pain with urination, get up at night to urinate, and have to strain to  urinate. Patient denies hard to postpone urination, leakage of urine, stream starts and stops, trouble starting your stream, and being pregnant.  Gastrointestinal (Upper):   Patient denies nausea, vomiting, and indigestion/ heartburn.  Gastrointestinal (Lower):   Patient denies diarrhea and constipation.  Constitutional:   Patient denies fever, night sweats, weight loss, and fatigue.  Skin:   Patient denies skin rash/ lesion and itching.  Eyes:   Patient denies blurred vision and double vision.  Ears/ Nose/ Throat:   Patient denies sore throat and sinus problems.  Hematologic/Lymphatic:   Patient reports easy bruising. Patient denies swollen glands.  Cardiovascular:   Patient denies leg swelling and chest pains.  Respiratory:   Patient denies cough and shortness of breath.  Endocrine:   Patient denies excessive thirst.  Musculoskeletal:   Patient reports back pain. Patient denies joint pain.  Neurological:   Patient denies headaches and dizziness.  Psychologic:   Patient denies depression and anxiety.   Notes: R abdomen pain with urination , R side flank pain     VITAL SIGNS:      09/16/2019 12:25 PM  Weight 163 lb / 73.94 kg  Height 66 in / 167.64 cm  BP 134/76 mmHg  Heart Rate 83 /min  Temperature 97.8 F / 36.5 C  BMI 26.3 kg/m   GU PHYSICAL EXAMINATION:    External Genitalia: No hirsutism, no rash, no scarring, no cyst, no erythematous lesion,  no papular lesion, no blanched lesion, no warty lesion. No edema.  Urethral Meatus: Normal size. Normal position. No discharge.  Urethra: No tenderness, no mass, no scarring. No hypermobility. No leakage.  Bladder: Normal to palpation, no tenderness, no mass, normal size.  Vagina: No atrophy, no stenosis. No rectocele. No cystocele. No enterocele.  Cervix: No inflammation, no discharge, no lesion, no tenderness, no wart.  Uterus: Normal size. Normal consistency. Normal position. No mobility. No descent.  Anus and Perineum: No hemorrhoids.  No anal stenosis. No rectal fissure, no anal fissure. No edema, no dimple, no perineal tenderness, no anal tenderness.   MULTI-SYSTEM PHYSICAL EXAMINATION:    Constitutional: Well-nourished. No physical deformities. Normally developed. Good grooming.  Neck: Neck symmetrical, not swollen. Normal tracheal position.  Respiratory: No labored breathing, no use of accessory muscles.   Cardiovascular: Normal temperature, normal extremity pulses, no swelling, no varicosities.  Lymphatic: No enlargement of neck, axillae, groin.  Skin: No paleness, no jaundice, no cyanosis. No lesion, no ulcer, no rash.  Neurologic / Psychiatric: Oriented to time, oriented to place, oriented to person. No depression, no anxiety, no agitation.  Gastrointestinal: No mass, no tenderness, no rigidity, non obese abdomen.  Eyes: Normal conjunctivae. Normal eyelids.  Ears, Nose, Mouth, and Throat: Left ear no scars, no lesions, no masses. Right ear no scars, no lesions, no masses. Nose no scars, no lesions, no masses. Normal hearing. Normal lips.  Musculoskeletal: Normal gait and station of head and neck.     PAST DATA REVIEWED:  Source Of History:  Patient  Records Review:   Previous Doctor Records, Previous Hospital Records, Previous Patient Records  Urine Test Review:   Urinalysis  X-Ray Review: C.T. Abdomen/Pelvis: Reviewed Films. Discussed With Patient.     PROCEDURES:          Urinalysis w/Scope - 81001 Dipstick Dipstick Cont'd Micro  Color: Yellow Bilirubin: Neg WBC/hpf: 6 - 10/hpf  Appearance: Clear Ketones: Neg RBC/hpf: 0 - 2/hpf  Specific Gravity: 1.015 Blood: Neg Bacteria: Rare (0-9/hpf)  pH: 6.0 Protein: Neg Cystals: NS (Not Seen)  Glucose: Neg Urobilinogen: 0.2 Casts: NS (Not Seen)    Nitrites: Neg Trichomonas: Not Present    Leukocyte Esterase: Trace Mucous: Not Present      Epithelial Cells: 10 - 20/hpf      Yeast: NS (Not Seen)      Sperm: Not Present    Notes:      ASSESSMENT:      ICD-10  Details  1 GU:   Renal calculus - N20.0    PLAN:           Schedule Return Visit/Planned Activity: 6 Weeks - Office Visit          Document Letter(s):  Created for Patient: Clinical Summary         Notes:   The patient has a partial right-sided staghorn stone. I am skeptical that her stone is the cause her pain. She is tender on her right flank. Fortunately she is not in pain currently. We discussed treatment options and I recommended she consider a PCNL. Ultimately she does need this treated, however it may not treat her pain. This is certainly not at urgent circumstance, as I suspect the stone has been there for many years accumulating. If the patient's pain persists and she would like to proceed sooner than later she will contact us. Otherwise will plan for her to follow up in approximately 6 weeks for further discussion.

## 2019-10-22 NOTE — Op Note (Signed)
Pre-operative diagnosis: right sided nephrolithiasis, >2.0 centimeters  Post-operative diagnosis: as above   Procedure performed:  cystoscopy, right retrograde pyelogram with interpretation, right percutaneous renal access, right nephrolithotomy, right nephrostogram, right antegrade double-J ureteral stent placement  Surgeon: Dr. Ardis Hughs  Assistant: None  Anesthesia: General  Complications: None  Specimens: Several small fragments of the stone extracted will be sent to [facility] for stone composition analysis  Findings: The retrograde demonstrated a filling defect in the lower pole of the kidney consistent with the patient's known stones, she also had a narrow UPJ, but no significant hydroureteronephrosis.    The access was difficult, as it was directly onto the stone, and the wires were bouncing off the stone and initially quite difficult to get down the UPJ.  I did ultimately perform ureteroscopy retrograde to basket the wire and directed down the UPJ.  Stone fragments were easily encountered and removed with a 2 pronged grasper.  A stent was placed in the antegrade fashion under fluoroscopy.  At the end, there was some initial bleeding once the tube was removed, but this with direct pressure stopped, and I ultimately opted to leave the OR without a nephrostomy tube.  EBL: Approximately 150 cc  Specimens: stone from collecting system - taken to [facility] lab  Indication: Indication: Ashley Dyer is a 74 y.o.  patient with  a large burden right nephrolithiasis. The patient presented today for follow-up definitive management. After  reviewing the management options for treatment, elected to proceed with the above surgical procedure(s). We have discussed the potential benefits and risks of the procedure, side effects of the proposed treatment, the likelihood of the patient achieving the goals of the procedure, and any potential problems  that might occur during the procedure or recuperation. Informed consent has been obtained.   Description:  Consent was obtained in the preoperative holding area. The patient was marked appropriately and then taken back to the operating room where they were intubated on the gurney. The patient was flipped prone onto the OR table. Large jelly rolls were placed in the anterior axillary line on both sides allowing the patient's chest and abdomen to fall inbetween. The patient was then prepped and draped in the routine sterile fashion in the right flank. A timeout was then held confirming the proper side and procedure as well as antibiotics were administered.  I then used the flexible cystoscope and passed gently into the patient's urethra under visual guidance.  I then passed a wire through the right ureteral orifice and advanced it up into the right renal pelvis under fluoroscopy.  I then removed the cystoscope over the wire and advanced a open-ended catheter over the wire.  The wire was then removed and a retrograde pyelogram was performed with the above findings. I then turned my attention to the patient's right flank and obtaining percutaneous renal access.  Using the C-arm rotated at 25 and the bulls-eye technique with an 18-gauge coaxial needle the lower posterior lateral calyx was targeted. Then rotating the C-arm AP depth of our needle was noted to be within the calyx and the inner part of the coaxial needle was removed. Urine was noted to return. A 0.038 sensor wire was then passed through the sheath of the coaxial needle and into the right renal collecting system.  At this point, difficulty getting the wire to pass down the UPJ and as such I advanced a ureteroscope over a second wire that I had placed in a  retrograde fashion up into the patient's ureter and renal pelvis.  Once the wire was encountered I was able to grasp it with a 0 tipped basket and pull it down into the ureter and ultimately into  the bladder.  An angiographic catheter was then advanced into the bladder and the wire removed. A Super Stiff wire was then passed into the angiographic catheter and the angiographic catheter removed.  I then advanced a dual-lumen catheter over the stiff wire and into the patient's bladder.  A Super Stiff wire was then passed through the dual-lumen catheter and the catheter removed.  This established 2 superstiff wires through the targeted calyx and into the bladder.   The 69 French NephroMax balloon was then passed over one of the Super Stiff wires and the tip guided down into the targeted calyx. The balloon was then inflated to approximately 12 atm, and once there was no waist noted under fluoroscopy the access sheath was advanced over the balloon. The balloon was then removed. The wires were then placed back into the sheaths and snapped to the drape.   Using the rigid nephroscope to explore the targeted calyx and kidney.  The stones were removed using the 2 prong grasper.  Then using a flexible cystoscope to navigate the remaining calyces of the kidney no additional stone fragments were encountered.  Contrast was injected through the cystoscope and the calyces systematically inspected under fluoroscopic guidance to ensure that all stone fragments had been removed.   Then using the flexible ureteroscope the ureter was navigated in antegrade fashion.  I had difficulty getting into the UPJ in an antegrade fashion, but did advanced  a 0.038 sensor wire through the ureter and in the bladder.  The sensor wire was then backloaded over the rigid nephroscope using the stent pusher and a 26 cm x 6 French double-J ureteral stent was passed antegrade over the sensor wire down into the bladder under fluoroscopic guidance. Once the stent was in the bladder the wire was gently pulled back and a nice curl noted in the bladder. The wire completely removed from the stent, and nice curl on the proximal end of the stent was  noted in the renal pelvis. The sheath was then backed out slowly to ensure that all calyces had been inspected and there was nothing behind the sheath.   A 37F ainsworth tip catheter was then passed over one of the Super Stiff wires through the sheath and into the renal pelvis. The sheath was then backed out of the kidney and cut over the red rubber catheter. A nephrostogram was then performed confirming the position of our nephrostomy tube and reassuring that there were no longer any filling defects from the patient's symptoms. After several minutes of direct pressure and observation was noted that there was no significant bleeding from the nephrostomy tube or around the nephrostomy tube tract.  As such, I remove the nephrostomy tube as well as the safety wire. 25 cc of local anesthesia was then injected into the patient's wound, and the wound was closed with 3-0 nylon in 2 vertical mattress sutures. The incision was then padded using a bundle of 4 x 4's and Hypafix tape. Patient was subsequently rolled over to the supine position and extubated. The patient was returned to the PACU in excellent condition. At the end of the case all lap and needle and sponges were accounted for. There are no perioperative complications.

## 2019-10-22 NOTE — OR Nursing (Signed)
Stone taken by Dr. Herrick 

## 2019-10-22 NOTE — Transfer of Care (Signed)
Immediate Anesthesia Transfer of Care Note  Patient: Ashley Dyer  Procedure(s) Performed: RIGHT NEPHROLITHOTOMY PERCUTANEOUS WITH SURGEON ACCESS (Right )  Patient Location: PACU  Anesthesia Type:General  Level of Consciousness: awake, drowsy and patient cooperative  Airway & Oxygen Therapy: Patient Spontanous Breathing and Patient connected to face mask oxygen  Post-op Assessment: Report given to RN and Post -op Vital signs reviewed and stable  Post vital signs: Reviewed and stable  Last Vitals:  Vitals Value Taken Time  BP 150/66 10/22/19 1057  Temp    Pulse 81 10/22/19 1059  Resp 13 10/22/19 1059  SpO2 96 % 10/22/19 1059  Vitals shown include unvalidated device data.  Last Pain:  Vitals:   10/22/19 0601  PainSc: 0-No pain         Complications: No apparent anesthesia complications

## 2019-10-22 NOTE — Interval H&P Note (Signed)
History and Physical Interval Note:  10/22/2019 7:14 AM  [place]  has presented today for surgery, with the diagnosis of RIGHT PARTIAL STAGHORN STONE.  The various methods of treatment have been discussed with the patient and family. After consideration of risks, benefits and other options for treatment, the patient has consented to  Procedure(s): RIGHT NEPHROLITHOTOMY PERCUTANEOUS WITH SURGEON ACCESS (Right) as a surgical intervention.  The patient's history has been reviewed, patient examined, no change in status, stable for surgery.  I have reviewed the patient's chart and labs.  Questions were answered to the patient's satisfaction.     Ardis Hughs

## 2019-10-23 DIAGNOSIS — E785 Hyperlipidemia, unspecified: Secondary | ICD-10-CM | POA: Diagnosis not present

## 2019-10-23 DIAGNOSIS — Z8249 Family history of ischemic heart disease and other diseases of the circulatory system: Secondary | ICD-10-CM | POA: Diagnosis not present

## 2019-10-23 DIAGNOSIS — I10 Essential (primary) hypertension: Secondary | ICD-10-CM | POA: Diagnosis not present

## 2019-10-23 DIAGNOSIS — Z87442 Personal history of urinary calculi: Secondary | ICD-10-CM | POA: Diagnosis not present

## 2019-10-23 DIAGNOSIS — Z96611 Presence of right artificial shoulder joint: Secondary | ICD-10-CM | POA: Diagnosis not present

## 2019-10-23 DIAGNOSIS — N200 Calculus of kidney: Secondary | ICD-10-CM | POA: Diagnosis not present

## 2019-10-23 DIAGNOSIS — Z79899 Other long term (current) drug therapy: Secondary | ICD-10-CM | POA: Diagnosis not present

## 2019-10-23 DIAGNOSIS — J4520 Mild intermittent asthma, uncomplicated: Secondary | ICD-10-CM | POA: Diagnosis not present

## 2019-10-23 DIAGNOSIS — Z96642 Presence of left artificial hip joint: Secondary | ICD-10-CM | POA: Diagnosis not present

## 2019-10-23 LAB — CBC
HCT: 35.4 % — ABNORMAL LOW (ref 36.0–46.0)
Hemoglobin: 11.8 g/dL — ABNORMAL LOW (ref 12.0–15.0)
MCH: 30.9 pg (ref 26.0–34.0)
MCHC: 33.3 g/dL (ref 30.0–36.0)
MCV: 92.7 fL (ref 80.0–100.0)
Platelets: 205 10*3/uL (ref 150–400)
RBC: 3.82 MIL/uL — ABNORMAL LOW (ref 3.87–5.11)
RDW: 14.2 % (ref 11.5–15.5)
WBC: 10.8 10*3/uL — ABNORMAL HIGH (ref 4.0–10.5)
nRBC: 0 % (ref 0.0–0.2)

## 2019-10-23 LAB — BASIC METABOLIC PANEL
Anion gap: 9 (ref 5–15)
BUN: 16 mg/dL (ref 8–23)
CO2: 26 mmol/L (ref 22–32)
Calcium: 8.9 mg/dL (ref 8.9–10.3)
Chloride: 105 mmol/L (ref 98–111)
Creatinine, Ser: 0.59 mg/dL (ref 0.44–1.00)
GFR calc Af Amer: >60 mL/min (ref >60)
GFR calc non Af Amer: >60 mL/min (ref >60)
Glucose, Bld: 120 mg/dL — ABNORMAL HIGH (ref 70–99)
Potassium: 3.9 mmol/L (ref 3.5–5.1)
Sodium: 140 mmol/L (ref 135–145)

## 2019-10-23 NOTE — Discharge Summary (Signed)
Date of admission: 10/22/2019  Date of discharge: 10/23/2019  Admission diagnosis: right partial staghorn stone  Discharge diagnosis: same  Secondary diagnoses:  Patient Active Problem List   Diagnosis Date Noted  . Nephrolithiasis 10/22/2019  . Recurrent nephrolithiasis 09/08/2019  . S/P hip replacement, left 05/11/2019  . 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  . 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 Essential hypertension 09/08/2018  . Dyslipidemia 09/08/2018  . Allergic rhinitis 09/08/2018  . Mild intermittent asthma without complication 21/30/8657  . 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    Procedures performed: Procedure(s): RIGHT NEPHROLITHOTOMY PERCUTANEOUS WITH SURGEON ACCESS  History and Physical: For full details, please see admission history and physical. Briefly, Ashley Dyer is a 74 y.o. year old patient with large lower pole stone.   Hospital Course: Patient tolerated the procedure well.  She was then transferred to the floor after an uneventful PACU stay.  Her hospital course was uncomplicated.  On POD#1 she had met discharge criteria: was eating a regular diet, was up and ambulating independently,  pain was well controlled, was voiding without a catheter, and was ready to for discharge.  PE: NAD Vitals:   10/22/19 1602 10/22/19 2034 10/23/19 0208 10/23/19 0457  BP: 130/68 134/71 (!) 121/59 (!) 130/58  Pulse: 88 93 80 79  Resp: '18  17 17  '$ Temp: 98.2 F (36.8 C) 98 F (36.7 C) 97.9 F (36.6 C) 98.3 F (36.8 C)  TempSrc: Oral Oral Oral Oral  SpO2: 96% 96% 94% 95%  Weight:      Height:       Non-labored breathing Abdomen is soft Incision on right flank is saturated but dry. Lower extremity is without edema Foley has been removed.  Laboratory values:  Recent Labs    10/22/19 1402 10/23/19 0506  WBC 10.9* 10.8*  HGB 12.8 11.8*  HCT 38.0 35.4*   Recent Labs    10/23/19 0506  NA 140  K 3.9  CL  105  CO2 26  GLUCOSE 120*  BUN 16  CREATININE 0.59  CALCIUM 8.9   No results for input(s): LABPT, INR in the last 72 hours. No results for input(s): LABURIN in the last 72 hours. Results for orders placed or performed during the hospital encounter of 10/19/19  SARS CORONAVIRUS 2 (TAT 6-24 HRS) Nasopharyngeal Nasopharyngeal Swab     Status: None   Collection Time: 10/19/19  2:34 PM   Specimen: Nasopharyngeal Swab  Result Value Ref Range Status   SARS Coronavirus 2 NEGATIVE NEGATIVE Final    Comment: (NOTE) SARS-CoV-2 target nucleic acids are NOT DETECTED. The SARS-CoV-2 RNA is generally detectable in upper and lower respiratory specimens during the acute phase of infection. Negative results do not preclude SARS-CoV-2 infection, do not rule out co-infections with other pathogens, and should not be used as the sole basis for treatment or other patient management decisions. Negative results must be combined with clinical observations, patient history, and epidemiological information. The expected result is Negative. Fact Sheet for Patients: SugarRoll.be Fact Sheet for Healthcare Providers: https://www.woods-mathews.com/ This test is not yet approved or cleared by the [place] FDA and  has been authorized for detection and/or diagnosis of SARS-CoV-2 by FDA under an Emergency Use Authorization (EUA). This EUA will remain  in effect (meaning this test can be used) for the duration of the COVID-19 declaration under Section 56 4(b)(1) of the Act, 21 U.S.C. section 360bbb-3(b)(1), unless the authorization is terminated or revoked sooner. Performed at [facility], [place] [address] [place], [place] [address]     Disposition: Home  Discharge instruction: The patient was instructed to be ambulatory but told to refrain from heavy lifting, strenuous activity, or driving.   Discharge medications:  Allergies as of 10/23/2019   No Known  Allergies     Medication List    TAKE these medications   ASHWAGANDHA PO Take 1,000 mg by mouth in the morning and at bedtime.   CALCIUM CITRATE PO Take 1 tablet by mouth daily.   cetirizine 10 MG tablet Commonly known as: ZYRTEC Take 10 mg by mouth daily as needed for allergies.   COQ-10 PO Take 1 capsule by mouth daily.   cromolyn 5.2 MG/ACT nasal spray Commonly known as: NASALCROM Place 1 spray into both nostrils 2 (two) times daily.   estradiol 0.1 MG/GM vaginal cream Commonly known as: ESTRACE Place 1 g vaginally 2 (two) times a week.   hydrocortisone 2.5 % lotion Apply 1 application topically daily as needed for rash. Apply to face   Ibuprofen-Acetaminophen 125-250 MG Tabs Take 2 tablets by mouth daily as needed (pain).   ketoconazole 2 % cream Commonly known as: NIZORAL Apply 1 application topically at bedtime as needed for rash.   Praluent 75 MG/ML Soaj Generic drug: Alirocumab Inject 75 mg as directed every 14 (fourteen) days.   traMADol 50 MG tablet Commonly known as: ULTRAM Take 50-100 mg by mouth every 6 (six) hours as needed for pain.   tretinoin 0.025 % cream Commonly known as: RETIN-A Apply 1 application topically every other day.   triamterene-hydrochlorothiazide 75-50 MG tablet Commonly known as: MAXZIDE Take 1 tablet by mouth every other day alternating with 1/2 tablet the other days What changed:   how much to take  how to take this  when to take this  additional instructions   VITAMIN C PO Take 1 tablet by mouth daily.   Vitamin D 50 MCG (2000 UT) tablet Take 4,000 Units by mouth daily.       Followup:  Follow-up Information    Jed Limerick, NP On 11/05/2019.   Specialty: Urology Why: Stent removal Contact information: [address] [place] [address] [phone]

## 2019-10-23 NOTE — Discharge Instructions (Signed)
Discharge instructions following PCNL  Call your doctor for: Fevers greater than 100.5 Severe nausea or vomiting Increasing pain not controlled by pain medication Increasing redness or drainage from incisions Decreased urine output or a catheter is no longer draining  The number for questions is [phone].  Activity: Gradually increase activity with short frequent walks, 3-4 times a day.  Avoid strenuous activities, like sports, lawn-mowing, or heavy lifting (more than 10-15 pounds).  Wear loose, comfortable clothing that pull or kink the tube or tubes.  Do not drive while taking pain medication, or until your doctor permitts it.  Bathing and dressing changes: You should not shower for 48 hours after surgery.  Do not soak your back in a bathtub.   Diet: It is extremely important to drink plenty of fluids after surgery, especially water.  You may resume your regular diet, unless otherwise instructed.  Medications: May take Tylenol (acetaminophen) or ibuprofen (Advil, Motrin) as directed over-the-counter. Take any prescriptions as directed.  Follow-up appointments: Follow-up appointment will be scheduled with Dr. Louis Meckel office in 10-14 days for hospital check and stent removal.

## 2019-10-23 NOTE — Progress Notes (Signed)
Patient discharged home.  IV removed - WNL.   Reviewed AVS and medications. Instructed on s/s of infection around incision and when to call MD if needed.  Follow up appointment in place.  Pt verbalizes understanding of instructions.  All questions answered satisfactorily.  Patient assisted off unit via [place] by NT in NAD.

## 2019-10-30 DIAGNOSIS — N200 Calculus of kidney: Secondary | ICD-10-CM | POA: Diagnosis not present

## 2019-11-06 DIAGNOSIS — N201 Calculus of ureter: Secondary | ICD-10-CM | POA: Diagnosis not present

## 2019-11-06 DIAGNOSIS — N200 Calculus of kidney: Secondary | ICD-10-CM | POA: Diagnosis not present

## 2019-11-12 DIAGNOSIS — M7062 Trochanteric bursitis, left hip: Secondary | ICD-10-CM | POA: Diagnosis not present

## 2019-12-18 DIAGNOSIS — N202 Calculus of kidney with calculus of ureter: Secondary | ICD-10-CM | POA: Diagnosis not present

## 2020-01-15 DIAGNOSIS — Z96612 Presence of left artificial shoulder joint: Secondary | ICD-10-CM | POA: Diagnosis not present

## 2020-01-15 DIAGNOSIS — Z471 Aftercare following joint replacement surgery: Secondary | ICD-10-CM | POA: Diagnosis not present

## 2020-02-13 ENCOUNTER — Other Ambulatory Visit: Payer: Self-pay | Admitting: Cardiovascular Disease

## 2020-05-20 ENCOUNTER — Telehealth: Payer: Self-pay | Admitting: Cardiovascular Disease

## 2020-05-20 NOTE — Telephone Encounter (Signed)
Spoke with pt and advised no orders are present for labs prior to appointment which pt has scheduled for 08/16/2020 at 1045am.  Pt advised to come fasting as she will most likely need a lipid panel for cholesterol.  Pt verbalizes understanding and agrees with current plan.

## 2020-05-20 NOTE — Telephone Encounter (Signed)
Ashley Dyer is calling wanting to know if she is needing labs prior to her one year that is scheduled as well as as if it needs to be fasting. Please advise.

## 2020-05-25 DIAGNOSIS — T466X5A Adverse effect of antihyperlipidemic and antiarteriosclerotic drugs, initial encounter: Secondary | ICD-10-CM | POA: Insufficient documentation

## 2020-06-08 DIAGNOSIS — L821 Other seborrheic keratosis: Secondary | ICD-10-CM | POA: Diagnosis not present

## 2020-06-08 DIAGNOSIS — L570 Actinic keratosis: Secondary | ICD-10-CM | POA: Diagnosis not present

## 2020-06-08 DIAGNOSIS — L820 Inflamed seborrheic keratosis: Secondary | ICD-10-CM | POA: Diagnosis not present

## 2020-06-08 DIAGNOSIS — L812 Freckles: Secondary | ICD-10-CM | POA: Diagnosis not present

## 2020-06-13 ENCOUNTER — Encounter: Payer: Self-pay | Admitting: Cardiovascular Disease

## 2020-06-13 ENCOUNTER — Other Ambulatory Visit: Payer: Self-pay | Admitting: Family Medicine

## 2020-06-13 DIAGNOSIS — Z1231 Encounter for screening mammogram for malignant neoplasm of breast: Secondary | ICD-10-CM

## 2020-06-13 NOTE — Progress Notes (Signed)
Cardiology Office Note:    Date:  06/14/2020   ID:  Ashley Dyer, DOB [birth year], MRN [id number]  PCP:  Ashley Barrack, MD  Cardiologist:  Mertie Moores, MD , previouis Ashley Dyer patient  Electrophysiologist:  None   Referring MD: Ashley Barrack, MD   Chief Complaint  Patient presents with  . Hypertension    February 16, 2019     Ashley Dyer is a 74 y.o. female with a hx of HTN, hyperlipidemia, asthma.  She is been followed by Dr. Wynonia Dyer in the past.  She was seen in January 05, 2017 by Beckie Busing, nurse practitioner for preoperative evaluation prior to shoulder surgery.  She has been intolerant to statin medications.  She is been on Praluent with good results. Still having leg cramps .   Is on a diuretic .  Recent left shoulder surgery on January 22, 2019 .  To start rehab in several weeks .   Exercises regularly   August 20, 2019:  Ashley Dyer is seen today for follow-up of her hyperlipidemia and hypertension. Feeling well Left hip has healed.  Getting back into exercise .  She is doing well on her Repatha.  She needs a refill.  She is a transfer from Dr. Wynonia Dyer and has not been seen in our lipid clinic yet.  We will make an appointment for her to be in our lipid clinic and get that refilled.  June 14, 2020: Ashley Dyer is seen today for follow-up of her hyperlipidemia, hypertension.  She is doing well on her Repatha. She is a previous patient of Dr. Wynonia Dyer. Has some occasional ankle edema ,  Has gained some weight .  Wt. Today is 169 lbs  Is waking quite   Past Medical History:  Diagnosis Date  . Allergy   . Anxiety    ED- visit for anxiety  . Arthritis    OA- shoulder(both) , hands  . Asthma   . Complication of anesthesia    fear- of feeling paralyzed - 1980's   . History of kidney stones 1980's   surgery  . Hyperlipidemia   . Hypertension   . Pneumonia   . 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 Walking pneumonia     Past Surgical History:    Procedure Laterality Date  . CATARACT EXTRACTION Bilateral   . EYE SURGERY    . KIDNEY STONE SURGERY    . NEPHROLITHOTOMY Right 10/22/2019   Procedure: RIGHT NEPHROLITHOTOMY PERCUTANEOUS WITH SURGEON ACCESS;  Surgeon: Ardis Hughs, MD;  Location: [facility];  Service: Urology;  Laterality: Right;  . TONSILLECTOMY    . TOTAL HIP ARTHROPLASTY Left 05/11/2019   Procedure: LEFT TOTAL HIP ARTHROPLASTY ANTERIOR APPROACH;  Surgeon: Frederik Pear, MD;  Location: [facility];  Service: Orthopedics;  Laterality: Left;  . TOTAL SHOULDER ARTHROPLASTY Right 06/13/2017  . TOTAL SHOULDER ARTHROPLASTY Right 06/13/2017   Procedure: TOTAL SHOULDER ARTHROPLASTY;  Surgeon: Tania Ade, MD;  Location: [facility];  Service: Orthopedics;  Laterality: Right;  Right total shoulder arthroplasty  . TOTAL SHOULDER ARTHROPLASTY Left 01/22/2019   Procedure: TOTAL SHOULDER ARTHROPLASTY;  Surgeon: Tania Ade, MD;  Location: [facility];  Service: Orthopedics;  Laterality: Left;  Marland Kitchen VAGINAL DELIVERY  1976    Current Medications: Current Meds  Medication Sig  . Alirocumab (PRALUENT) 75 MG/ML SOAJ Inject 75 mg as directed every 14 (fourteen) days.  . Ascorbic Acid (VITAMIN C PO) Take 1 tablet by mouth daily.  . ASHWAGANDHA PO Take 1,000 mg by  mouth in the morning and at bedtime.  Marland Kitchen CALCIUM CITRATE PO Take 1 tablet by mouth daily.   . cetirizine (ZYRTEC) 10 MG tablet Take 10 mg by mouth daily as needed for allergies.  . Cholecalciferol (VITAMIN D) 50 MCG (2000 UT) tablet Take 4,000 Units by mouth daily.  . Coenzyme Q10 (COQ-10 PO) Take 1 capsule by mouth daily.  . cromolyn (NASALCROM) 5.2 MG/ACT nasal spray Place 1 spray into both nostrils 2 (two) times daily.   Marland Kitchen estradiol (ESTRACE) 0.1 MG/GM vaginal cream Place 1 g vaginally 2 (two) times a week.   . hydrocortisone 2.5 % lotion Apply 1 application topically daily as needed for rash. Apply to face  . Ibuprofen-Acetaminophen 125-250 MG TABS Take 2 tablets by mouth daily as  needed (pain).  Marland Kitchen ketoconazole (NIZORAL) 2 % cream Apply 1 application topically at bedtime as needed for rash.  . tretinoin (RETIN-A) 0.025 % cream Apply 1 application topically every other day.  . triamterene-hydrochlorothiazide (MAXZIDE) 75-50 MG tablet TAKE 1 TABLET BY MOUTH EVERY OTHER DAY ALTERNATING WITH 1/2 TABLET THE OTHER DAYS     Allergies:   Patient has no known allergies.   Social History   Socioeconomic History  . Marital status: Single    Spouse name: Not on file  . Number of children: Not on file  . Years of education: Not on file  . Highest education level: Not on file  Occupational History  . Not on file  Tobacco Use  . Smoking status: Former Smoker    Quit date: 1990    Years since quitting: 31.8  . Smokeless tobacco: Never Used  Vaping Use  . Vaping Use: Never used  Substance and Sexual Activity  . Alcohol use: Yes    Alcohol/week: 8.0 - 10.0 standard drinks    Types: 8 - 10 Glasses of wine per week    Comment: week   . Drug use: No  . Sexual activity: Not Currently  Other Topics Concern  . Not on file  Social History Narrative  . Not on file   Social Determinants of Health   Financial Resource Strain:   . Difficulty of Paying Living Expenses: Not on file  Food Insecurity:   . Worried About Charity fundraiser in the Last Year: Not on file  . Ran Out of Food in the Last Year: Not on file  Transportation Needs:   . Lack of Transportation (Medical): Not on file  . Lack of Transportation (Non-Medical): Not on file  Physical Activity:   . Days of Exercise per Week: Not on file  . Minutes of Exercise per Session: Not on file  Stress:   . Feeling of Stress : Not on file  Social Connections:   . Frequency of Communication with Friends and Family: Not on file  . Frequency of Social Gatherings with Friends and Family: Not on file  . Attends Religious Services: Not on file  . Active Member of Clubs or Organizations: Not on file  . Attends Theatre manager Meetings: Not on file  . Marital Status: Not on file     Family History: The patient's family history includes Atrial fibrillation in her mother; Colon cancer in her paternal grandfather; Heart disease in her father; Leukemia in her brother. There is no history of Breast cancer.  ROS:   Please see the history of present illness.     All other systems reviewed and are negative.  EKGs/Labs/Other Studies Reviewed:  The following studies were reviewed today:   Recent Labs: 10/16/2019: ALT 22 10/23/2019: BUN 16; Creatinine, Ser 0.59; Hemoglobin 11.8; Platelets 205; Potassium 3.9; Sodium 140  Recent Lipid Panel    Component Value Date/Time   CHOL 180 08/20/2019 1030   TRIG 107 08/20/2019 1030   HDL 84 08/20/2019 1030   CHOLHDL 2.1 08/20/2019 1030   LDLCALC 77 08/20/2019 1030    Physical Exam:    Physical Exam: Blood pressure (!) 146/78, pulse 83, height 5\' 6"  (1.676 m), weight 169 lb 3.2 oz (76.7 kg), SpO2 96 %.  GEN:  Well nourished, well developed in no acute distress HEENT: Normal NECK: No JVD; No carotid bruits LYMPHATICS: No lymphadenopathy CARDIAC: RRR , no murmurs, rubs, gallops RESPIRATORY:  Clear to auscultation without rales, wheezing or rhonchi  ABDOMEN: Soft, non-tender, non-distended MUSCULOSKELETAL:  No edema; No deformity  SKIN: Warm and dry NEUROLOGIC:  Alert and oriented x 3   EKG:  Nov. 9, 2021  NSR at 83.   No ST or T wave changes.     ASSESSMENT:    1. Essential hypertension   2. Coronary artery disease involving native coronary artery of native heart without angina pectoris   3. Hypercholesterolemia    PLAN:    In order of problems listed above:  1. Hyperlipidemia:    Cont. pralulent .   Labs have been stable    2.  Hypertension:   BP is elevated today .   She has gained some weight and has been eating more salt that she should.   Will add Losartan 50 mg a day to her meds BMP in 3 weeks.  Follow up as previously scheduled  with Richardson Dopp, PA in Jan.   3.  CAD : She had a coronary calcium score under Dr. Wynonia Dyer.       Medication Adjustments/Labs and Tests Ordered: Current medicines are reviewed at length with the patient today.  Concerns regarding medicines are outlined above.  Orders Placed This Encounter  Procedures  . Basic Metabolic Panel (BMET)  . EKG 12-Lead   Meds ordered this encounter  Medications  . losartan (COZAAR) 50 MG tablet    Sig: Take 1 tablet (50 mg total) by mouth daily.    Dispense:  90 tablet    Refill:  3    Patient Instructions  Medication Instructions:  Your physician has recommended you make the following change in your medication:  START Losartan 50 mg once daily  *If you need a refill on your cardiac medications before your next appointment, please call your pharmacy*   Lab Work: Your physician recommends that you return for lab work in: 3 weeks for basic metabolic panel on Dec. 2 - you may come in anytime between 7:30 am and 4:30 pm You do not have to fast for this appointment  If you have labs (blood work) drawn today and your tests are completely normal, you will receive your results only by: Marland Kitchen MyChart Message (if you have MyChart) OR . A paper copy in the mail If you have any lab test that is abnormal or we need to change your treatment, we will call you to review the results.   Testing/Procedures: None Ordered   Follow-Up: At [facility], you and your health needs are our priority.  As part of our continuing mission to provide you with exceptional heart care, we have created designated Provider Care Teams.  These Care Teams include your primary Cardiologist (physician) and Advanced Practice  Providers (APPs -  Physician Assistants and Nurse Practitioners) who all work together to provide you with the care you need, when you need it.   Your next appointment:   2 month(s) on Jan. 11 at 10:15 am  The format for your next appointment:   In  Person  Provider:   Richardson Dopp, PA-C        Signed, Mertie Moores, MD  06/14/2020 1:16 PM    Ashley Dyer

## 2020-06-14 ENCOUNTER — Other Ambulatory Visit: Payer: Self-pay

## 2020-06-14 ENCOUNTER — Encounter: Payer: Self-pay | Admitting: Cardiovascular Disease

## 2020-06-14 ENCOUNTER — Ambulatory Visit: Payer: Medicare Other | Admitting: Cardiovascular Disease

## 2020-06-14 VITALS — BP 146/78 | HR 83 | Ht 66.0 in | Wt 169.2 lb

## 2020-06-14 DIAGNOSIS — I10 Essential (primary) hypertension: Secondary | ICD-10-CM | POA: Diagnosis not present

## 2020-06-14 DIAGNOSIS — I2510 Atherosclerotic heart disease of native coronary artery without angina pectoris: Secondary | ICD-10-CM | POA: Diagnosis not present

## 2020-06-14 DIAGNOSIS — E7800 Pure hypercholesterolemia, unspecified: Secondary | ICD-10-CM | POA: Diagnosis not present

## 2020-06-14 MED ORDER — LOSARTAN POTASSIUM 50 MG PO TABS: 50.0000 mg | ORAL_TABLET | Freq: Every day | ORAL | 3 refills | Status: DC

## 2020-06-14 NOTE — Patient Instructions (Addendum)
Medication Instructions:  Your physician has recommended you make the following change in your medication:  START Losartan 50 mg once daily  *If you need a refill on your cardiac medications before your next appointment, please call your pharmacy*   Lab Work: Your physician recommends that you return for lab work in: 3 weeks for basic metabolic panel on Dec. 2 - you may come in anytime between 7:30 am and 4:30 pm You do not have to fast for this appointment  If you have labs (blood work) drawn today and your tests are completely normal, you will receive your results only by: Marland Kitchen MyChart Message (if you have MyChart) OR . A paper copy in the mail If you have any lab test that is abnormal or we need to change your treatment, we will call you to review the results.   Testing/Procedures: None Ordered   Follow-Up: At [facility], you and your health needs are our priority.  As part of our continuing mission to provide you with exceptional heart care, we have created designated Provider Care Teams.  These Care Teams include your primary Cardiologist (physician) and Advanced Practice Providers (APPs -  Physician Assistants and Nurse Practitioners) who all work together to provide you with the care you need, when you need it.   Your next appointment:   2 month(s) on Jan. 11 at 10:15 am  The format for your next appointment:   In Person  Provider:   Richardson Dopp, PA-C

## 2020-06-15 ENCOUNTER — Ambulatory Visit
Admission: RE | Admit: 2020-06-15 | Discharge: 2020-06-15 | Disposition: A | Discharge status: Home / Self Care | Payer: Medicare Other | Source: Ambulatory Visit

## 2020-06-15 DIAGNOSIS — Z1231 Encounter for screening mammogram for malignant neoplasm of breast: Secondary | ICD-10-CM | POA: Diagnosis not present

## 2020-07-07 ENCOUNTER — Other Ambulatory Visit: Payer: Self-pay

## 2020-07-07 ENCOUNTER — Other Ambulatory Visit: Payer: Medicare Other | Admitting: *Deleted

## 2020-07-07 DIAGNOSIS — I10 Essential (primary) hypertension: Secondary | ICD-10-CM | POA: Diagnosis not present

## 2020-07-07 DIAGNOSIS — I2510 Atherosclerotic heart disease of native coronary artery without angina pectoris: Secondary | ICD-10-CM

## 2020-07-07 DIAGNOSIS — E7800 Pure hypercholesterolemia, unspecified: Secondary | ICD-10-CM | POA: Diagnosis not present

## 2020-07-07 LAB — BASIC METABOLIC PANEL
BUN/Creatinine Ratio: 22 (ref 12–28)
BUN: 17 mg/dL (ref 8–27)
CO2: 26 mmol/L (ref 20–29)
Calcium: 9.7 mg/dL (ref 8.7–10.3)
Chloride: 100 mmol/L (ref 96–106)
Creatinine, Ser: 0.77 mg/dL (ref 0.57–1.00)
GFR calc Af Amer: 88 mL/min/{1.73_m2} (ref >59)
GFR calc non Af Amer: 76 mL/min/{1.73_m2} (ref >59)
Glucose: 106 mg/dL — ABNORMAL HIGH (ref 65–99)
Potassium: 3.8 mmol/L (ref 3.5–5.2)
Sodium: 140 mmol/L (ref 134–144)

## 2020-07-21 ENCOUNTER — Encounter: Payer: Self-pay | Admitting: Family Medicine

## 2020-07-21 NOTE — Telephone Encounter (Signed)
Please advise 

## 2020-07-22 ENCOUNTER — Other Ambulatory Visit: Payer: Self-pay | Admitting: *Deleted

## 2020-07-22 MED ORDER — FLUTICASONE PROPIONATE HFA 110 MCG/ACT IN AERO: 1.0000 | INHALATION_SPRAY | Freq: Two times a day (BID) | RESPIRATORY_TRACT | 2 refills | Status: DC | PRN

## 2020-07-22 NOTE — Telephone Encounter (Signed)
Flovent 110 mcg was send to pharmacy

## 2020-08-15 NOTE — Progress Notes (Signed)
Cardiology Office Note:    Date:  08/16/2020   ID:  Ashley Dyer, DOB June 14, 1946, MRN [id number]  PCP:  Ashley Barrack, MD  [facility] Cardiologist:  Ashley Moores, MD   [facility] Electrophysiologist:  None   Referring MD: Ashley Barrack, MD   Chief Complaint:  Follow-up (HTN)    Patient Profile:    Ashley Dyer is a 75 y.o. female with:   Coronary artery calcification (CT 2018)  Hypertension   Hyperlipidemia   Intol of statins >> Alirocumab   Asthma   Prior CV studies: CT cardiac scoring 07/24/2017 IMPRESSION: 1. Three-vessel Coronary artery calcification. 2. Total Agatston Score: 559 3. MESA database percentile:  92 nd percentile  History of Present Illness:    Ashley Dyer was last seen by Dr. Acie Dyer 06/14/2020.  Her blood pressure was above target and losartan was added to her medical regimen.  She returns for follow-up.  She is here alone.  She is doing well without chest pain, shortness of breath, syncope, leg edema, HAs, blurry vision.  She did have some dizziness and fatigue after starting Losartan but that has resolved.        Past Medical History:  Diagnosis Date  . Allergy   . Anxiety    ED- visit for anxiety  . Arthritis    OA- shoulder(both) , hands  . Asthma   . Complication of anesthesia    fear- of feeling paralyzed - 1980's   . History of kidney stones 1980's   surgery  . Hyperlipidemia   . Hypertension   . Pneumonia   . 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 Walking pneumonia     Current Medications: Current Meds  Medication Sig  . Alirocumab (PRALUENT) 75 MG/ML SOAJ Inject 75 mg as directed every 14 (fourteen) days.  . Ascorbic Acid (VITAMIN C PO) Take 1 tablet by mouth daily.  . ASHWAGANDHA PO Take 1,000 mg by mouth in the morning and at bedtime.  Marland Kitchen CALCIUM CITRATE PO Take 1 tablet by mouth daily.   . Cholecalciferol (VITAMIN D) 50 MCG (2000 UT) tablet Take 4,000 Units by mouth daily.  . Coenzyme Q10 (COQ-10  PO) Take 1 capsule by mouth daily.  . cromolyn (NASALCROM) 5.2 MG/ACT nasal spray Place 1 spray into both nostrils 2 (two) times daily.  Marland Kitchen estradiol (ESTRACE) 0.1 MG/GM vaginal cream Place 1 g vaginally 2 (two) times a week.   . fluticasone (FLOVENT HFA) 110 MCG/ACT inhaler Inhale 1 puff into the lungs 2 (two) times daily as needed (wheezing or shortness of breath).  . hydrocortisone 2.5 % lotion Apply 1 application topically daily as needed for rash. Apply to face  . Ibuprofen-Acetaminophen 125-250 MG TABS Take 2 tablets by mouth daily as needed (pain).  Marland Kitchen ketoconazole (NIZORAL) 2 % cream Apply 1 application topically at bedtime as needed for rash.  . losartan (COZAAR) 50 MG tablet Take 1 tablet (50 mg total) by mouth daily.  Marland Kitchen tretinoin (RETIN-A) 0.025 % cream Apply 1 application topically every other day.  . triamterene-hydrochlorothiazide (MAXZIDE) 75-50 MG tablet TAKE 1 TABLET BY MOUTH EVERY OTHER DAY ALTERNATING WITH 1/2 TABLET THE OTHER DAYS     Allergies:   Patient has no known allergies.   Social History   Tobacco Use  . Smoking status: Former Smoker    Quit date: 1990    Years since quitting: 32.0  . Smokeless tobacco: Never Used  Vaping Use  . Vaping Use: Never used  Substance Use Topics  . Alcohol use: Yes    Alcohol/week: 8.0 - 10.0 standard drinks    Types: 8 - 10 Glasses of wine per week    Comment: week   . Drug use: No     Family Hx: The patient's family history includes Atrial fibrillation in her mother; Colon cancer in her paternal grandfather; Heart disease in her father; Leukemia in her brother. There is no history of Breast cancer.  ROS   EKGs/Labs/Other Test Reviewed:    EKG:  EKG is not ordered today.  The ekg ordered today demonstrates n/a  Recent Labs: 10/16/2019: ALT 22 10/23/2019: Hemoglobin 11.8; Platelets 205 07/07/2020: BUN 17; Creatinine, Ser 0.77; Potassium 3.8; Sodium 140   Recent Lipid Panel Lab Results  Component Value Date/Time   CHOL  180 08/20/2019 10:30 AM   TRIG 107 08/20/2019 10:30 AM   HDL 84 08/20/2019 10:30 AM   CHOLHDL 2.1 08/20/2019 10:30 AM   LDLCALC 77 08/20/2019 10:30 AM      Risk Assessment/Calculations:      Physical Exam:    VS:  BP 132/70   Pulse 81   Ht 5\' 6"  (1.676 m)   Wt 168 lb (76.2 kg)   SpO2 99%   BMI 27.12 kg/m     Wt Readings from Last 3 Encounters:  08/16/20 168 lb (76.2 kg)  06/14/20 169 lb 3.2 oz (76.7 kg)  10/22/19 165 lb (74.8 kg)     Constitutional:      Appearance: Healthy appearance. Not in distress.  Pulmonary:     Effort: Pulmonary effort is normal.     Breath sounds: No wheezing. No rales.  Cardiovascular:     Normal rate. Regular rhythm. Normal S1. Normal S2.     Murmurs: There is no murmur.  Edema:    Peripheral edema absent.  Abdominal:     Palpations: Abdomen is soft.  Musculoskeletal:     Cervical back: Neck supple. Skin:    General: Skin is warm and dry.  Neurological:     Mental Status: Alert and oriented to person, place and time.     Cranial Nerves: Cranial nerves are intact.       ASSESSMENT & PLAN:    1. Essential hypertension Repeat BP by me today is 128/76.  Her BP seems to be well controlled now on her current medical regimen.  Continue Losartan 50 mg once daily and triamterene/HCTZ 75-50 mg every other day alternated with 1/2 tab every other day.  Continue to monitor BP at home and notify us if BP > 130/80.    2. Hyperlipidemia  Continue current dose of Alirocumab.  Arrange f/u fasting CMET, Lipids.          Dispo:  Return in about 1 year (around 08/16/2021) for Routine Follow Up, w/ Dr. Acie Dyer, in person.   Medication Adjustments/Labs and Tests Ordered: Current medicines are reviewed at length with the patient today.  Concerns regarding medicines are outlined above.  Tests Ordered: Orders Placed This Encounter  Procedures  . Comprehensive metabolic panel  . Lipid panel   Medication Changes: No orders of the defined types were  placed in this encounter.   Signed, Ashley Dopp, PA-C  08/16/2020 10:58 AM    [facility] [address], [place], [place]  [address] Phone: [phone]; Fax: [phone]

## 2020-08-16 ENCOUNTER — Encounter: Payer: Self-pay | Admitting: Physician Assistant

## 2020-08-16 ENCOUNTER — Other Ambulatory Visit: Payer: Self-pay

## 2020-08-16 ENCOUNTER — Ambulatory Visit: Payer: Medicare Other | Admitting: Physician Assistant

## 2020-08-16 VITALS — BP 132/70 | HR 81 | Ht 66.0 in | Wt 168.0 lb

## 2020-08-16 DIAGNOSIS — G720 Drug-induced myopathy: Secondary | ICD-10-CM

## 2020-08-16 DIAGNOSIS — I2584 Coronary atherosclerosis due to calcified coronary lesion: Secondary | ICD-10-CM

## 2020-08-16 DIAGNOSIS — I10 Essential (primary) hypertension: Secondary | ICD-10-CM

## 2020-08-16 DIAGNOSIS — T466X5A Adverse effect of antihyperlipidemic and antiarteriosclerotic drugs, initial encounter: Secondary | ICD-10-CM

## 2020-08-16 DIAGNOSIS — E785 Hyperlipidemia, unspecified: Secondary | ICD-10-CM | POA: Diagnosis not present

## 2020-08-16 DIAGNOSIS — M791 Myalgia, unspecified site: Secondary | ICD-10-CM

## 2020-08-16 DIAGNOSIS — I2510 Atherosclerotic heart disease of native coronary artery without angina pectoris: Secondary | ICD-10-CM

## 2020-08-16 NOTE — Patient Instructions (Signed)
Medication Instructions:  Your physician recommends that you continue on your current medications as directed. Please refer to the Current Medication list given to you today.  *If you need a refill on your cardiac medications before your next appointment, please call your pharmacy*  Lab Work: Your physician recommends that you return for lab work in: 2 weeks on 08/30/20. Come fasting to this appointment, your cholesterol will be checked.  Testing/Procedures: None ordered today  Follow-Up: At [facility], you and your health needs are our priority.  As part of our continuing mission to provide you with exceptional heart care, we have created designated Provider Care Teams.  These Care Teams include your primary Cardiologist (physician) and Advanced Practice Providers (APPs -  Physician Assistants and Nurse Practitioners) who all work together to provide you with the care you need, when you need it.  Your next appointment:   12 month(s)  The format for your next appointment:   In Person  Provider:   Mertie Moores, MD  Other Instructions Check your blood pressure and call if the readings are 130/80 or higher consistently.

## 2020-08-30 ENCOUNTER — Other Ambulatory Visit: Payer: Self-pay

## 2020-08-30 ENCOUNTER — Other Ambulatory Visit: Payer: Medicare Other | Admitting: *Deleted

## 2020-08-30 DIAGNOSIS — E785 Hyperlipidemia, unspecified: Secondary | ICD-10-CM | POA: Diagnosis not present

## 2020-08-30 DIAGNOSIS — E7800 Pure hypercholesterolemia, unspecified: Secondary | ICD-10-CM

## 2020-08-30 DIAGNOSIS — I10 Essential (primary) hypertension: Secondary | ICD-10-CM | POA: Diagnosis not present

## 2020-08-30 LAB — COMPREHENSIVE METABOLIC PANEL
ALT: 22 IU/L (ref 0–32)
AST: 30 IU/L (ref 0–40)
Albumin/Globulin Ratio: 1.7 (ref 1.2–2.2)
Albumin: 4.5 g/dL (ref 3.7–4.7)
Alkaline Phosphatase: 84 IU/L (ref 44–121)
BUN/Creatinine Ratio: 30 — ABNORMAL HIGH (ref 12–28)
BUN: 23 mg/dL (ref 8–27)
Bilirubin Total: 0.5 mg/dL (ref 0.0–1.2)
CO2: 27 mmol/L (ref 20–29)
Calcium: 9.7 mg/dL (ref 8.7–10.3)
Chloride: 100 mmol/L (ref 96–106)
Creatinine, Ser: 0.77 mg/dL (ref 0.57–1.00)
GFR calc Af Amer: 88 mL/min/{1.73_m2} (ref >59)
GFR calc non Af Amer: 76 mL/min/{1.73_m2} (ref >59)
Globulin, Total: 2.6 g/dL (ref 1.5–4.5)
Glucose: 96 mg/dL (ref 65–99)
Potassium: 3.8 mmol/L (ref 3.5–5.2)
Sodium: 139 mmol/L (ref 134–144)
Total Protein: 7.1 g/dL (ref 6.0–8.5)

## 2020-08-30 LAB — LIPID PANEL
Chol/HDL Ratio: 3.3 ratio (ref 0.0–4.4)
Cholesterol, Total: 223 mg/dL — ABNORMAL HIGH (ref 100–199)
HDL: 68 mg/dL (ref >39)
LDL Chol Calc (NIH): 122 mg/dL — ABNORMAL HIGH (ref 0–99)
Triglycerides: 193 mg/dL — ABNORMAL HIGH (ref 0–149)
VLDL Cholesterol Cal: 33 mg/dL (ref 5–40)

## 2020-09-01 ENCOUNTER — Other Ambulatory Visit: Payer: Self-pay | Admitting: Cardiovascular Disease

## 2020-09-01 DIAGNOSIS — E785 Hyperlipidemia, unspecified: Secondary | ICD-10-CM

## 2020-09-01 NOTE — Telephone Encounter (Signed)
Please schedule fasting lipids in 3 months. I have already placed the order in Epic. Richardson Dopp, PA-C    09/01/2020 8:52 AM

## 2020-09-02 ENCOUNTER — Other Ambulatory Visit: Payer: Self-pay | Admitting: Cardiovascular Disease

## 2020-09-22 NOTE — Progress Notes (Signed)
Review of chart and records from prior cardiologist indicates patient had significant myalgias with multiple low dose statins in the past and was therefore switched to PCSK9 inhibitors. Richardson Dopp, PA-C    09/22/2020 9:09 AM

## 2020-12-01 ENCOUNTER — Other Ambulatory Visit: Payer: Self-pay

## 2020-12-01 ENCOUNTER — Other Ambulatory Visit: Payer: Medicare Other

## 2020-12-01 DIAGNOSIS — E7800 Pure hypercholesterolemia, unspecified: Secondary | ICD-10-CM

## 2020-12-01 LAB — LIPID PANEL
Chol/HDL Ratio: 3.6 ratio (ref 0.0–4.4)
Cholesterol, Total: 217 mg/dL — ABNORMAL HIGH (ref 100–199)
HDL: 60 mg/dL (ref >39)
LDL Chol Calc (NIH): 130 mg/dL — ABNORMAL HIGH (ref 0–99)
Triglycerides: 154 mg/dL — ABNORMAL HIGH (ref 0–149)
VLDL Cholesterol Cal: 27 mg/dL (ref 5–40)

## 2020-12-02 ENCOUNTER — Other Ambulatory Visit: Payer: Self-pay | Admitting: *Deleted

## 2020-12-02 DIAGNOSIS — E7800 Pure hypercholesterolemia, unspecified: Secondary | ICD-10-CM

## 2020-12-02 DIAGNOSIS — E785 Hyperlipidemia, unspecified: Secondary | ICD-10-CM

## 2020-12-02 MED ORDER — PRALUENT 150 MG/ML ~~LOC~~ SOAJ: 1.0000 "pen " | SUBCUTANEOUS | 11 refills | Status: DC

## 2021-01-19 ENCOUNTER — Other Ambulatory Visit: Payer: Self-pay | Admitting: Family Medicine

## 2021-02-07 DIAGNOSIS — I2584 Coronary atherosclerosis due to calcified coronary lesion: Secondary | ICD-10-CM | POA: Insufficient documentation

## 2021-02-09 ENCOUNTER — Other Ambulatory Visit: Payer: Medicare Other

## 2021-02-09 ENCOUNTER — Other Ambulatory Visit: Payer: Self-pay

## 2021-02-09 DIAGNOSIS — E785 Hyperlipidemia, unspecified: Secondary | ICD-10-CM

## 2021-02-09 DIAGNOSIS — E7800 Pure hypercholesterolemia, unspecified: Secondary | ICD-10-CM

## 2021-02-09 LAB — LIPID PANEL
Chol/HDL Ratio: 3 ratio (ref 0.0–4.4)
Cholesterol, Total: 202 mg/dL — ABNORMAL HIGH (ref 100–199)
HDL: 68 mg/dL (ref >39)
LDL Chol Calc (NIH): 106 mg/dL — ABNORMAL HIGH (ref 0–99)
Triglycerides: 166 mg/dL — ABNORMAL HIGH (ref 0–149)
VLDL Cholesterol Cal: 28 mg/dL (ref 5–40)

## 2021-02-09 LAB — HEPATIC FUNCTION PANEL
ALT: 22 IU/L (ref 0–32)
AST: 33 IU/L (ref 0–40)
Albumin: 4.7 g/dL (ref 3.7–4.7)
Alkaline Phosphatase: 84 IU/L (ref 44–121)
Bilirubin Total: 0.6 mg/dL (ref 0.0–1.2)
Bilirubin, Direct: 0.15 mg/dL (ref 0.00–0.40)
Total Protein: 7.4 g/dL (ref 6.0–8.5)

## 2021-02-15 ENCOUNTER — Telehealth: Payer: Self-pay | Admitting: *Deleted

## 2021-02-15 DIAGNOSIS — E7800 Pure hypercholesterolemia, unspecified: Secondary | ICD-10-CM

## 2021-02-15 MED ORDER — EZETIMIBE 10 MG PO TABS: 10.0000 mg | ORAL_TABLET | Freq: Every day | ORAL | 3 refills | Status: DC

## 2021-02-15 NOTE — Telephone Encounter (Signed)
-----   Message from Liliane Shi, [place] sent at 02/10/2021  7:20 AM EDT ----- LFTs normal LDL still above goal  Plan: Ask patient if she has ever tried Ezetimibe (Zetia).  If no - add Ezetimibe 10 mg once daily and check Lipids and LFTs in 3 mos.  If yes and DCd due to side effects - refer to Lipid Clinic to see if bempedoic acid is an option.  Richardson Dopp, PA-C 07:19 02/10/2021

## 2021-03-01 IMAGING — RF DG HIP (WITH PELVIS) OPERATIVE*L*
1 series · 2 of 2 positions shown · non-contrast
Comparison: None.

CLINICAL DATA: LEFT hip arthroplasty

EXAM:
OPERATIVE LEFT HIP (WITH PELVIS IF PERFORMED) 2 VIEWS
TECHNIQUE: Fluoroscopic spot image(s) were submitted for interpretation
post-operatively.

[Series 1: unknown protocol · 0.20mm/px · 2 of 2 slices shown]
[im 1/2]
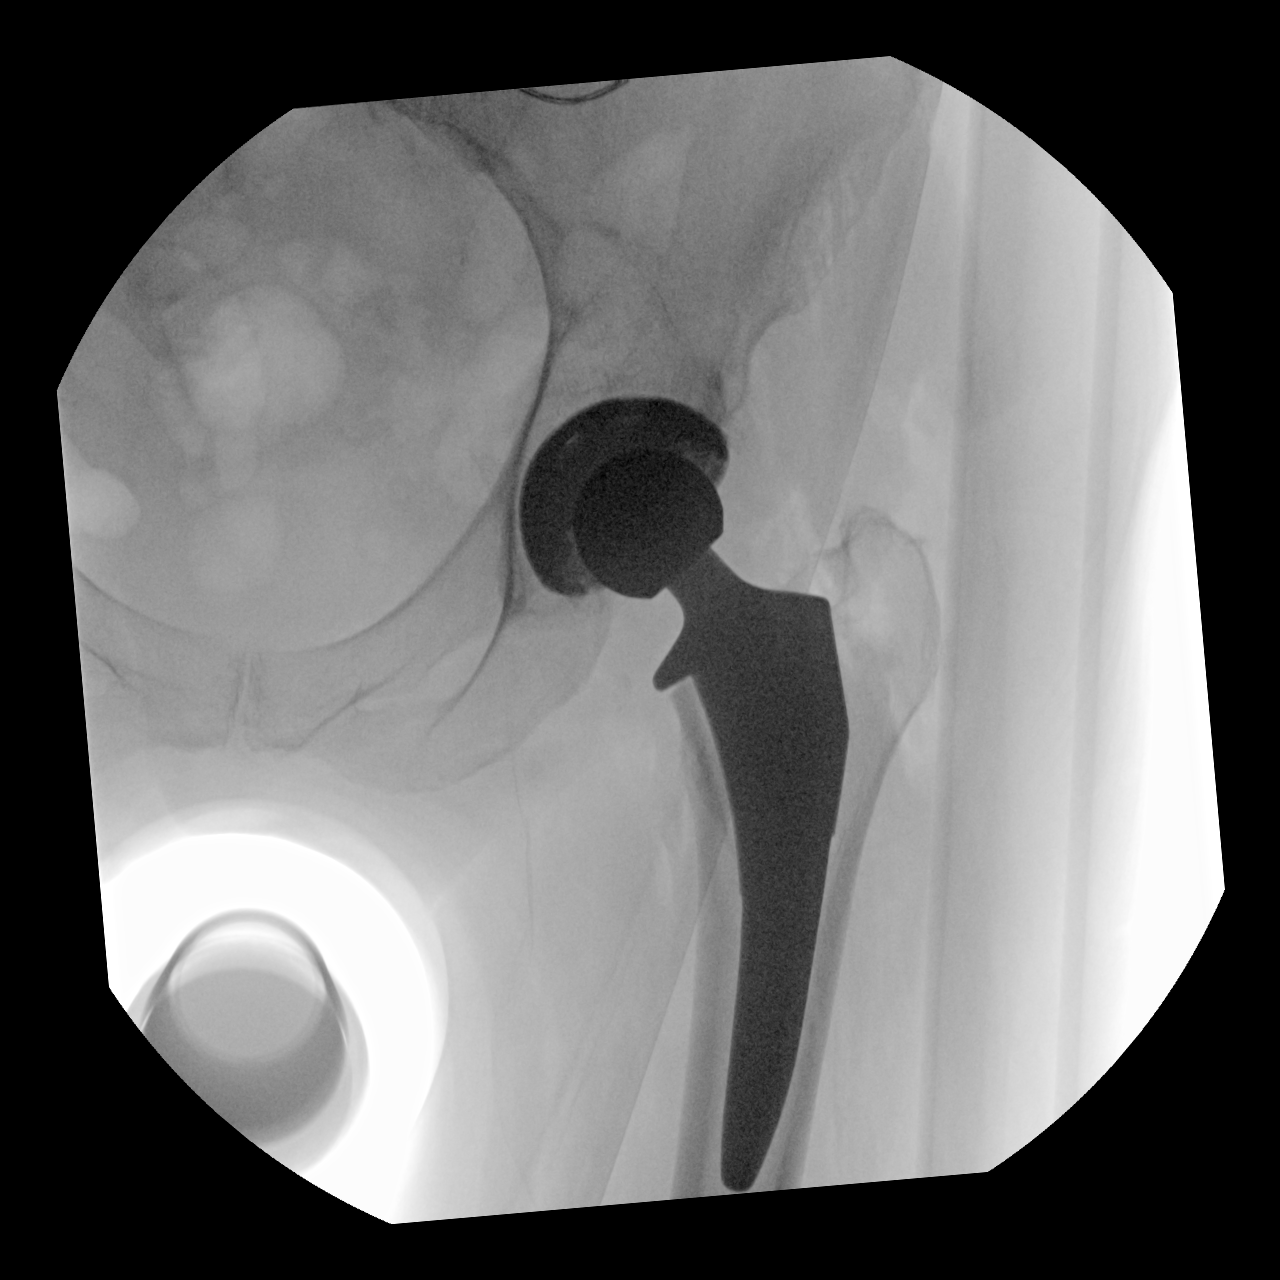
[im 2/2]
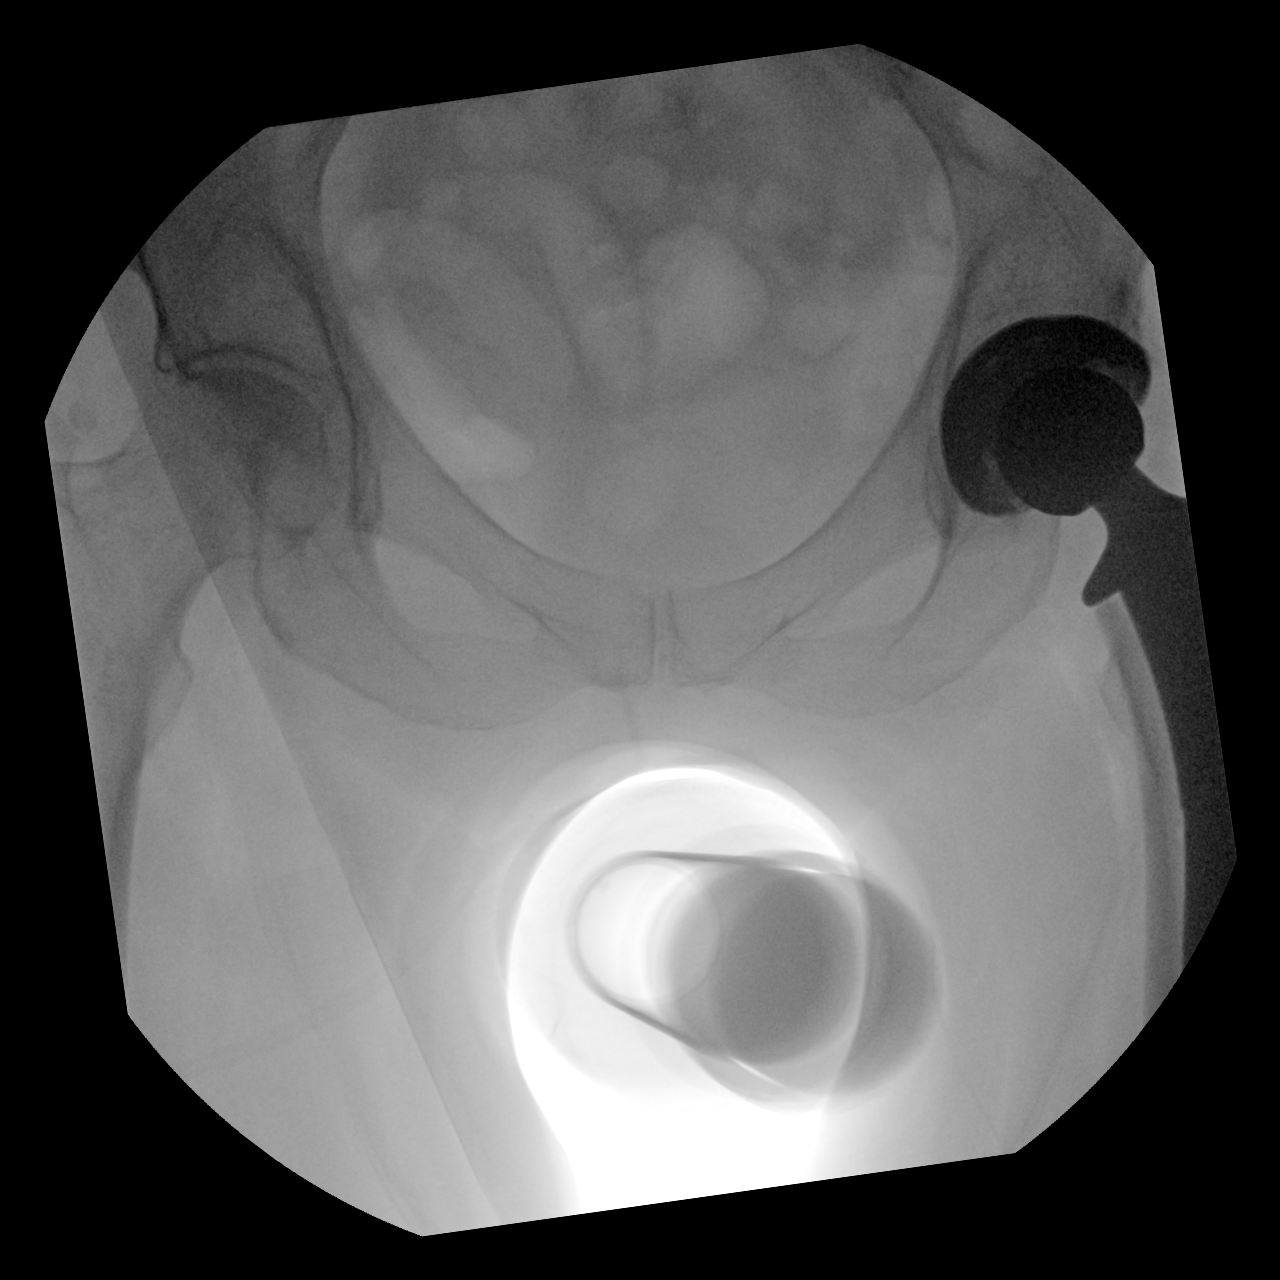

[2 of 2 positions shown; findings below may reference images not displayed]

FINDINGS: LEFT total hip arthroplasty identified without dislocation. No
complicating features are noted.
IMPRESSION: LEFT total hip arthroplasty without complicating features.

## 2021-05-15 ENCOUNTER — Ambulatory Visit (INDEPENDENT_AMBULATORY_CARE_PROVIDER_SITE_OTHER): Payer: Medicare Other | Admitting: Family Medicine

## 2021-05-15 ENCOUNTER — Encounter: Payer: Self-pay | Admitting: Family Medicine

## 2021-05-15 ENCOUNTER — Other Ambulatory Visit: Payer: Self-pay

## 2021-05-15 VITALS — BP 114/68 | HR 70 | Temp 97.7°F | Ht 66.0 in | Wt 168.0 lb

## 2021-05-15 DIAGNOSIS — I10 Essential (primary) hypertension: Secondary | ICD-10-CM | POA: Diagnosis not present

## 2021-05-15 DIAGNOSIS — J4520 Mild intermittent asthma, uncomplicated: Secondary | ICD-10-CM | POA: Diagnosis not present

## 2021-05-15 DIAGNOSIS — Z23 Encounter for immunization: Secondary | ICD-10-CM | POA: Diagnosis not present

## 2021-05-15 DIAGNOSIS — Q383 Other congenital malformations of tongue: Secondary | ICD-10-CM

## 2021-05-15 NOTE — Progress Notes (Signed)
   Ashley Dyer is a 75 y.o. female who presents today for an office visit.  Assessment/Plan:  New/Acute Problems: Tongue Abnormality Normal exam today.  Broad differential however it is encouraging that symptoms seem to be improving.  May be due to her alcohol-based mouthwash that she has stopped using.  Differential also includes HSV, aphthous ulcer, or medication side effect.  She has not been using Flovent and had no signs of thrush-doubt this is contributing.  She is on Maxide which could lead to some electrolyte imbalances.  She will be having her labs checked at cardiology next week.  Given that symptoms are improving we will continue with watchful waiting. If symptoms do not continue to improve would consider empiric trial of Magic mouthwash or referral to ENT.  Chronic Problems Addressed Today: Mild intermittent asthma without complication Stable.  She uses Flovent as needed seasonally and albuterol as needed.  Essential hypertension At goal on Maxide 75-50 and 37.5-25 on alternating days.  She will be having labs done soon per cardiology.  Flu shot given today.    Subjective:  HPI:  Patient here with abnormal sensation to tip of her tongue.  Started a little over a month ago.  She saw her dentist who was concerned about possible burning mouth syndrome.  Her dentist recommended that she stop using alcohol-based mouthwash and change her toothpase.  She has done both of these things.  Symptoms have improved over the last week or so.  She feels like the tip of her tongue is burned.  Had a loss of taste that seems to be coming back.  Had a bit of redness initially but has been away.  No ulcers.  She does have a history of fever blisters and aphthous ulcers.        Objective:  Physical Exam: BP 114/68   Pulse 70   Temp 97.7 F (36.5 C) (Temporal)   Ht 5\' 6"  (1.676 m)   Wt 168 lb (76.2 kg)   SpO2 100%   BMI 27.12 kg/m   Gen: No acute distress, resting comfortably HEENT:  Oral mucosa normal.  No obvious areas of irritation or inflammation.  No ulcers. CV: Regular rate and rhythm with no murmurs appreciated Pulm: Normal work of breathing, clear to auscultation bilaterally with no crackles, wheezes, or rhonchi Neuro: Grossly normal, moves all extremities Psych: Normal affect and thought content      Ashley Dyer M. Jerline Pain, MD 05/15/2021 10:39 AM

## 2021-05-15 NOTE — Assessment & Plan Note (Signed)
At goal on Maxide 75-50 and 37.5-25 on alternating days.  She will be having labs done soon per cardiology.

## 2021-05-15 NOTE — Assessment & Plan Note (Signed)
Stable.  She uses Flovent as needed seasonally and albuterol as needed.

## 2021-05-15 NOTE — Patient Instructions (Signed)
It was very nice to see you today!  I am glad that your symptoms are improving.  Please let us know if they do not continue to improve over the next several weeks.  We will give you your flu shot today.  Take care, Dr Jerline Pain  PLEASE NOTE:  If you had any lab tests please let us know if you have not heard back within a few days. You may see your results on mychart before we have a chance to review them but we will give you a call once they are reviewed by [place]. If we ordered any referrals today, please let us know if you have not heard from their office within the next week.   Please try these tips to maintain a healthy lifestyle:  Eat at least 3 REAL meals and 1-2 snacks per day.  Aim for no more than 5 hours between eating.  If you eat breakfast, please do so within one hour of getting up.   Each meal should contain half fruits/vegetables, one quarter protein, and one quarter carbs (no bigger than a computer mouse)  Cut down on sweet beverages. This includes juice, soda, and sweet tea.   Drink at least 1 glass of water with each meal and aim for at least 8 glasses per day  Exercise at least 150 minutes every week.

## 2021-05-17 ENCOUNTER — Other Ambulatory Visit: Payer: Self-pay | Admitting: Family Medicine

## 2021-05-17 DIAGNOSIS — Z1231 Encounter for screening mammogram for malignant neoplasm of breast: Secondary | ICD-10-CM

## 2021-05-19 ENCOUNTER — Other Ambulatory Visit: Payer: Medicare Other

## 2021-05-22 ENCOUNTER — Other Ambulatory Visit: Payer: Medicare Other | Admitting: *Deleted

## 2021-05-22 ENCOUNTER — Other Ambulatory Visit: Payer: Self-pay

## 2021-05-22 DIAGNOSIS — E7800 Pure hypercholesterolemia, unspecified: Secondary | ICD-10-CM | POA: Diagnosis not present

## 2021-05-22 LAB — LIPID PANEL
Chol/HDL Ratio: 2 ratio (ref 0.0–4.4)
Cholesterol, Total: 145 mg/dL (ref 100–199)
HDL: 71 mg/dL (ref >39)
LDL Chol Calc (NIH): 55 mg/dL (ref 0–99)
Triglycerides: 106 mg/dL (ref 0–149)
VLDL Cholesterol Cal: 19 mg/dL (ref 5–40)

## 2021-05-22 LAB — HEPATIC FUNCTION PANEL
ALT: 22 IU/L (ref 0–32)
AST: 30 IU/L (ref 0–40)
Albumin: 4.6 g/dL (ref 3.7–4.7)
Alkaline Phosphatase: 94 IU/L (ref 44–121)
Bilirubin Total: 0.6 mg/dL (ref 0.0–1.2)
Bilirubin, Direct: 0.16 mg/dL (ref 0.00–0.40)
Total Protein: 7 g/dL (ref 6.0–8.5)

## 2021-05-24 ENCOUNTER — Other Ambulatory Visit: Payer: Self-pay | Admitting: Cardiovascular Disease

## 2021-05-30 ENCOUNTER — Other Ambulatory Visit: Payer: Self-pay | Admitting: Cardiovascular Disease

## 2021-06-08 DIAGNOSIS — L570 Actinic keratosis: Secondary | ICD-10-CM | POA: Diagnosis not present

## 2021-06-08 DIAGNOSIS — L821 Other seborrheic keratosis: Secondary | ICD-10-CM | POA: Diagnosis not present

## 2021-06-08 DIAGNOSIS — L812 Freckles: Secondary | ICD-10-CM | POA: Diagnosis not present

## 2021-06-08 DIAGNOSIS — L738 Other specified follicular disorders: Secondary | ICD-10-CM | POA: Diagnosis not present

## 2021-06-15 DIAGNOSIS — Z1231 Encounter for screening mammogram for malignant neoplasm of breast: Secondary | ICD-10-CM

## 2021-06-20 DIAGNOSIS — Z1231 Encounter for screening mammogram for malignant neoplasm of breast: Secondary | ICD-10-CM

## 2021-08-02 ENCOUNTER — Telehealth: Payer: Self-pay | Admitting: Family Medicine

## 2021-08-02 NOTE — Telephone Encounter (Signed)
Copied from [place] [id number]. Topic: Medicare AWV >> Aug 02, 2021 10:26 AM Harris-Coley, Hannah Beat wrote: Reason for CRM: Left message for patient to schedule Annual Wellness Visit.  Please schedule with Nurse Health Advisor Charlott Rakes, RN at [facility].  Please call [phone] ask for [facility]

## 2021-08-22 ENCOUNTER — Ambulatory Visit
Admission: RE | Admit: 2021-08-22 | Discharge: 2021-08-22 | Disposition: A | Discharge status: Home / Self Care | Payer: Medicare Other | Source: Ambulatory Visit | Attending: Family Medicine | Admitting: Family Medicine

## 2021-08-22 DIAGNOSIS — Z1231 Encounter for screening mammogram for malignant neoplasm of breast: Secondary | ICD-10-CM

## 2021-08-27 ENCOUNTER — Encounter: Payer: Self-pay | Admitting: Cardiovascular Disease

## 2021-08-27 NOTE — Progress Notes (Signed)
Cardiology Office Note:    Date:  08/28/2021   ID:  Ashley Dyer, DOB 11-20-1945, MRN [id number]  PCP:  Vivi Barrack, MD  Cardiologist:  Mertie Moores, MD , previouis [facility]onia Lawman patient  Electrophysiologist:  None   Referring MD: Vivi Barrack, MD   Chief Complaint  Patient presents with   Hypertension    February 16, 2019     Ashley Dyer is a 76 y.o. female with a hx of HTN, hyperlipidemia, asthma.  She is been followed by Dr. Wynonia Lawman in the past.  She was seen in January 05, 2017 by Beckie Busing, nurse practitioner for preoperative evaluation prior to shoulder surgery.  She has been intolerant to statin medications.  She is been on Praluent with good results. Still having leg cramps .   Is on a diuretic .  Recent left shoulder surgery on January 22, 2019 .  To start rehab in several weeks .   Exercises regularly   August 20, 2019:  Ashley Dyer is seen today for follow-up of her hyperlipidemia and hypertension. Feeling well Left hip has healed.  Getting back into exercise .  She is doing well on her Repatha.  She needs a refill.  She is a transfer from Dr. Wynonia Lawman and has not been seen in our lipid clinic yet.  We will make an appointment for her to be in our lipid clinic and get that refilled.  June 14, 2020: Ashley Dyer is seen today for follow-up of her hyperlipidemia, hypertension.  She is doing well on her Repatha. She is a previous patient of Dr. Wynonia Lawman. Has some occasional ankle edema ,  Has gained some weight .  Wt. Today is 169 lbs  Is waking quite   Jan. 23, 2023: Ashley Dyer is seen today for follow up of her HLD, HTN.  She was seen by Richardson Dopp, PA in Jan. 2022  CT cardiac scoring 07/24/2017 IMPRESSION: 1. Three-vessel Coronary artery calcification. 2. Total Agatston Score: 559 3. MESA database percentile:  91 nd percentile  She is on pralulent f or her HLD .  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   Walking pneumonia     Past Surgical History:  Procedure Laterality Date   CATARACT EXTRACTION Bilateral    EYE SURGERY     KIDNEY STONE SURGERY     NEPHROLITHOTOMY Right 10/22/2019   Procedure: RIGHT NEPHROLITHOTOMY PERCUTANEOUS WITH SURGEON ACCESS;  Surgeon: Ardis Hughs, MD;  Location: [facility];  Service: Urology;  Laterality: Right;   TONSILLECTOMY     TOTAL HIP ARTHROPLASTY Left 05/11/2019   Procedure: LEFT TOTAL HIP ARTHROPLASTY ANTERIOR APPROACH;  Surgeon: Frederik Pear, MD;  Location: [facility];  Service: Orthopedics;  Laterality: Left;   TOTAL SHOULDER ARTHROPLASTY Right 06/13/2017   TOTAL SHOULDER ARTHROPLASTY Right 06/13/2017   Procedure: TOTAL SHOULDER ARTHROPLASTY;  Surgeon: Tania Ade, MD;  Location: [facility];  Service: Orthopedics;  Laterality: Right;  Right total shoulder arthroplasty   TOTAL SHOULDER ARTHROPLASTY Left 01/22/2019  Procedure: TOTAL SHOULDER ARTHROPLASTY;  Surgeon: Tania Ade, MD;  Location: [facility];  Service: Orthopedics;  Laterality: Left;   VAGINAL DELIVERY  1976    Current Medications: Current Meds  Medication Sig   Alirocumab (PRALUENT) 150 MG/ML SOAJ Inject 1 pen into the skin every 14 (fourteen) days.   Ascorbic Acid (VITAMIN C PO) Take 1 tablet by mouth daily.   ASHWAGANDHA PO Take 1,000 mg by mouth in the morning and at bedtime.   CALCIUM CITRATE PO Take 1 tablet by mouth daily.    Cholecalciferol (VITAMIN D) 50 MCG (2000 UT) tablet Take 4,000 Units by mouth daily.   Coenzyme Q10 (COQ-10 PO) Take 1 capsule by mouth daily.   cromolyn (NASALCROM) 5.2 MG/ACT nasal spray Place 1 spray into both nostrils 2 (two) times  daily.   estradiol (ESTRACE) 0.1 MG/GM vaginal cream Place 1 g vaginally 2 (two) times a week.    ezetimibe (ZETIA) 10 MG tablet Take 1 tablet (10 mg total) by mouth daily.   FLOVENT HFA 110 MCG/ACT inhaler INHALE 1 PUFF INTO THE LUNGS 2 TIMES DAILY AS NEEDED FOR SHORTNESS OF BREATH OR WHEEZE   Ibuprofen-Acetaminophen 125-250 MG TABS Take 2 tablets by mouth daily as needed (pain).   losartan (COZAAR) 50 MG tablet Take 1 tablet (50 mg total) by mouth daily. Please keep upcoming appt. In Jan. In order to receive future refills. Thank you.   triamterene-hydrochlorothiazide (MAXZIDE) 75-50 MG tablet TAKE 1 TABLET BY MOUTH EVERY OTHER DAY ALTERNATING WITH 1/2 TABLET THE OTHER DAYS     Allergies:   Patient has no known allergies.   Social History   Socioeconomic History   Marital status: Single    Spouse name: Not on file   Number of children: Not on file   Years of education: Not on file   Highest education level: Not on file  Occupational History   Not on file  Tobacco Use   Smoking status: Former    Types: Cigarettes    Quit date: 74    Years since quitting: 33.0   Smokeless tobacco: Never  Vaping Use   Vaping Use: Never used  Substance and Sexual Activity   Alcohol use: Yes    Alcohol/week: 8.0 - 10.0 standard drinks    Types: 8 - 10 Glasses of wine per week    Comment: week    Drug use: No   Sexual activity: Not Currently  Other Topics Concern   Not on file  Social History Narrative   Not on file   Social Determinants of Health   Financial Resource Strain: Not on file  Food Insecurity: Not on file  Transportation Needs: Not on file  Physical Activity: Not on file  Stress: Not on file  Social Connections: Not on file     Family History: The patient's family history includes Atrial fibrillation in her mother; Colon cancer in her paternal grandfather; Heart disease in her father; Leukemia in her brother. There is no history of Breast cancer.  ROS:   Please see the  history of present illness.     All other systems reviewed and are negative.  EKGs/Labs/Other Studies Reviewed:    The following studies were reviewed today:   Recent Labs: 08/30/2020: BUN 23; Creatinine, Ser 0.77; Potassium 3.8; Sodium 139 05/22/2021: ALT 22  Recent Lipid Panel    Component Value Date/Time   CHOL 145 05/22/2021 0754   TRIG 106 05/22/2021 0754   HDL 71 05/22/2021 0754   CHOLHDL  2.0 05/22/2021 0754   LDLCALC 55 05/22/2021 0754    Physical Exam:    Physical Exam: Blood pressure 132/74, pulse 77, height 5\' 6"  (1.676 m), weight 171 lb 3.2 oz (77.7 kg), SpO2 97 %.  GEN:  Well nourished, well developed in no acute distress HEENT: Normal NECK: No JVD; No carotid bruits LYMPHATICS: No lymphadenopathy CARDIAC: RRR   RESPIRATORY:  Clear to auscultation without rales, wheezing or rhonchi  ABDOMEN: Soft, non-tender, non-distended MUSCULOSKELETAL:  No edema; No deformity  SKIN: Warm and dry NEUROLOGIC:  Alert and oriented x 3    EKG: August 28, 2021: Normal sinus rhythm 77.  No ST or T wave changes.      ASSESSMENT:    1. Coronary artery calcification   2. Statin myopathy     PLAN:      Hyperlipidemia:    Continue Praluent.  Lipid levels look good.   2.  Hypertension:   Blood pressure is well controlled.   3.  CAD : No angina.  Continue to monitor   Medication Adjustments/Labs and Tests Ordered: Current medicines are reviewed at length with the patient today.  Concerns regarding medicines are outlined above.  No orders of the defined types were placed in this encounter.  No orders of the defined types were placed in this encounter.   There are no Patient Instructions on file for this visit.   Signed, Mertie Moores, MD  08/28/2021 11:26 AM    [place]

## 2021-08-28 ENCOUNTER — Other Ambulatory Visit: Payer: Self-pay

## 2021-08-28 ENCOUNTER — Encounter: Payer: Self-pay | Admitting: Cardiovascular Disease

## 2021-08-28 ENCOUNTER — Ambulatory Visit: Payer: Medicare Other | Admitting: Cardiovascular Disease

## 2021-08-28 VITALS — BP 132/74 | HR 77 | Ht 66.0 in | Wt 171.2 lb

## 2021-08-28 DIAGNOSIS — G720 Drug-induced myopathy: Secondary | ICD-10-CM | POA: Diagnosis not present

## 2021-08-28 DIAGNOSIS — I2584 Coronary atherosclerosis due to calcified coronary lesion: Secondary | ICD-10-CM

## 2021-08-28 DIAGNOSIS — T466X5A Adverse effect of antihyperlipidemic and antiarteriosclerotic drugs, initial encounter: Secondary | ICD-10-CM

## 2021-08-28 DIAGNOSIS — I2510 Atherosclerotic heart disease of native coronary artery without angina pectoris: Secondary | ICD-10-CM

## 2021-08-28 DIAGNOSIS — T466X5D Adverse effect of antihyperlipidemic and antiarteriosclerotic drugs, subsequent encounter: Secondary | ICD-10-CM | POA: Diagnosis not present

## 2021-08-28 NOTE — Patient Instructions (Signed)
Medication Instructions:  Your physician recommends that you continue on your current medications as directed. Please refer to the Current Medication list given to you today.  *If you need a refill on your cardiac medications before your next appointment, please call your pharmacy*   Lab Work: None ordered  If you have labs (blood work) drawn today and your tests are completely normal, you will receive your results only by: MyChart Message (if you have MyChart) OR A paper copy in the mail If you have any lab test that is abnormal or we need to change your treatment, we will call you to review the results.   Testing/Procedures: None ordered   Follow-Up: At [facility], you and your health needs are our priority.  As part of our continuing mission to provide you with exceptional heart care, we have created designated Provider Care Teams.  These Care Teams include your primary Cardiologist (physician) and Advanced Practice Providers (APPs -  Physician Assistants and Nurse Practitioners) who all work together to provide you with the care you need, when you need it.  We recommend signing up for the patient portal called "MyChart".  Sign up information is provided on this After Visit Summary.  MyChart is used to connect with patients for Virtual Visits (Telemedicine).  Patients are able to view lab/test results, encounter notes, upcoming appointments, etc.  Non-urgent messages can be sent to your provider as well.   To learn more about what you can do with MyChart, go to [email or web address].com.    Your next appointment:   12 month(s)  The format for your next appointment:   In Person  Provider:       Other Instructions   

## 2021-10-05 ENCOUNTER — Other Ambulatory Visit: Payer: Self-pay | Admitting: Cardiovascular Disease

## 2021-10-23 ENCOUNTER — Telehealth: Payer: Self-pay | Admitting: Family Medicine

## 2021-10-23 NOTE — Telephone Encounter (Signed)
Spoke with patient and she declined AWV  ?

## 2021-11-09 ENCOUNTER — Other Ambulatory Visit: Payer: Self-pay | Admitting: Cardiovascular Disease

## 2021-12-26 ENCOUNTER — Other Ambulatory Visit: Payer: Self-pay | Admitting: Pharmacist

## 2021-12-26 ENCOUNTER — Encounter: Payer: Self-pay | Admitting: Cardiovascular Disease

## 2021-12-26 MED ORDER — PRALUENT 150 MG/ML ~~LOC~~ SOAJ: 1.0000 "pen " | SUBCUTANEOUS | 11 refills | Status: DC

## 2022-02-03 ENCOUNTER — Other Ambulatory Visit: Payer: Self-pay | Admitting: Physician Assistant

## 2022-03-23 ENCOUNTER — Ambulatory Visit (INDEPENDENT_AMBULATORY_CARE_PROVIDER_SITE_OTHER): Payer: Medicare Other | Admitting: Family Medicine

## 2022-03-23 VITALS — BP 127/71 | HR 77 | Temp 98.3°F | Ht 66.0 in | Wt 167.4 lb

## 2022-03-23 DIAGNOSIS — I10 Essential (primary) hypertension: Secondary | ICD-10-CM

## 2022-03-23 DIAGNOSIS — N200 Calculus of kidney: Secondary | ICD-10-CM | POA: Diagnosis not present

## 2022-03-23 DIAGNOSIS — E785 Hyperlipidemia, unspecified: Secondary | ICD-10-CM

## 2022-03-23 DIAGNOSIS — E559 Vitamin D deficiency, unspecified: Secondary | ICD-10-CM

## 2022-03-23 DIAGNOSIS — N390 Urinary tract infection, site not specified: Secondary | ICD-10-CM | POA: Diagnosis not present

## 2022-03-23 DIAGNOSIS — R739 Hyperglycemia, unspecified: Secondary | ICD-10-CM

## 2022-03-23 LAB — POCT URINALYSIS DIPSTICK
Bilirubin, UA: NEGATIVE
Blood, UA: POSITIVE
Glucose, UA: NEGATIVE
Ketones, UA: NEGATIVE
Leukocytes, UA: NEGATIVE
Nitrite, UA: NEGATIVE
Protein, UA: NEGATIVE
Spec Grav, UA: 1.015 (ref 1.010–1.025)
Urobilinogen, UA: 0.2 E.U./dL
pH, UA: 6 (ref 5.0–8.0)

## 2022-03-23 NOTE — Assessment & Plan Note (Signed)
Blood pressure is at goal on Maxide 75-50 and 37.5-25 in alternating days.  We will check labs today.

## 2022-03-23 NOTE — Patient Instructions (Signed)
It was very nice to see you today!  I think you have another kidney stone.  We can give this a few weeks to see if it will pass on its own.  Please let us know if your symptoms worsen or do not improve.  We will check urine culture to make sure that you do not have a UTI.  We will check blood work today as well.    Take care, Dr Jerline Pain  PLEASE NOTE:  If you had any lab tests please let us know if you have not heard back within a few days. You may see your results on mychart before we have a chance to review them but we will give you a call once they are reviewed by [place]. If we ordered any referrals today, please let us know if you have not heard from their office within the next week.   Please try these tips to maintain a healthy lifestyle:  Eat at least 3 REAL meals and 1-2 snacks per day.  Aim for no more than 5 hours between eating.  If you eat breakfast, please do so within one hour of getting up.   Each meal should contain half fruits/vegetables, one quarter protein, and one quarter carbs (no bigger than a computer mouse)  Cut down on sweet beverages. This includes juice, soda, and sweet tea.   Drink at least 1 glass of water with each meal and aim for at least 8 glasses per day  Exercise at least 150 minutes every week.

## 2022-03-23 NOTE — Progress Notes (Signed)
   Ashley Dyer is a 76 y.o. female who presents today for an office visit.  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  We discussed treatment options including watchful waiting versus trial of Flomax.  She has been on Flomax in the past and did not feel like this was very beneficial.  She currently appears well and is mostly comfortable and she is fine waiting another 1 to 2 weeks to see if this will pass spontaneously.  I believe this is reasonable given her overall clinical picture.  If her symptoms are not improving in the next 1 to 2 weeks would consider referral back to urology versus imaging at that time.  Urine culture is pending to rule out UTI.  Fatigue No red flags.  Possibly secondary to kidney stone possible UTI.  We will check labs today including CBC, c-Met, TSH, and vitamin D.  Chronic Problems Addressed Today: Nephrolithiasis Likely source of her hematuria and back pain.  We will be treating as above.  She had stone analysis done by urology a couple of years ago.  She is not sure what type of stone but she was told several foods to avoid.  She is currently on Maxide which would help prevent calcium oxalate stones.  Dyslipidemia We will check lipids today.  She is on Zetia 10 mg daily.  Essential hypertension Blood pressure is at goal on Maxide 75-50 and 37.5-25 in alternating days.  We will check labs today.     Subjective:  HPI:  See A/p for status of chronic conditions.  She is here today with low abdominal pain and bloating as well as some back pain.  She has noticed some urinary pressure.  She is also noticed fatigue for the last few days.  She does have a history of kidney stones and is worried about possible recurrence.  No nausea or vomiting.  No fevers or chills.  No dysuria.        Objective:  Physical Exam: BP 127/71   Pulse 77   Temp 98.3 F (36.8 C) (Temporal)   Ht $R'5\' 6"'sr$  (1.676 m)   Wt  167 lb 6.4 oz (75.9 kg)   SpO2 97%   BMI 27.02 kg/m   Gen: No acute distress, resting comfortably Neuro: Grossly normal, moves all extremities Psych: Normal affect and thought content      Currie Dennin M. Jerline Pain, MD 03/23/2022 4:01 PM

## 2022-03-23 NOTE — Assessment & Plan Note (Signed)
We will check lipids today.  She is on Zetia 10 mg daily.

## 2022-03-23 NOTE — Assessment & Plan Note (Addendum)
Likely source of her hematuria and back pain.  We will be treating as above.  She had stone analysis done by urology a couple of years ago.  She is not sure what type of stone but she was told several foods to avoid.  She is currently on Maxide which would help prevent calcium oxalate stones.

## 2022-03-25 LAB — COMPREHENSIVE METABOLIC PANEL
AG Ratio: 1.6 (calc) (ref 1.0–2.5)
ALT: 20 U/L (ref 6–29)
AST: 29 U/L (ref 10–35)
Albumin: 4.6 g/dL (ref 3.6–5.1)
Alkaline phosphatase (APISO): 90 U/L (ref 37–153)
BUN/Creatinine Ratio: 31 (calc) — ABNORMAL HIGH (ref 6–22)
BUN: 26 mg/dL — ABNORMAL HIGH (ref 7–25)
CO2: 23 mmol/L (ref 20–32)
Calcium: 10.1 mg/dL (ref 8.6–10.4)
Chloride: 100 mmol/L (ref 98–110)
Creat: 0.83 mg/dL (ref 0.60–1.00)
Globulin: 2.8 g/dL (calc) (ref 1.9–3.7)
Glucose, Bld: 93 mg/dL (ref 65–99)
Potassium: 4 mmol/L (ref 3.5–5.3)
Sodium: 137 mmol/L (ref 135–146)
Total Bilirubin: 0.4 mg/dL (ref 0.2–1.2)
Total Protein: 7.4 g/dL (ref 6.1–8.1)

## 2022-03-25 LAB — TSH: TSH: 2.52 mIU/L (ref 0.40–4.50)

## 2022-03-25 LAB — CBC
HCT: 42.5 % (ref 35.0–45.0)
Hemoglobin: 14.6 g/dL (ref 11.7–15.5)
MCH: 32.1 pg (ref 27.0–33.0)
MCHC: 34.4 g/dL (ref 32.0–36.0)
MCV: 93.4 fL (ref 80.0–100.0)
MPV: 11.4 fL (ref 7.5–12.5)
Platelets: 241 10*3/uL (ref 140–400)
RBC: 4.55 10*6/uL (ref 3.80–5.10)
RDW: 12.6 % (ref 11.0–15.0)
WBC: 8.4 10*3/uL (ref 3.8–10.8)

## 2022-03-25 LAB — LIPID PANEL
Cholesterol: 176 mg/dL (ref <200)
HDL: 63 mg/dL (ref >=50)
LDL Cholesterol (Calc): 74 mg/dL (calc)
Non-HDL Cholesterol (Calc): 113 mg/dL (calc) (ref <130)
Total CHOL/HDL Ratio: 2.8 (calc) (ref <5.0)
Triglycerides: 322 mg/dL — ABNORMAL HIGH (ref <150)

## 2022-03-25 LAB — HEMOGLOBIN A1C
Hgb A1c MFr Bld: 5.6 % of total Hgb (ref <5.7)
Mean Plasma Glucose: 114 mg/dL
eAG (mmol/L): 6.3 mmol/L

## 2022-03-25 LAB — URINE CULTURE
MICRO NUMBER:: 13800074
SPECIMEN QUALITY:: ADEQUATE

## 2022-03-25 LAB — VITAMIN D 25 HYDROXY (VIT D DEFICIENCY, FRACTURES): Vit D, 25-Hydroxy: 55 ng/mL (ref 30–100)

## 2022-03-27 ENCOUNTER — Encounter: Payer: Self-pay | Admitting: Family Medicine

## 2022-03-27 NOTE — Progress Notes (Signed)
Please inform patient of the following:  Urine culture is negative.  She does not have a UTI.  I believe her pain is probably from a kidney stone.  Would like for her to let us know if symptoms or not improving in the next couple of weeks.  Blood work shows that she is a little bit dehydrated.  She should make sure that she is getting plenty of fluids.  This will also help with kidney stone passage.  Her triglycerides were little bit elevated however she was not fasting this is not an accurate reading.  We can recheck again in a year.  The rest of her cholesterol levels are normal.

## 2022-03-27 NOTE — Telephone Encounter (Signed)
See results note. 

## 2022-03-28 ENCOUNTER — Encounter: Payer: Self-pay | Admitting: Family Medicine

## 2022-03-28 NOTE — Telephone Encounter (Signed)
Spoke with patient, patient will call to schedule appointment with urology  Requesting lab to be send prior to schedule appointment

## 2022-03-29 DIAGNOSIS — N202 Calculus of kidney with calculus of ureter: Secondary | ICD-10-CM | POA: Diagnosis not present

## 2022-04-25 ENCOUNTER — Telehealth: Payer: Self-pay | Admitting: *Deleted

## 2022-04-25 NOTE — Patient Outreach (Signed)
  Care Coordination   04/25/2022 Name: Ashley Dyer MRN: [id number] DOB: 1946-06-19   Care Coordination Outreach Attempts:  An unsuccessful telephone outreach was attempted today to offer the patient information about available care coordination services as a benefit of their health plan.   Follow Up Plan:  Additional outreach attempts will be made to offer the patient care coordination information and services.   Encounter Outcome:  No Answer  Care Coordination Interventions Activated:  No   Care Coordination Interventions:  No, not indicated    Raina Mina, RN Care Management Coordinator [place] Office [phone]

## 2022-04-27 DIAGNOSIS — M25551 Pain in right hip: Secondary | ICD-10-CM | POA: Diagnosis not present

## 2022-05-08 ENCOUNTER — Telehealth: Payer: Self-pay | Admitting: *Deleted

## 2022-05-08 NOTE — Patient Outreach (Signed)
  Care Coordination   05/08/2022 Name: Ashley Dyer MRN: [id number] DOB: [birth year]   Care Coordination Outreach Attempts:  A second unsuccessful outreach was attempted today to offer the patient with information about available care coordination services as a benefit of their health plan.     Follow Up Plan:  Additional outreach attempts will be made to offer the patient care coordination information and services.   Encounter Outcome:  No Answer  Care Coordination Interventions Activated:  No   Care Coordination Interventions:  No, not indicated    Raina Mina, RN Care Management Coordinator [place] Office [phone]

## 2022-05-21 ENCOUNTER — Telehealth: Payer: Self-pay

## 2022-05-21 NOTE — Patient Outreach (Signed)
  Care Coordination   05/21/2022 Name: Ashley Dyer MRN: [id number] DOB: 1945-12-18   Care Coordination Outreach Attempts:  A third unsuccessful outreach was attempted today to offer the patient with information about available care coordination services as a benefit of their health plan.   Follow Up Plan:  No further outreach attempts will be made at this time. We have been unable to contact the patient to offer or enroll patient in care coordination services  Encounter Outcome:  No Answer  Care Coordination Interventions Activated:  No   Care Coordination Interventions:  No, not indicated    Peter Garter RN, BSN,CCM, [place] [facility] [phone]

## 2022-06-18 DIAGNOSIS — L814 Other melanin hyperpigmentation: Secondary | ICD-10-CM | POA: Diagnosis not present

## 2022-06-18 DIAGNOSIS — D225 Melanocytic nevi of trunk: Secondary | ICD-10-CM | POA: Diagnosis not present

## 2022-06-18 DIAGNOSIS — D2262 Melanocytic nevi of left upper limb, including shoulder: Secondary | ICD-10-CM | POA: Diagnosis not present

## 2022-06-18 DIAGNOSIS — L821 Other seborrheic keratosis: Secondary | ICD-10-CM | POA: Diagnosis not present

## 2022-06-18 DIAGNOSIS — L570 Actinic keratosis: Secondary | ICD-10-CM | POA: Diagnosis not present

## 2022-06-18 DIAGNOSIS — D2261 Melanocytic nevi of right upper limb, including shoulder: Secondary | ICD-10-CM | POA: Diagnosis not present

## 2022-06-28 ENCOUNTER — Other Ambulatory Visit: Payer: Self-pay | Admitting: Cardiovascular Disease

## 2022-07-30 ENCOUNTER — Other Ambulatory Visit: Payer: Self-pay | Admitting: Cardiovascular Disease

## 2022-08-08 ENCOUNTER — Other Ambulatory Visit: Payer: Self-pay | Admitting: Family Medicine

## 2022-08-08 DIAGNOSIS — Z1231 Encounter for screening mammogram for malignant neoplasm of breast: Secondary | ICD-10-CM

## 2022-08-13 DIAGNOSIS — L820 Inflamed seborrheic keratosis: Secondary | ICD-10-CM | POA: Diagnosis not present

## 2022-08-16 ENCOUNTER — Other Ambulatory Visit (HOSPITAL_COMMUNITY): Payer: Self-pay

## 2022-08-22 ENCOUNTER — Telehealth: Payer: Self-pay | Admitting: Cardiovascular Disease

## 2022-08-22 NOTE — Telephone Encounter (Signed)
Pt would like a callback regarding whether she needs labs done before coming in for annual appt. Please advise

## 2022-08-24 NOTE — Telephone Encounter (Signed)
Returned call to patient and left message on cell voicemail per DPR informing patient that we shouldn't need to do any additional labs prior to her visit. We are able to use the labs she had done in chart on 03/23/22.

## 2022-08-31 ENCOUNTER — Encounter: Payer: Self-pay | Admitting: Family Medicine

## 2022-09-05 NOTE — Progress Notes (Signed)
Cardiology Office Note:    Date:  09/11/2022   ID:  Ashley Dyer, DOB [birth year], MRN [id number]  PCP:  Vivi Barrack, MD   [facility] Providers Cardiologist:  Mertie Moores, MD Cardiology APP:  Emmaline Life, NP     Referring MD: Vivi Barrack, MD   Chief Complaint: follow-up elevated coronary calcium score  History of Present Illness:    Ashley Dyer is a very pleasant 77 y.o. female with a hx of HTN, hyperlipidemia with statin intolerance, three-vessel coronary artery calcification with elevated calcium score of 559 on CT 07/2017  Previous patient of Dr. Wynonia Lawman, she has been followed more recently by Dr. Acie Fredrickson and has maintained consistent follow-up. Good LDL response to Repatha and tolerating it well.   Last cardiology clinic visit was 08/28/21 with Dr. Acie Fredrickson at which time she reported that she remained active with exercise. No changes were made to her treatment regimen and she was advised to return in 1 year for follow-up.  Today, she is here alone for follow-up. Reports she is feeling well and continues to exercise on a consistent basis.  Walks 2.5 to 3 miles 5 days/week and additionally does some stretching.  [place] and does not have any problems climbing them. Home SBP 120-130 mmHg. She denies chest pain, shortness of breath, lower extremity edema, fatigue, palpitations, melena, hematuria, hemoptysis, diaphoresis, weakness, presyncope, syncope, orthopnea, and PND. She likes to eat more plant based options but when she cooks for herself and her boyfriend she generally cooks more meat.   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   Walking pneumonia     Past Surgical History:  Procedure Laterality Date   CATARACT EXTRACTION Bilateral    EYE SURGERY     KIDNEY STONE SURGERY     NEPHROLITHOTOMY Right 10/22/2019   Procedure: RIGHT NEPHROLITHOTOMY PERCUTANEOUS WITH SURGEON ACCESS;  Surgeon: Ardis Hughs, MD;  Location: [facility];  Service: Urology;  Laterality: Right;   TONSILLECTOMY     TOTAL HIP ARTHROPLASTY Left 05/11/2019   Procedure: LEFT TOTAL HIP ARTHROPLASTY ANTERIOR APPROACH;  Surgeon: Frederik Pear, MD;  Location: [facility];  Service: Orthopedics;  Laterality: Left;   TOTAL SHOULDER ARTHROPLASTY Right 06/13/2017   TOTAL SHOULDER ARTHROPLASTY Right 06/13/2017   Procedure: TOTAL SHOULDER ARTHROPLASTY;  Surgeon: Tania Ade, MD;  Location: [facility];  Service: Orthopedics;  Laterality: Right;  Right total shoulder arthroplasty   TOTAL SHOULDER ARTHROPLASTY Left 01/22/2019   Procedure: TOTAL SHOULDER ARTHROPLASTY;  Surgeon: Tania Ade, MD;  Location: [facility];  Service: Orthopedics;  Laterality: Left;   VAGINAL DELIVERY  1976    Current Medications: Current Meds  Medication Sig   Alirocumab (PRALUENT) 150 MG/ML SOAJ Inject 1 pen. into the skin every 14 (fourteen) days.   ASHWAGANDHA PO Take 1,000 mg by mouth in the morning and at bedtime.   Cholecalciferol (VITAMIN D) 50 MCG (2000 UT) tablet Take 4,000 Units by mouth daily.   Coenzyme Q10 (COQ-10 PO) Take 1 capsule by mouth daily.  cromolyn (NASALCROM) 5.2 MG/ACT nasal spray Place 1 spray into both nostrils 2 (two) times daily.   estradiol (ESTRACE) 0.1 MG/GM vaginal cream Place 1 g vaginally 2 (two) times a week.    ezetimibe (ZETIA) 10 MG tablet Take 1 tablet by mouth once daily   FLOVENT HFA 110 MCG/ACT inhaler INHALE 1 PUFF INTO THE LUNGS 2 TIMES DAILY AS NEEDED FOR SHORTNESS OF BREATH OR WHEEZE   Ibuprofen-Acetaminophen 125-250 MG TABS Take 2 tablets by mouth daily as needed (pain).   losartan (COZAAR) 50 MG tablet Take 1 tablet (50 mg  total) by mouth daily. Please keep upcoming appt. In Jan. In order to receive future refills. Thank you.   triamterene-hydrochlorothiazide (MAXZIDE) 75-50 MG tablet TAKE 1 TABLET BY MOUTH EVERY OTHER DAY ALTERNATING WITH 1/2 TABLET THE OTHER DAYS     Allergies:   Patient has no known allergies.   Social History   Socioeconomic History   Marital status: Single    Spouse name: Not on file   Number of children: Not on file   Years of education: Not on file   Highest education level: Not on file  Occupational History   Not on file  Tobacco Use   Smoking status: Former    Types: Cigarettes    Quit date: 1990    Years since quitting: 34.1   Smokeless tobacco: Never  Vaping Use   Vaping Use: Never used  Substance and Sexual Activity   Alcohol use: Yes    Alcohol/week: 8.0 - 10.0 standard drinks of alcohol    Types: 8 - 10 Glasses of wine per week    Comment: week    Drug use: No   Sexual activity: Not Currently  Other Topics Concern   Not on file  Social History Narrative   Not on file   Social Determinants of Health   Financial Resource Strain: Not on file  Food Insecurity: Not on file  Transportation Needs: Not on file  Physical Activity: Not on file  Stress: Not on file  Social Connections: Not on file     Family History: The patient's family history includes Atrial fibrillation in her mother; Colon cancer in her paternal grandfather; Heart disease in her father; Leukemia in her brother. There is no history of Breast cancer.  ROS:   Please see the history of present illness.   All other systems reviewed and are negative.  Labs/Other Studies Reviewed:    The following studies were reviewed today:  CT Cardiac Score 07/2017 Coronary artery calcifications within the LAD, circumflex, LEFT main and RIGHT coronary artery.  IMPRESSION: 1. Three-vessel Coronary artery calcification. 2. Total Agatston Score: 559 3. MESA database percentile:  92 nd  percentile   Recent Labs: 03/23/2022: ALT 20; BUN 26; Creat 0.83; Hemoglobin 14.6; Platelets 241; Potassium 4.0; Sodium 137; TSH 2.52  Recent Lipid Panel    Component Value Date/Time   CHOL 176 03/23/2022 1604   CHOL 145 05/22/2021 0754   TRIG 322 (H) 03/23/2022 1604   HDL 63 03/23/2022 1604   HDL 71 05/22/2021 0754   CHOLHDL 2.8 03/23/2022 1604   LDLCALC 74 03/23/2022 1604     Risk Assessment/Calculations:       Physical Exam:    VS:  BP 131/75   Pulse 72   Ht '5\' 6"'$  (1.676 m)   Wt 165 lb (74.8 kg)   SpO2 95%   BMI 26.63 kg/m     Wt Readings from Last 3 Encounters:  09/11/22  165 lb (74.8 kg)  03/23/22 167 lb 6.4 oz (75.9 kg)  08/28/21 171 lb 3.2 oz (77.7 kg)     GEN:  Well nourished, well developed in no acute distress HEENT: Normal NECK: No JVD; No carotid bruits CARDIAC: RRR, no murmurs, rubs, gallops RESPIRATORY:  Clear to auscultation without rales, wheezing or rhonchi  ABDOMEN: Soft, non-tender, non-distended MUSCULOSKELETAL:  No edema; No deformity. 2+ pedal pulses, equal bilaterally SKIN: Warm and dry NEUROLOGIC:  Alert and oriented x 3 PSYCHIATRIC:  Normal affect   EKG:  EKG is ordered today.  The ekg ordered today demonstrates normal sinus rhythm at 72 bpm, no ST Abnormality  Diagnoses:    1. Coronary artery calcification   2. Essential hypertension   3. Statin myopathy   4. Hyperlipidemia LDL goal <70   5. Coronary artery disease involving native coronary artery of native heart without angina pectoris    Assessment and Plan:     CAD without angina: Elevated CAC and 3 vessel calcification on CT 2018. She denies chest pain, dyspnea, or other symptoms concerning for angina. No indication for further ischemic evaluation at this time. Reviewed indication for further testing, she is currently symptom free and exercises on a consistent basis with no activity intolerance. Continue 150 minutes of moderate intensity exercise each week. Encouraged heart  healthy, low sodium, mostly plant-based diet.  Continue Praluent, Zetia.   Hyperlipidemia LDL goal < 70: LDL 74 on 03/23/22. Intolerant to statins. We discussed incorporating more plant-based eating and avoiding saturated fats.  Continue Praluent, Zetia.  Hypertension: BP is well controlled.      Disposition: 1 year with me  Medication Adjustments/Labs and Tests Ordered: Current medicines are reviewed at length with the patient today.  Concerns regarding medicines are outlined above.  Orders Placed This Encounter  Procedures   EKG 12-Lead   No orders of the defined types were placed in this encounter.   Patient Instructions  Medication Instructions:   Your physician recommends that you continue on your current medications as directed. Please refer to the Current Medication list given to you today.   *If you need a refill on your cardiac medications before your next appointment, please call your pharmacy*   Lab Work:  None ordered.  If you have labs (blood work) drawn today and your tests are completely normal, you will receive your results only by: [place] (if you have MyChart) OR A paper copy in the mail If you have any lab test that is abnormal or we need to change your treatment, we will call you to review the results.   Testing/Procedures:  None ordered.   Follow-Up: At [facility], you and your health needs are our priority.  As part of our continuing mission to provide you with exceptional heart care, we have created designated Provider Care Teams.  These Care Teams include your primary Cardiologist (physician) and Advanced Practice Providers (APPs -  Physician Assistants and Nurse Practitioners) who all work together to provide you with the care you need, when you need it.  We recommend signing up for the patient portal called "MyChart".  Sign up information is provided on this After Visit Summary.  MyChart is used to connect with patients for  Virtual Visits (Telemedicine).  Patients are able to view lab/test results, encounter notes, upcoming appointments, etc.  Non-urgent messages can be sent to your provider as well.   To learn more about what you can do with MyChart, go to [email or web address].  Your next appointment:   1 year(s)  Provider:   Christen Bame, NP         Other Instructions  Your physician wants you to follow-up in: 1 year with Lorenda Peck. You will receive a reminder letter in the mail two months in advance. If you don't receive a letter, please call our office to schedule the follow-up appointment.  Adopting a Healthy Lifestyle.   Weight: Know what a healthy weight is for you (roughly BMI <25) and aim to maintain this. You can calculate your body mass index on your smart phone  Diet: Aim for 7+ servings of fruits and vegetables daily Limit animal fats in diet for cholesterol and heart health - choose grass fed whenever available Avoid highly processed foods (fast food burgers, tacos, fried chicken, pizza, hot dogs, french fries)  Saturated fat comes in the form of butter, lard, coconut oil, margarine, partially hydrogenated oils, and fat in meat. These increase your risk of cardiovascular disease.  Use healthy plant oils, such as olive, canola, soy, corn, sunflower and peanut.  Whole foods such as fruits, vegetables and whole grains have fiber  Men need > 38 grams of fiber per day Women need > 25 grams of fiber per day  Load up on vegetables and fruits - one-half of your plate: Aim for color and variety, and remember that potatoes dont count. Go for whole grains - one-quarter of your plate: Whole wheat, barley, wheat berries, quinoa, oats, brown rice, and foods made with them. If you want pasta, go with whole wheat pasta. Protein power - one-quarter of your plate: Fish, chicken, beans, and nuts are all healthy, versatile protein sources. Limit red meat. You need carbohydrates for energy! The  type of carbohydrate is more important than the amount. Choose carbohydrates such as vegetables, fruits, whole grains, beans, and nuts in the place of white rice, white pasta, potatoes (baked or fried), macaroni and cheese, cakes, cookies, and donuts.  If youre thirsty, drink water. Coffee and tea are good in moderation, but skip sugary drinks and limit milk and dairy products to one or two daily servings. Keep sugar intake at 6 teaspoons or 24 grams or LESS       Exercise: Aim for 150 min of moderate intensity exercise weekly for heart health, and weights twice weekly for bone health Stay active - any steps are better than no steps! Aim for 7-9 hours of sleep daily      Mediterranean Diet A Mediterranean diet refers to food and lifestyle choices that are based on the traditions of countries located on the [employer]. It focuses on eating more fruits, vegetables, whole grains, beans, nuts, seeds, and heart-healthy fats, and eating less dairy, meat, eggs, and processed foods with added sugar, salt, and fat. This way of eating has been shown to help prevent certain conditions and improve outcomes for people who have chronic diseases, like kidney disease and heart disease. What are tips for following this plan? Reading food labels Check the serving size of packaged foods. For foods such as rice and pasta, the serving size refers to the amount of cooked product, not dry. Check the total fat in packaged foods. Avoid foods that have saturated fat or trans fats. Check the ingredient list for added sugars, such as corn syrup. Shopping  Buy a variety of foods that offer a balanced diet, including: Fresh fruits and vegetables (produce). Grains, beans, nuts, and seeds. Some of these may be available in unpackaged forms  or large amounts (in bulk). Fresh seafood. Poultry and eggs. Low-fat dairy products. Buy whole ingredients instead of prepackaged foods. Buy fresh fruits and vegetables  in-season from local farmers markets. Buy plain frozen fruits and vegetables. If you do not have access to quality fresh seafood, buy precooked frozen shrimp or canned fish, such as tuna, salmon, or sardines. Stock your pantry so you always have certain foods on hand, such as olive oil, canned tuna, canned tomatoes, rice, pasta, and beans. Cooking Cook foods with extra-virgin olive oil instead of using butter or other vegetable oils. Have meat as a side dish, and have vegetables or grains as your main dish. This means having meat in small portions or adding small amounts of meat to foods like pasta or stew. Use beans or vegetables instead of meat in common dishes like chili or lasagna. Experiment with different cooking methods. Try roasting, broiling, steaming, and sauting vegetables. Add frozen vegetables to soups, stews, pasta, or rice. Add nuts or seeds for added healthy fats and plant protein at each meal. You can add these to yogurt, salads, or vegetable dishes. Marinate fish or vegetables using olive oil, lemon juice, garlic, and fresh herbs. Meal planning Plan to eat one vegetarian meal one day each week. Try to work up to two vegetarian meals, if possible. Eat seafood two or more times a week. Have healthy snacks readily available, such as: Vegetable sticks with hummus. Greek yogurt. Fruit and nut trail mix. Eat balanced meals throughout the week. This includes: Fruit: 2-3 servings a day. Vegetables: 4-5 servings a day. Low-fat dairy: 2 servings a day. Fish, poultry, or lean meat: 1 serving a day. Beans and legumes: 2 or more servings a week. Nuts and seeds: 1-2 servings a day. Whole grains: 6-8 servings a day. Extra-virgin olive oil: 3-4 servings a day. Limit red meat and sweets to only a few servings a month. Lifestyle  Cook and eat meals together with your family, when possible. Drink enough fluid to keep your urine pale yellow. Be physically active every day. This  includes: Aerobic exercise like running or swimming. Leisure activities like gardening, walking, or housework. Get 7-8 hours of sleep each night. If recommended by your health care provider, drink red wine in moderation. This means 1 glass a day for nonpregnant women and 2 glasses a day for men. A glass of wine equals 5 oz (150 mL). What foods should I eat? Fruits Apples. Apricots. Avocado. Berries. Bananas. Cherries. Dates. Figs. Grapes. Lemons. Melon. Oranges. Peaches. Plums. Pomegranate. Vegetables Artichokes. Beets. Broccoli. Cabbage. Carrots. Eggplant. Green beans. Chard. Kale. Spinach. Onions. Leeks. Peas. Squash. Tomatoes. Peppers. Radishes. Grains Whole-grain pasta. Brown rice. Bulgur wheat. Polenta. Couscous. Whole-wheat bread. [place]. Meats and other proteins Beans. Almonds. Sunflower seeds. Pine nuts. Peanuts. [place]. Salmon. Scallops. Shrimp. [place]. Tilapia. Clams. Oysters. Eggs. Poultry without skin. Dairy Low-fat milk. Cheese. Greek yogurt. Fats and oils Extra-virgin olive oil. Avocado oil. Grapeseed oil. Beverages Water. Red wine. Herbal tea. Sweets and desserts Greek yogurt with honey. Baked apples. Poached pears. Trail mix. Seasonings and condiments Basil. Cilantro. Coriander. Cumin. Mint. Parsley. Sage. Rosemary. Tarragon. Garlic. Oregano. Thyme. Pepper. Balsamic vinegar. Tahini. Hummus. Tomato sauce. Olives. Mushrooms. The items listed above may not be a complete list of foods and beverages you can eat. Contact a dietitian for more information. What foods should I limit? This is a list of foods that should be eaten rarely or only on special occasions. Fruits Fruit canned in syrup. Vegetables Deep-fried potatoes (french  fries). Grains Prepackaged pasta or rice dishes. Prepackaged cereal with added sugar. Prepackaged snacks with added sugar. Meats and other proteins Beef. Pork. Lamb. Poultry with skin. Hot dogs. Berniece Salines. Dairy Ice cream. Sour cream. Whole  milk. Fats and oils Butter. Canola oil. Vegetable oil. Beef fat (tallow). Lard. Beverages Juice. Sugar-sweetened soft drinks. Beer. Liquor and spirits. Sweets and desserts Cookies. Cakes. Pies. Candy. Seasonings and condiments Mayonnaise. Pre-made sauces and marinades. The items listed above may not be a complete list of foods and beverages you should limit. Contact a dietitian for more information. Summary The Mediterranean diet includes both food and lifestyle choices. Eat a variety of fresh fruits and vegetables, beans, nuts, seeds, and whole grains. Limit the amount of red meat and sweets that you eat. If recommended by your health care provider, drink red wine in moderation. This means 1 glass a day for nonpregnant women and 2 glasses a day for men. A glass of wine equals 5 oz (150 mL). This information is not intended to replace advice given to you by your health care provider. Make sure you discuss any questions you have with your health care provider. Document Revised: 08/28/2019 Document Reviewed: 06/25/2019 Elsevier Patient Education  2023 [place], Emmaline Life, NP  09/11/2022 4:50 PM    [place]

## 2022-09-11 ENCOUNTER — Ambulatory Visit: Payer: Medicare Other | Attending: Nurse Practitioner | Admitting: Nurse Practitioner

## 2022-09-11 ENCOUNTER — Encounter: Payer: Self-pay | Admitting: Nurse Practitioner

## 2022-09-11 VITALS — BP 131/75 | HR 72 | Ht 66.0 in | Wt 165.0 lb

## 2022-09-11 DIAGNOSIS — I2510 Atherosclerotic heart disease of native coronary artery without angina pectoris: Secondary | ICD-10-CM | POA: Diagnosis not present

## 2022-09-11 DIAGNOSIS — I2584 Coronary atherosclerosis due to calcified coronary lesion: Secondary | ICD-10-CM | POA: Diagnosis not present

## 2022-09-11 DIAGNOSIS — E785 Hyperlipidemia, unspecified: Secondary | ICD-10-CM | POA: Diagnosis not present

## 2022-09-11 DIAGNOSIS — G720 Drug-induced myopathy: Secondary | ICD-10-CM

## 2022-09-11 DIAGNOSIS — I10 Essential (primary) hypertension: Secondary | ICD-10-CM

## 2022-09-11 DIAGNOSIS — T466X5D Adverse effect of antihyperlipidemic and antiarteriosclerotic drugs, subsequent encounter: Secondary | ICD-10-CM | POA: Diagnosis not present

## 2022-09-11 NOTE — Patient Instructions (Signed)
Medication Instructions:   Your physician recommends that you continue on your current medications as directed. Please refer to the Current Medication list given to you today.   *If you need a refill on your cardiac medications before your next appointment, please call your pharmacy*   Lab Work:  None ordered.  If you have labs (blood work) drawn today and your tests are completely normal, you will receive your results only by: [place] (if you have MyChart) OR A paper copy in the mail If you have any lab test that is abnormal or we need to change your treatment, we will call you to review the results.   Testing/Procedures:  None ordered.   Follow-Up: At [facility], you and your health needs are our priority.  As part of our continuing mission to provide you with exceptional heart care, we have created designated Provider Care Teams.  These Care Teams include your primary Cardiologist (physician) and Advanced Practice Providers (APPs -  Physician Assistants and Nurse Practitioners) who all work together to provide you with the care you need, when you need it.  We recommend signing up for the patient portal called "MyChart".  Sign up information is provided on this After Visit Summary.  MyChart is used to connect with patients for Virtual Visits (Telemedicine).  Patients are able to view lab/test results, encounter notes, upcoming appointments, etc.  Non-urgent messages can be sent to your provider as well.   To learn more about what you can do with MyChart, go to [email or web address].    Your next appointment:   1 year(s)  Provider:   Christen Bame, NP         Other Instructions  Your physician wants you to follow-up in: 1 year with Lorenda Peck. You will receive a reminder letter in the mail two months in advance. If you don't receive a letter, please call our office to schedule the follow-up appointment.  Adopting a Healthy Lifestyle.    Weight: Know what a healthy weight is for you (roughly BMI <25) and aim to maintain this. You can calculate your body mass index on your smart phone  Diet: Aim for 7+ servings of fruits and vegetables daily Limit animal fats in diet for cholesterol and heart health - choose grass fed whenever available Avoid highly processed foods (fast food burgers, tacos, fried chicken, pizza, hot dogs, french fries)  Saturated fat comes in the form of butter, lard, coconut oil, margarine, partially hydrogenated oils, and fat in meat. These increase your risk of cardiovascular disease.  Use healthy plant oils, such as olive, canola, soy, corn, sunflower and peanut.  Whole foods such as fruits, vegetables and whole grains have fiber  Men need > 38 grams of fiber per day Women need > 25 grams of fiber per day  Load up on vegetables and fruits - one-half of your plate: Aim for color and variety, and remember that potatoes dont count. Go for whole grains - one-quarter of your plate: Whole wheat, barley, wheat berries, quinoa, oats, brown rice, and foods made with them. If you want pasta, go with whole wheat pasta. Protein power - one-quarter of your plate: Fish, chicken, beans, and nuts are all healthy, versatile protein sources. Limit red meat. You need carbohydrates for energy! The type of carbohydrate is more important than the amount. Choose carbohydrates such as vegetables, fruits, whole grains, beans, and nuts in the place of white rice, white pasta, potatoes (baked or fried), macaroni and cheese, cakes,  cookies, and donuts.  If youre thirsty, drink water. Coffee and tea are good in moderation, but skip sugary drinks and limit milk and dairy products to one or two daily servings. Keep sugar intake at 6 teaspoons or 24 grams or LESS       Exercise: Aim for 150 min of moderate intensity exercise weekly for heart health, and weights twice weekly for bone health Stay active - any steps are better than no  steps! Aim for 7-9 hours of sleep daily      Mediterranean Diet A Mediterranean diet refers to food and lifestyle choices that are based on the traditions of countries located on the [employer]. It focuses on eating more fruits, vegetables, whole grains, beans, nuts, seeds, and heart-healthy fats, and eating less dairy, meat, eggs, and processed foods with added sugar, salt, and fat. This way of eating has been shown to help prevent certain conditions and improve outcomes for people who have chronic diseases, like kidney disease and heart disease. What are tips for following this plan? Reading food labels Check the serving size of packaged foods. For foods such as rice and pasta, the serving size refers to the amount of cooked product, not dry. Check the total fat in packaged foods. Avoid foods that have saturated fat or trans fats. Check the ingredient list for added sugars, such as corn syrup. Shopping  Buy a variety of foods that offer a balanced diet, including: Fresh fruits and vegetables (produce). Grains, beans, nuts, and seeds. Some of these may be available in unpackaged forms or large amounts (in bulk). Fresh seafood. Poultry and eggs. Low-fat dairy products. Buy whole ingredients instead of prepackaged foods. Buy fresh fruits and vegetables in-season from local farmers markets. Buy plain frozen fruits and vegetables. If you do not have access to quality fresh seafood, buy precooked frozen shrimp or canned fish, such as tuna, salmon, or sardines. Stock your pantry so you always have certain foods on hand, such as olive oil, canned tuna, canned tomatoes, rice, pasta, and beans. Cooking Cook foods with extra-virgin olive oil instead of using butter or other vegetable oils. Have meat as a side dish, and have vegetables or grains as your main dish. This means having meat in small portions or adding small amounts of meat to foods like pasta or stew. Use beans or vegetables  instead of meat in common dishes like chili or lasagna. Experiment with different cooking methods. Try roasting, broiling, steaming, and sauting vegetables. Add frozen vegetables to soups, stews, pasta, or rice. Add nuts or seeds for added healthy fats and plant protein at each meal. You can add these to yogurt, salads, or vegetable dishes. Marinate fish or vegetables using olive oil, lemon juice, garlic, and fresh herbs. Meal planning Plan to eat one vegetarian meal one day each week. Try to work up to two vegetarian meals, if possible. Eat seafood two or more times a week. Have healthy snacks readily available, such as: Vegetable sticks with hummus. Greek yogurt. Fruit and nut trail mix. Eat balanced meals throughout the week. This includes: Fruit: 2-3 servings a day. Vegetables: 4-5 servings a day. Low-fat dairy: 2 servings a day. Fish, poultry, or lean meat: 1 serving a day. Beans and legumes: 2 or more servings a week. Nuts and seeds: 1-2 servings a day. Whole grains: 6-8 servings a day. Extra-virgin olive oil: 3-4 servings a day. Limit red meat and sweets to only a few servings a month. Lifestyle  Cook and eat meals  together with your family, when possible. Drink enough fluid to keep your urine pale yellow. Be physically active every day. This includes: Aerobic exercise like running or swimming. Leisure activities like gardening, walking, or housework. Get 7-8 hours of sleep each night. If recommended by your health care provider, drink red wine in moderation. This means 1 glass a day for nonpregnant women and 2 glasses a day for men. A glass of wine equals 5 oz (150 mL). What foods should I eat? Fruits Apples. Apricots. Avocado. Berries. Bananas. Cherries. Dates. Figs. Grapes. Lemons. Melon. Oranges. Peaches. Plums. Pomegranate. Vegetables Artichokes. Beets. Broccoli. Cabbage. Carrots. Eggplant. Green beans. Chard. Kale. Spinach. Onions. Leeks. Peas. Squash. Tomatoes.  Peppers. Radishes. Grains Whole-grain pasta. Brown rice. Bulgur wheat. Polenta. Couscous. Whole-wheat bread. [place]. Meats and other proteins Beans. Almonds. Sunflower seeds. Pine nuts. Peanuts. [place]. Salmon. Scallops. Shrimp. Niles. Tilapia. Clams. Oysters. Eggs. Poultry without skin. Dairy Low-fat milk. Cheese. Greek yogurt. Fats and oils Extra-virgin olive oil. Avocado oil. Grapeseed oil. Beverages Water. Red wine. Herbal tea. Sweets and desserts Greek yogurt with honey. Baked apples. Poached pears. Trail mix. Seasonings and condiments Basil. Cilantro. Coriander. Cumin. Mint. Parsley. Sage. Rosemary. Tarragon. Garlic. Oregano. Thyme. Pepper. Balsamic vinegar. Tahini. Hummus. Tomato sauce. Olives. Mushrooms. The items listed above may not be a complete list of foods and beverages you can eat. Contact a dietitian for more information. What foods should I limit? This is a list of foods that should be eaten rarely or only on special occasions. Fruits Fruit canned in syrup. Vegetables Deep-fried potatoes (french fries). Grains Prepackaged pasta or rice dishes. Prepackaged cereal with added sugar. Prepackaged snacks with added sugar. Meats and other proteins Beef. Pork. Lamb. Poultry with skin. Hot dogs. Berniece Salines. Dairy Ice cream. Sour cream. Whole milk. Fats and oils Butter. Canola oil. Vegetable oil. Beef fat (tallow). Lard. Beverages Juice. Sugar-sweetened soft drinks. Beer. Liquor and spirits. Sweets and desserts Cookies. Cakes. Pies. Candy. Seasonings and condiments Mayonnaise. Pre-made sauces and marinades. The items listed above may not be a complete list of foods and beverages you should limit. Contact a dietitian for more information. Summary The Mediterranean diet includes both food and lifestyle choices. Eat a variety of fresh fruits and vegetables, beans, nuts, seeds, and whole grains. Limit the amount of red meat and sweets that you eat. If recommended by your  health care provider, drink red wine in moderation. This means 1 glass a day for nonpregnant women and 2 glasses a day for men. A glass of wine equals 5 oz (150 mL). This information is not intended to replace advice given to you by your health care provider. Make sure you discuss any questions you have with your health care provider. Document Revised: 08/28/2019 Document Reviewed: 06/25/2019 Elsevier Patient Education  [place].

## 2022-09-14 ENCOUNTER — Ambulatory Visit: Payer: Medicare Other | Admitting: Nurse Practitioner

## 2022-09-28 ENCOUNTER — Other Ambulatory Visit: Payer: Self-pay | Admitting: Cardiovascular Disease

## 2022-09-28 MED ORDER — TRIAMTERENE-HCTZ 75-50 MG PO TABS: ORAL_TABLET | ORAL | 3 refills | Status: DC

## 2022-09-28 MED ORDER — LOSARTAN POTASSIUM 50 MG PO TABS: 50.0000 mg | ORAL_TABLET | Freq: Every day | ORAL | 3 refills | Status: DC

## 2022-10-08 ENCOUNTER — Ambulatory Visit
Admission: RE | Admit: 2022-10-08 | Discharge: 2022-10-08 | Disposition: A | Discharge status: Home / Self Care | Payer: Medicare Other | Source: Ambulatory Visit | Attending: Family Medicine | Admitting: Family Medicine

## 2022-10-08 DIAGNOSIS — Z1231 Encounter for screening mammogram for malignant neoplasm of breast: Secondary | ICD-10-CM

## 2022-10-26 ENCOUNTER — Other Ambulatory Visit: Payer: Self-pay | Admitting: Cardiovascular Disease

## 2022-11-07 DIAGNOSIS — L821 Other seborrheic keratosis: Secondary | ICD-10-CM | POA: Diagnosis not present

## 2022-11-07 DIAGNOSIS — I788 Other diseases of capillaries: Secondary | ICD-10-CM | POA: Diagnosis not present

## 2022-11-07 DIAGNOSIS — L820 Inflamed seborrheic keratosis: Secondary | ICD-10-CM | POA: Diagnosis not present

## 2022-11-15 ENCOUNTER — Other Ambulatory Visit: Payer: Self-pay | Admitting: Cardiovascular Disease

## 2023-02-26 ENCOUNTER — Ambulatory Visit (INDEPENDENT_AMBULATORY_CARE_PROVIDER_SITE_OTHER): Payer: Medicare Other | Admitting: Family Medicine

## 2023-02-26 ENCOUNTER — Encounter: Payer: Self-pay | Admitting: Family Medicine

## 2023-02-26 VITALS — BP 138/71 | HR 74 | Temp 97.8°F | Ht 66.0 in | Wt 165.4 lb

## 2023-02-26 DIAGNOSIS — Z0001 Encounter for general adult medical examination with abnormal findings: Secondary | ICD-10-CM

## 2023-02-26 DIAGNOSIS — J4520 Mild intermittent asthma, uncomplicated: Secondary | ICD-10-CM

## 2023-02-26 DIAGNOSIS — R739 Hyperglycemia, unspecified: Secondary | ICD-10-CM

## 2023-02-26 DIAGNOSIS — I10 Essential (primary) hypertension: Secondary | ICD-10-CM | POA: Diagnosis not present

## 2023-02-26 DIAGNOSIS — E785 Hyperlipidemia, unspecified: Secondary | ICD-10-CM

## 2023-02-26 DIAGNOSIS — E2839 Other primary ovarian failure: Secondary | ICD-10-CM

## 2023-02-26 MED ORDER — FLUTICASONE PROPIONATE HFA 110 MCG/ACT IN AERO: 2.0000 | INHALATION_SPRAY | Freq: Every day | RESPIRATORY_TRACT | 2 refills | Status: DC

## 2023-02-26 NOTE — Progress Notes (Signed)
Chief Complaint:  Ashley Dyer is a 77 y.o. female who presents today for her annual comprehensive physical exam.    Assessment/Plan:  Chronic Problems Addressed Today: Essential hypertension Blood pressure at goal on Maxide 75-50 and 37.5-25 on alternating days.  Check labs.  Dyslipidemia Follows with cardiology.  On Praluent and Zetia.  Will check lipids with blood work.  Mild intermittent asthma without complication Stable.  Continue Flovent as needed seasonally.  Will refill Flovent today.  Also uses albuterol as needed.  Does not need refill on this today.  Preventative Healthcare: She will come back in a month for labs..  Due for colonoscopy next year.  Order DEXA scan.  Up-to-date on vaccines.  Patient Counseling(The following topics were reviewed and/or handout was given):  -Nutrition: Stressed importance of moderation in sodium/caffeine intake, saturated fat and cholesterol, caloric balance, sufficient intake of fresh fruits, vegetables, and fiber.  -Stressed the importance of regular exercise.   -Substance Abuse: Discussed cessation/primary prevention of tobacco, alcohol, or other drug use; driving or other dangerous activities under the influence; availability of treatment for abuse.   -Injury prevention: Discussed safety belts, safety helmets, smoke detector, smoking near bedding or upholstery.   -Sexuality: Discussed sexually transmitted diseases, partner selection, use of condoms, avoidance of unintended pregnancy and contraceptive alternatives.   -Dental health: Discussed importance of regular tooth brushing, flossing, and dental visits.  -Health maintenance and immunizations reviewed. Please refer to Health maintenance section.  Return to care in 1 year for next preventative visit.     Subjective:  HPI:  She has no acute complaints today. See Assessment / plan for status of chronic conditions.   Lifestyle Diet: Balanced. Plenty of fruits and vegetables.   Exercise: Walking several miles most day of the week.      03/23/2022    3:16 PM  Depression screen PHQ 2/9  Decreased Interest 0  Down, Depressed, Hopeless 0  PHQ - 2 Score 0    Health Maintenance Due  Topic Date Due   Medicare Annual Wellness (AWV)  Never done   DEXA SCAN  03/11/2020   COVID-19 Vaccine (4 - 2023-24 season) 04/06/2022     ROS: Per HPI, otherwise a complete review of systems was negative.   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   Nephrolithiasis 10/22/2019   Recurrent nephrolithiasis 09/08/2019   S/P hip replacement, left 05/11/2019   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   Status post total shoulder arthroplasty, 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   Essential hypertension 09/08/2018   Dyslipidemia 09/08/2018   Allergic rhinitis 09/08/2018   Mild intermittent asthma without complication 09/08/2018   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   Past Surgical History:  Procedure Laterality Date   CATARACT EXTRACTION Bilateral    EYE SURGERY     KIDNEY STONE SURGERY     NEPHROLITHOTOMY Right 10/22/2019  Procedure: RIGHT NEPHROLITHOTOMY PERCUTANEOUS WITH SURGEON ACCESS;  Surgeon: Crist Fat, MD;  Location: [facility];  Service: Urology;  Laterality: Right;   TONSILLECTOMY     TOTAL HIP ARTHROPLASTY Left 05/11/2019   Procedure: LEFT TOTAL HIP ARTHROPLASTY ANTERIOR APPROACH;  Surgeon: Gean Birchwood, MD;  Location: [facility];  Service:  Orthopedics;  Laterality: Left;   TOTAL SHOULDER ARTHROPLASTY Right 06/13/2017   TOTAL SHOULDER ARTHROPLASTY Right 06/13/2017   Procedure: TOTAL SHOULDER ARTHROPLASTY;  Surgeon: Jones Broom, MD;  Location: [facility];  Service: Orthopedics;  Laterality: Right;  Right total shoulder arthroplasty   TOTAL SHOULDER ARTHROPLASTY Left 01/22/2019   Procedure: TOTAL SHOULDER ARTHROPLASTY;  Surgeon: Jones Broom, MD;  Location: [facility];  Service: Orthopedics;  Laterality: Left;   VAGINAL DELIVERY  1976    Family History  Problem Relation Age of Onset   Atrial fibrillation Mother    Heart disease Father    Colon cancer Paternal Grandfather    Leukemia Brother    Breast cancer Neg Hx     Medications- reviewed and updated Current Outpatient Medications  Medication Sig Dispense Refill   Alirocumab (PRALUENT) 150 MG/ML SOAJ INJECT 1 PEN INTO THE SKIN EVERY 14 DAYS 2 mL 11   ASHWAGANDHA PO Take 1,000 mg by mouth in the morning and at bedtime.     Cholecalciferol (VITAMIN D) 50 MCG (2000 UT) tablet Take 4,000 Units by mouth daily.     Coenzyme Q10 (COQ-10 PO) Take 1 capsule by mouth daily.     estradiol (ESTRACE) 0.1 MG/GM vaginal cream Place 1 g vaginally 2 (two) times a week.      ezetimibe (ZETIA) 10 MG tablet Take 1 tablet (10 mg total) by mouth daily. 90 tablet 3   Ibuprofen-Acetaminophen 125-250 MG TABS Take 2 tablets by mouth daily as needed (pain).     losartan (COZAAR) 50 MG tablet Take 1 tablet (50 mg total) by mouth daily. 90 tablet 3   triamterene-hydrochlorothiazide (MAXZIDE) 75-50 MG tablet TAKE 1 TABLET BY MOUTH EVERY OTHER DAY ALTERNATING WITH 1/2 TABLET THE OTHER DAYS 75 tablet 3   fluticasone (FLOVENT HFA) 110 MCG/ACT inhaler Inhale 2 puffs into the lungs daily. 12 g 2   No current facility-administered medications for this visit.    Allergies-reviewed and updated No Known Allergies  Social History   Socioeconomic History   Marital status: Single    Spouse name: Not on  file   Number of children: Not on file   Years of education: Not on file   Highest education level: Not on file  Occupational History   Not on file  Tobacco Use   Smoking status: Former    Current packs/day: 0.00    Types: Cigarettes    Quit date: 5    Years since quitting: 34.5   Smokeless tobacco: Never  Vaping Use   Vaping status: Never Used  Substance and Sexual Activity   Alcohol use: Yes    Alcohol/week: 8.0 - 10.0 standard drinks of alcohol    Types: 8 - 10 Glasses of wine per week    Comment: week    Drug use: No   Sexual activity: Not Currently  Other Topics Concern   Not on file  Social History Narrative   Not on file   Social Determinants of Health   Financial Resource Strain: Not on file  Food Insecurity: Not on file  Transportation Needs: Not on file  Physical Activity: Not on file  Stress: Not on file  Social Connections: Not on file        Objective:  Physical Exam: BP 138/71   Pulse 74   Temp 97.8 F (36.6 C) (Temporal)   Ht 5\' 6"  (1.676 m)   Wt 165 lb 6.4 oz (75 kg)   SpO2 96%   BMI 26.70 kg/m   Body mass index is 26.7 kg/m. Wt Readings from Last 3 Encounters:  02/26/23 165 lb 6.4 oz (75 kg)  09/11/22 165 lb (74.8 kg)  03/23/22 167 lb 6.4 oz (75.9 kg)   Gen: NAD, resting comfortably HEENT: TMs normal bilaterally. OP clear. No thyromegaly noted.  CV: RRR with no murmurs appreciated Pulm: NWOB, CTAB with no crackles, wheezes, or rhonchi GI: Normal bowel sounds present. Soft, Nontender, Nondistended. MSK: no edema, cyanosis, or clubbing noted Skin: warm, dry Neuro: CN2-12 grossly intact. Strength 5/5 in upper and lower extremities. Reflexes symmetric and intact bilaterally.  Psych: Normal affect and thought content     Stephana Morell M. Jimmey Ralph, MD 02/26/2023 12:00 PM

## 2023-02-26 NOTE — Assessment & Plan Note (Signed)
Follows with cardiology.  On Praluent and Zetia.  Will check lipids with blood work.

## 2023-02-26 NOTE — Assessment & Plan Note (Signed)
Blood pressure at goal on Maxide 75-50 and 37.5-25 on alternating days.  Check labs.

## 2023-02-26 NOTE — Assessment & Plan Note (Signed)
Stable.  Continue Flovent as needed seasonally.  Will refill Flovent today.  Also uses albuterol as needed.  Does not need refill on this today.

## 2023-03-28 DIAGNOSIS — M7062 Trochanteric bursitis, left hip: Secondary | ICD-10-CM | POA: Diagnosis not present

## 2023-04-05 ENCOUNTER — Encounter: Payer: Self-pay | Admitting: Physician Assistant

## 2023-04-05 ENCOUNTER — Ambulatory Visit (INDEPENDENT_AMBULATORY_CARE_PROVIDER_SITE_OTHER): Payer: Medicare Other | Admitting: Physician Assistant

## 2023-04-05 VITALS — BP 124/70 | HR 71 | Temp 97.7°F | Resp 16 | Ht 66.0 in | Wt 158.4 lb

## 2023-04-05 DIAGNOSIS — R197 Diarrhea, unspecified: Secondary | ICD-10-CM | POA: Diagnosis not present

## 2023-04-05 LAB — CBC WITH DIFFERENTIAL/PLATELET
Basophils Absolute: 0.1 10*3/uL (ref 0.0–0.1)
Basophils Relative: 0.7 % (ref 0.0–3.0)
Eosinophils Absolute: 1 10*3/uL — ABNORMAL HIGH (ref 0.0–0.7)
Eosinophils Relative: 11.5 % — ABNORMAL HIGH (ref 0.0–5.0)
HCT: 41.9 % (ref 36.0–46.0)
Hemoglobin: 13.7 g/dL (ref 12.0–15.0)
Lymphocytes Relative: 18.3 % (ref 12.0–46.0)
Lymphs Abs: 1.6 10*3/uL (ref 0.7–4.0)
MCHC: 32.6 g/dL (ref 30.0–36.0)
MCV: 93.9 fl (ref 78.0–100.0)
Monocytes Absolute: 0.6 10*3/uL (ref 0.1–1.0)
Monocytes Relative: 7.2 % (ref 3.0–12.0)
Neutro Abs: 5.4 10*3/uL (ref 1.4–7.7)
Neutrophils Relative %: 62.3 % (ref 43.0–77.0)
Platelets: 281 10*3/uL (ref 150.0–400.0)
RBC: 4.46 Mil/uL (ref 3.87–5.11)
RDW: 13.8 % (ref 11.5–15.5)
WBC: 8.7 10*3/uL (ref 4.0–10.5)

## 2023-04-05 LAB — COMPREHENSIVE METABOLIC PANEL
ALT: 17 U/L (ref 0–35)
AST: 26 U/L (ref 0–37)
Albumin: 4.3 g/dL (ref 3.5–5.2)
Alkaline Phosphatase: 67 U/L (ref 39–117)
BUN: 24 mg/dL — ABNORMAL HIGH (ref 6–23)
CO2: 28 mEq/L (ref 19–32)
Calcium: 9.8 mg/dL (ref 8.4–10.5)
Chloride: 100 mEq/L (ref 96–112)
Creatinine, Ser: 0.85 mg/dL (ref 0.40–1.20)
GFR: 66.03 mL/min (ref >60.00)
Glucose, Bld: 119 mg/dL — ABNORMAL HIGH (ref 70–99)
Potassium: 3.4 mEq/L — ABNORMAL LOW (ref 3.5–5.1)
Sodium: 138 mEq/L (ref 135–145)
Total Bilirubin: 0.4 mg/dL (ref 0.2–1.2)
Total Protein: 7.4 g/dL (ref 6.0–8.3)

## 2023-04-05 LAB — LIPASE: Lipase: 27 U/L (ref 11.0–59.0)

## 2023-04-05 LAB — TSH: TSH: 1.84 u[IU]/mL (ref 0.35–5.50)

## 2023-04-05 MED ORDER — ONDANSETRON HCL 4 MG PO TABS: 4.0000 mg | ORAL_TABLET | Freq: Three times a day (TID) | ORAL | 0 refills | Status: DC | PRN

## 2023-04-05 NOTE — Progress Notes (Signed)
Ashley Dyer is a 77 y.o. female here for a follow up of a pre-existing problem.  History of Present Illness:   Chief Complaint  Patient presents with   Diarrhea    Constant diarrhea since August 23rd, not sure if she ate anything to cause it, feeling tired and having nausea and bloating    HPI  Diarrhea  Patient is complaining of continuous diarrhea since 8/23. Stating it happens every time she eats, about 30 minutes later. This can be occupied with  nausea, bloating, and fatigue. Patient reports that she loves fruit and is not sure if maybe she ate a bad fruit. She does not believe she is dehydrated. Denies recent travel, new medications, being around anyone with the same sxs, fever, chills, dizziness, and heartburn. She is not sure if there is blood in her stool because she has been taking pepto bismol. She is managing sxs with pepto bismol, which she recently stopped and imodium.  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   Walking pneumonia      Social History   Tobacco Use   Smoking status: Former    Current packs/day: 0.00    Types: Cigarettes    Quit date: 1990    Years since quitting: 34.6   Smokeless tobacco: Never  Vaping Use   Vaping status: Never Used  Substance Use Topics   Alcohol use: Yes    Alcohol/week: 8.0 - 10.0 standard drinks of alcohol    Types: 8 - 10 Glasses of wine per week    Comment: week    Drug use: No    Past Surgical History:  Procedure Laterality Date   CATARACT EXTRACTION Bilateral    EYE SURGERY     KIDNEY STONE SURGERY     NEPHROLITHOTOMY Right 10/22/2019   Procedure: RIGHT  NEPHROLITHOTOMY PERCUTANEOUS WITH SURGEON ACCESS;  Surgeon: Crist Fat, MD;  Location: [facility];  Service: Urology;  Laterality: Right;   TONSILLECTOMY     TOTAL HIP ARTHROPLASTY Left 05/11/2019   Procedure: LEFT TOTAL HIP ARTHROPLASTY ANTERIOR APPROACH;  Surgeon: Gean Birchwood, MD;  Location: [facility];  Service: Orthopedics;  Laterality: Left;   TOTAL SHOULDER ARTHROPLASTY Right 06/13/2017   TOTAL SHOULDER ARTHROPLASTY Right 06/13/2017   Procedure: TOTAL SHOULDER ARTHROPLASTY;  Surgeon: Jones Broom, MD;  Location: [facility];  Service: Orthopedics;  Laterality: Right;  Right total shoulder arthroplasty   TOTAL SHOULDER ARTHROPLASTY Left 01/22/2019   Procedure: TOTAL SHOULDER ARTHROPLASTY;  Surgeon: Jones Broom, MD;  Location: [facility];  Service: Orthopedics;  Laterality: Left;   VAGINAL DELIVERY  1976    Family History  Problem Relation Age of Onset   Atrial fibrillation Mother    Heart disease Father    Colon cancer Paternal Grandfather    Leukemia Brother    Breast cancer Neg Hx     No Known Allergies  Current Medications:   Current Outpatient Medications:    Alirocumab (PRALUENT) 150 MG/ML SOAJ, INJECT 1 PEN INTO THE SKIN EVERY 14 DAYS, Disp: 2 mL,  Rfl: 11   ASHWAGANDHA PO, Take 1,000 mg by mouth in the morning and at bedtime., Disp: , Rfl:    Cholecalciferol (VITAMIN D) 50 MCG (2000 UT) tablet, Take 4,000 Units by mouth daily., Disp: , Rfl:    Coenzyme Q10 (COQ-10 PO), Take 1 capsule by mouth daily., Disp: , Rfl:    estradiol (ESTRACE) 0.1 MG/GM vaginal cream, Place 1 g vaginally 2 (two) times a week. , Disp: , Rfl:    ezetimibe (ZETIA) 10 MG tablet, Take 1 tablet (10 mg total) by mouth daily., Disp: 90 tablet, Rfl: 3   Ibuprofen-Acetaminophen 125-250 MG TABS, Take 2 tablets by mouth daily as needed (pain)., Disp: , Rfl:    losartan (COZAAR) 50 MG tablet, Take 1 tablet (50 mg total) by mouth daily., Disp: 90 tablet, Rfl: 3   tretinoin (RETIN-A) 0.025 % cream, Apply  topically at bedtime., Disp: , Rfl:    triamterene-hydrochlorothiazide (MAXZIDE) 75-50 MG tablet, TAKE 1 TABLET BY MOUTH EVERY OTHER DAY ALTERNATING WITH 1/2 TABLET THE OTHER DAYS, Disp: 75 tablet, Rfl: 3   Review of Systems:   Review of Systems  Gastrointestinal:  Positive for diarrhea.    Vitals:   Vitals:   04/05/23 1039  BP: 124/70  Pulse: 71  Resp: 16  Temp: 97.7 F (36.5 C)  TempSrc: Temporal  SpO2: 97%  Weight: 158 lb 6 oz (71.8 kg)  Height: 5\' 6"  (1.676 m)     Body mass index is 25.56 kg/m.  Physical Exam:   Physical Exam Constitutional:      General: She is not in acute distress.    Appearance: Normal appearance. She is not ill-appearing.  HENT:     Head: Normocephalic and atraumatic.     Right Ear: External ear normal.     Left Ear: External ear normal.  Eyes:     Extraocular Movements: Extraocular movements intact.     Pupils: Pupils are equal, round, and reactive to light.  Cardiovascular:     Rate and Rhythm: Normal rate and regular rhythm.     Heart sounds: Normal heart sounds. No murmur heard.    No gallop.  Pulmonary:     Effort: Pulmonary effort is normal. No respiratory distress.     Breath sounds: Normal breath sounds. No wheezing or rales.  Abdominal:     General: Abdomen is flat. Bowel sounds are normal.     Palpations: Abdomen is soft.     Tenderness: There is generalized abdominal tenderness.  Skin:    General: Skin is warm and dry.  Neurological:     Mental Status: She is alert and oriented to person, place, and time.  Psychiatric:        Judgment: Judgment normal.     Assessment and Plan:   Diarrhea, unspecified type No red flags indicating need for IV fluids at this time Recommend continued excellent hydration, bland foods As needed Zofran sent in for nausea Blood work and stool studies completed If symptom(s) unexplained by results and persist, will need gastroenterology referral If worsening symptom(s) over the weekend,  needs to go to ER   I,Verona Buck,acting as a scribe for [employer], PA.,have documented all relevant documentation on the behalf of Jarold Motto, PA,as directed by  Jarold Motto, PA while in the presence of Jarold Motto, [place].  I, Jarold Motto, [place], have reviewed all documentation for this visit. The documentation on 04/05/23 for the exam, diagnosis, procedures, and orders are all accurate and complete.  Jarold Motto,  PA-C

## 2023-04-05 NOTE — Patient Instructions (Signed)
It was great to see you!  Start Zofran as needed for nausea  Continue to push fluids  Most cases of acute diarrhea are due to infections with virus and bacteria and are self-limited conditions lasting less than 14 days.  For your symptoms you may take Imodium 2 mg tablets that are over the counter at your local pharmacy. Take two tablet now and then one after each loose stool up to 6 a day.  Antibiotics are not needed for most people with diarrhea. Please return the stool tests as ordered.  HOME CARE We recommend changing your diet to help with your symptoms for the next few days. Drink plenty of fluids that contain water salt and sugar. Sports drinks such as Gatorade may help.  You may try broths, soups, bananas, applesauce, soft breads, mashed potatoes or crackers.  You are considered infectious for as long as the diarrhea continues. Hand washing or use of alcohol based hand sanitizers is recommend. It is best to stay out of work or school until your symptoms stop.   GET HELP RIGHT AWAY If you have dark yellow colored urine or do not pass urine frequently you should drink more fluids.   If your symptoms worsen  If you feel like you are going to pass out (faint) You have a new problem   Take care,  Jarold Motto PA-C

## 2023-04-08 ENCOUNTER — Other Ambulatory Visit: Payer: Self-pay | Admitting: Physician Assistant

## 2023-04-08 ENCOUNTER — Encounter: Payer: Self-pay | Admitting: Physician Assistant

## 2023-04-08 LAB — GI PROFILE, STOOL, PCR

## 2023-04-08 MED ORDER — CIPROFLOXACIN HCL 500 MG PO TABS: 500.0000 mg | ORAL_TABLET | Freq: Two times a day (BID) | ORAL | 0 refills | Status: AC

## 2023-04-11 ENCOUNTER — Encounter: Payer: Self-pay | Admitting: Physician Assistant

## 2023-05-16 ENCOUNTER — Ambulatory Visit: Payer: Medicare Other | Admitting: Family Medicine

## 2023-05-16 ENCOUNTER — Ambulatory Visit
Admission: RE | Admit: 2023-05-16 | Discharge: 2023-05-16 | Disposition: A | Discharge status: Home / Self Care | Payer: Medicare Other | Source: Ambulatory Visit | Attending: Family Medicine | Admitting: Family Medicine

## 2023-05-16 DIAGNOSIS — Z0001 Encounter for general adult medical examination with abnormal findings: Secondary | ICD-10-CM

## 2023-05-16 DIAGNOSIS — E2839 Other primary ovarian failure: Secondary | ICD-10-CM

## 2023-05-20 ENCOUNTER — Encounter: Payer: Self-pay | Admitting: Family Medicine

## 2023-05-20 DIAGNOSIS — M8580 Other specified disorders of bone density and structure, unspecified site: Secondary | ICD-10-CM | POA: Insufficient documentation

## 2023-05-20 NOTE — Progress Notes (Signed)
Her bone density scan shows that she has mild thinning of the bones called osteopenia.  She does have a 10-year risk for hip fracture at 3.8%.  This does meet criteria for medication management for her osteopenia.  She can schedule appointment to discuss if she wishes.  She should continue with calcium and vitamin D supplementation we can recheck again in a couple of years.

## 2023-06-03 ENCOUNTER — Encounter: Payer: Self-pay | Admitting: Family Medicine

## 2023-06-03 ENCOUNTER — Ambulatory Visit (INDEPENDENT_AMBULATORY_CARE_PROVIDER_SITE_OTHER): Payer: Medicare Other | Admitting: Family Medicine

## 2023-06-03 VITALS — BP 122/54 | HR 73 | Temp 98.0°F | Ht 66.0 in | Wt 164.2 lb

## 2023-06-03 DIAGNOSIS — R059 Cough, unspecified: Secondary | ICD-10-CM

## 2023-06-03 DIAGNOSIS — I10 Essential (primary) hypertension: Secondary | ICD-10-CM

## 2023-06-03 DIAGNOSIS — M8580 Other specified disorders of bone density and structure, unspecified site: Secondary | ICD-10-CM | POA: Diagnosis not present

## 2023-06-03 LAB — POC COVID19 BINAXNOW: SARS Coronavirus 2 Ag: POSITIVE — AB

## 2023-06-03 MED ORDER — AZITHROMYCIN 250 MG PO TABS: ORAL_TABLET | ORAL | 0 refills | Status: DC

## 2023-06-03 NOTE — Assessment & Plan Note (Signed)
Blood pressure at goal on Maxide 75-50 and 37.5-25 on alternating days.

## 2023-06-03 NOTE — Assessment & Plan Note (Signed)
Recent bone density scan showed T-score -1.9 however 10-year hip fracture risk of 3.8%.  We did discuss starting pharmacologic therapy however should like to hold off on this for now.  She will continue with vitamin D supplementation and weightbearing exercises.  Cannot take calcium supplementation due to history of kidney stones.  Repeat bone density scan in 2 years.

## 2023-06-03 NOTE — Patient Instructions (Signed)
It was very nice to see you today!  I think you are recovering from COVID.  He may have a sinus infection.  Start antibiotic if not improving in the next few days.  Please continue vitamin D supplementation.  We can recheck your bone density scan in 2 years.  Return if symptoms worsen or fail to improve.   Take care, Dr Jimmey Ralph  PLEASE NOTE:  If you had any lab tests, please let us know if you have not heard back within a few days. You may see your results on mychart before we have a chance to review them but we will give you a call once they are reviewed by [place].   If we ordered any referrals today, please let us know if you have not heard from their office within the next week.   If you had any urgent prescriptions sent in today, please check with the pharmacy within an hour of our visit to make sure the prescription was transmitted appropriately.   Please try these tips to maintain a healthy lifestyle:  Eat at least 3 REAL meals and 1-2 snacks per day.  Aim for no more than 5 hours between eating.  If you eat breakfast, please do so within one hour of getting up.   Each meal should contain half fruits/vegetables, one quarter protein, and one quarter carbs (no bigger than a computer mouse)  Cut down on sweet beverages. This includes juice, soda, and sweet tea.   Drink at least 1 glass of water with each meal and aim for at least 8 glasses per day  Exercise at least 150 minutes every week.

## 2023-06-03 NOTE — Progress Notes (Signed)
   Ashley Dyer is a 77 y.o. female who presents today for an office visit.  Assessment/Plan:  New/Acute Problems: COVID / Sinusitis  No red flags. Her COVID was faintly positive today and her other symptoms have mostly resolved with the exception of nasal congestion.  Her positive COVID test was likely due to recovering from her infection from a couple of weeks ago.  She does not have any signs of systemic infection and has of reassuring lung exam today.  Anticipate that she will continue to improve over the next several days however we will send in a pocket prescription for azithromycin with instruction not start unless symptoms fail to improve over the next few days.  We discussed reasons to return to care.  Chronic Problems Addressed Today: Osteopenia Recent bone density scan showed T-score -1.9 however 10-year hip fracture risk of 3.8%.  We did discuss starting pharmacologic therapy however should like to hold off on this for now.  She will continue with vitamin D supplementation and weightbearing exercises.  Cannot take calcium supplementation due to history of kidney stones.  Repeat bone density scan in 2 years.  Essential hypertension Blood pressure at goal on Maxide 75-50 and 37.5-25 on alternating days.     Subjective:  HPI:  See A/P for status of chronic conditions.  Patient is here today for follow-up.  Had bone density scan performed earlier this month with T-score -1.9 however 10-year hip fracture of 3.8%.  She has been taking a vitamin D supplementation but cannot take calcium supplementation due to history of kidney stones.  Husband had COVID 2 weeks ago. She did develop some congestion and cough.  This has mostly resolved.  Also had some malaise which is resolved.  Still has a lot of nasal congestion.       Objective:  Physical Exam: BP (!) 122/54   Pulse 73   Temp 98 F (36.7 C) (Temporal)   Ht 5\' 6"  (1.676 m)   Wt 164 lb 3.2 oz (74.5 kg)   SpO2 97%   BMI  26.50 kg/m   Gen: No acute distress, resting comfortably CV: Regular rate and rhythm with no murmurs appreciated Pulm: Normal work of breathing, clear to auscultation bilaterally with no crackles, wheezes, or rhonchi Neuro: Grossly normal, moves all extremities Psych: Normal affect and thought content      Laureano Hetzer M. Jimmey Ralph, MD 06/03/2023 2:05 PM

## 2023-06-13 IMAGING — MG MM DIGITAL SCREENING BILAT W/ TOMO AND CAD
8 series · 8 of 24 positions shown · non-contrast
Comparison: Previous exam(s).

CLINICAL DATA: Screening.

EXAM:
DIGITAL SCREENING BILATERAL MAMMOGRAM WITH TOMOSYNTHESIS AND CAD
TECHNIQUE: Bilateral screening digital craniocaudal and mediolateral oblique
mammograms were obtained. Bilateral screening digital breast
tomosynthesis was performed. The images were evaluated with
computer-aided detection.

[R MLO synth-2D]
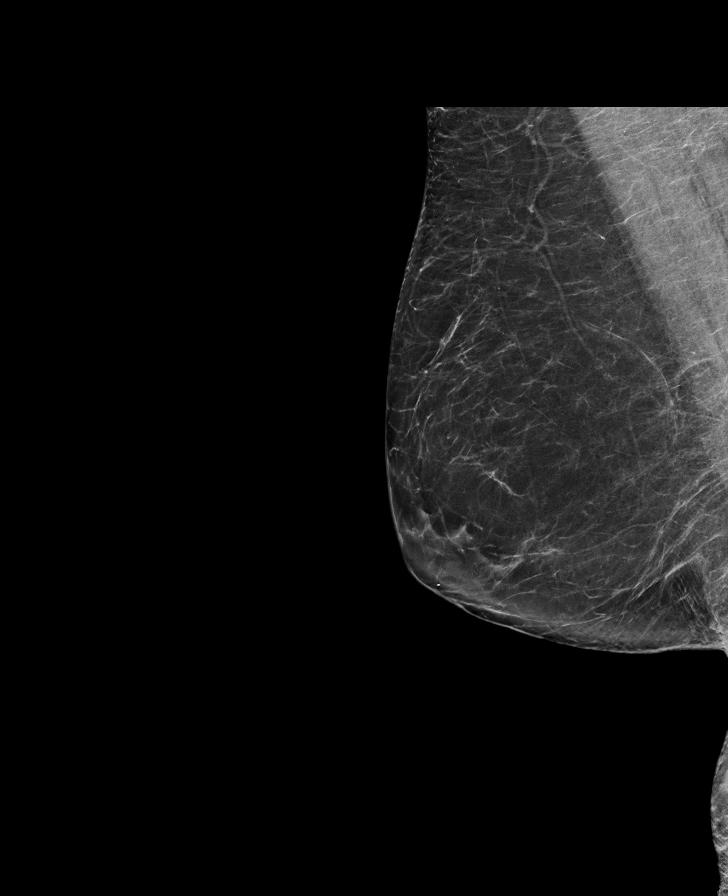

[L MLO synth-2D]
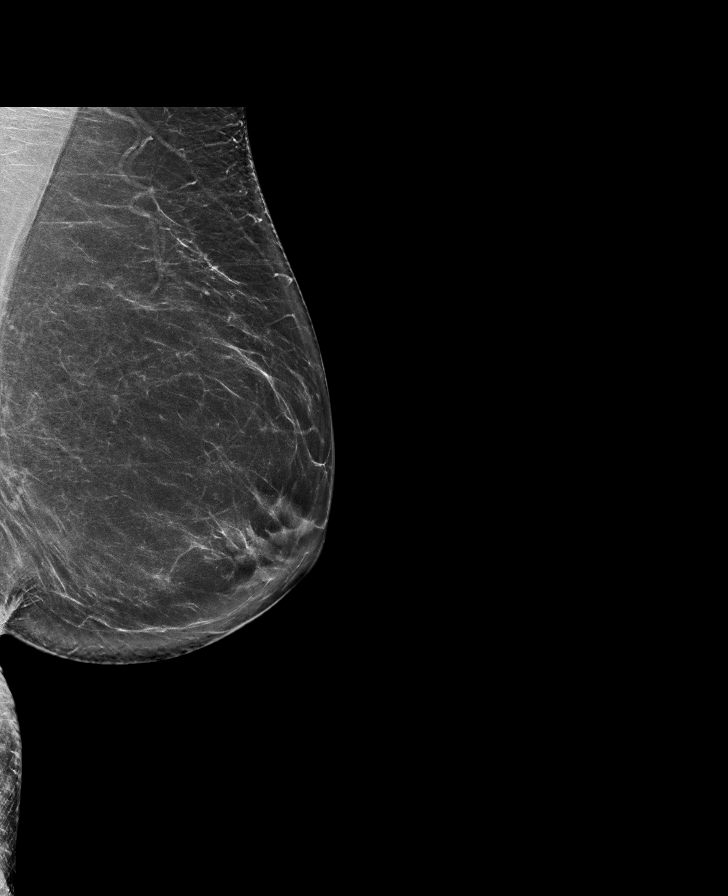

[L CC synth-2D]
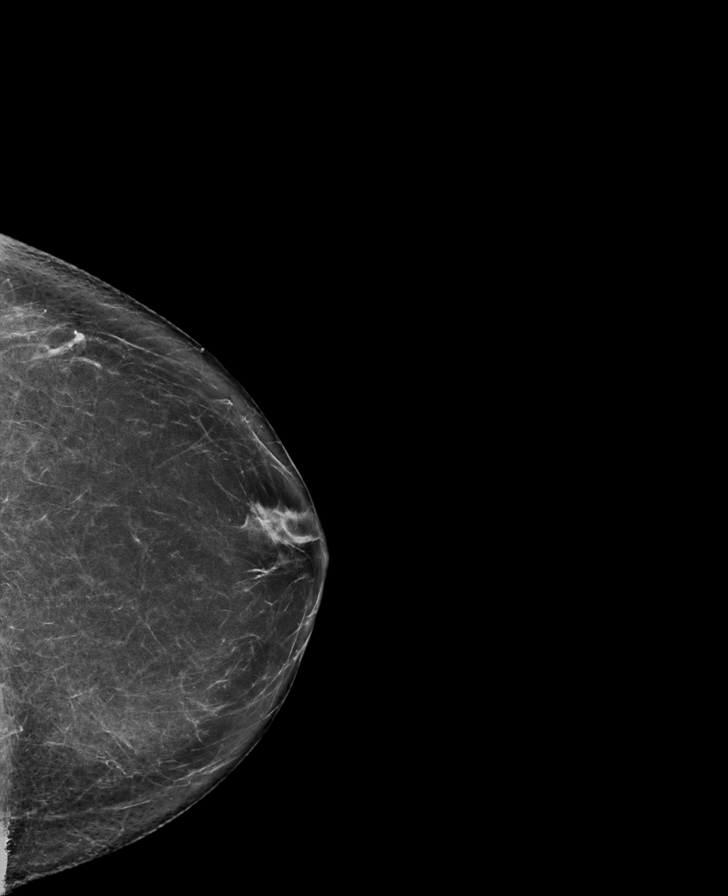

[R CC synth-2D]
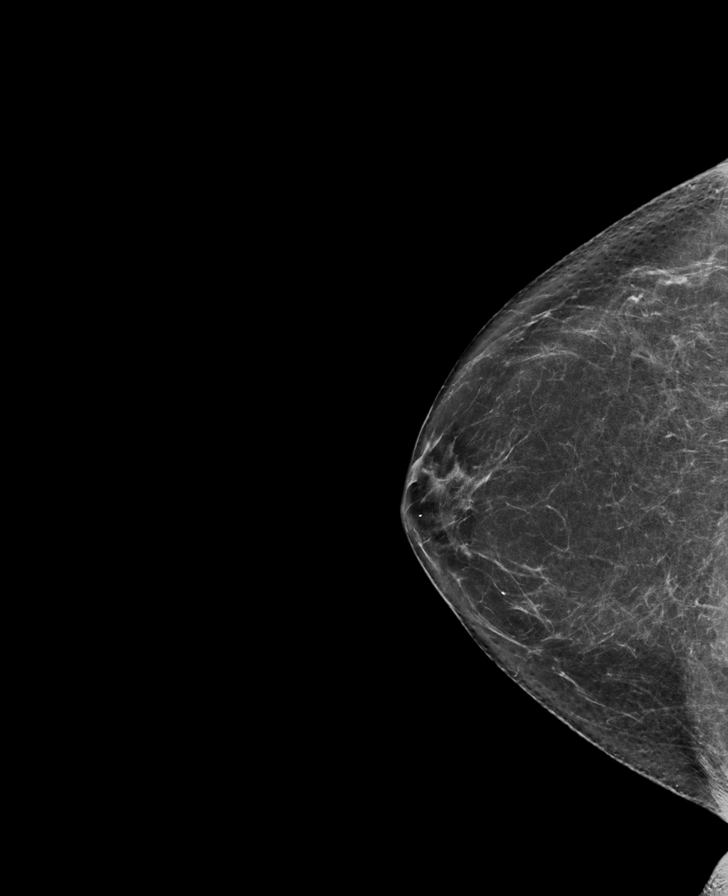

[L CC tomo · tomo slice 39/76.0]
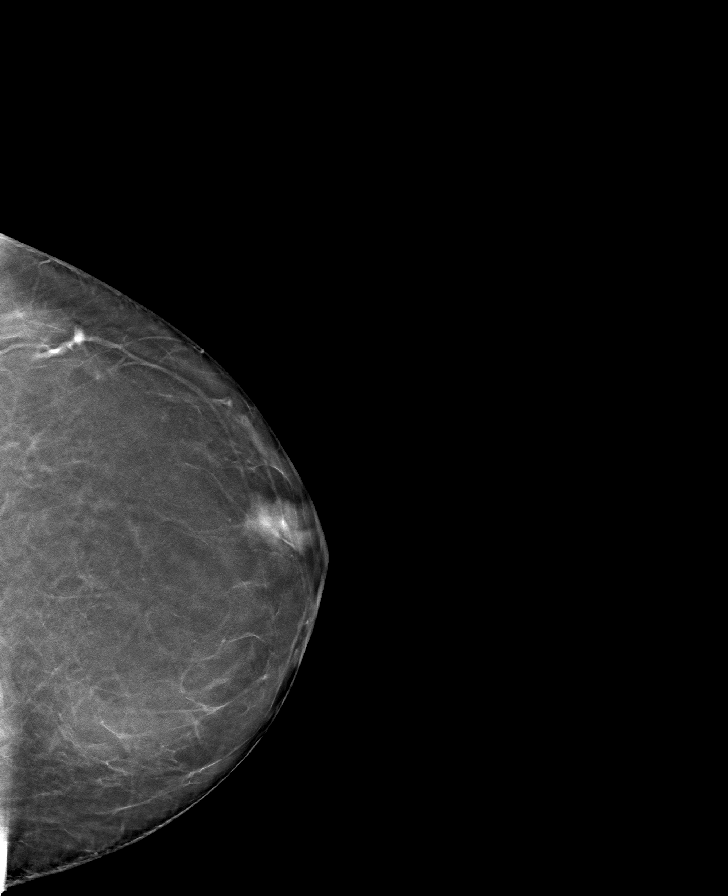

[R CC tomo · tomo slice 36/71.0]
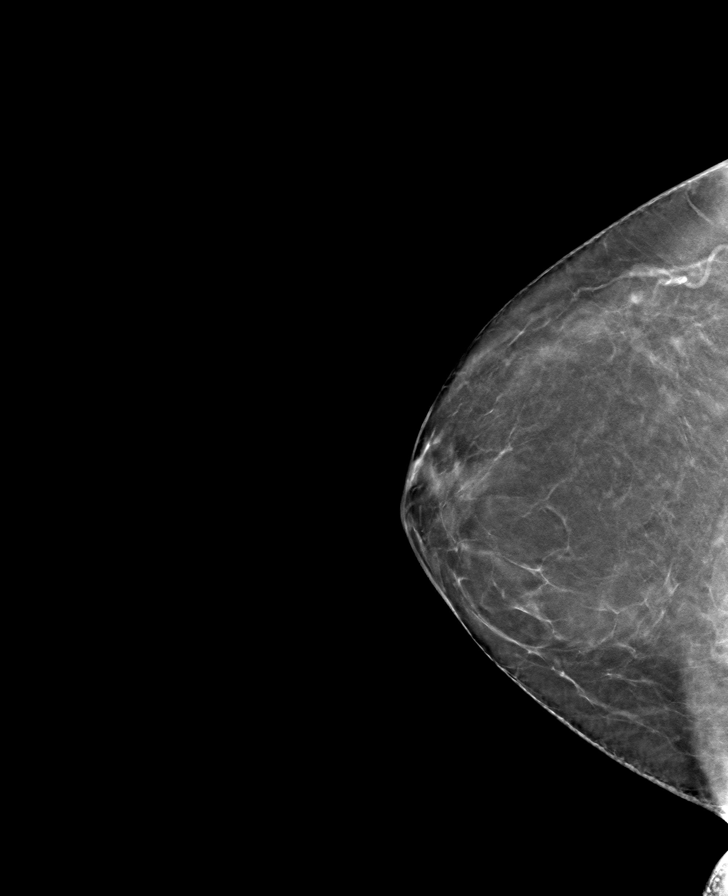

[R MLO tomo · tomo slice 39/77.0]
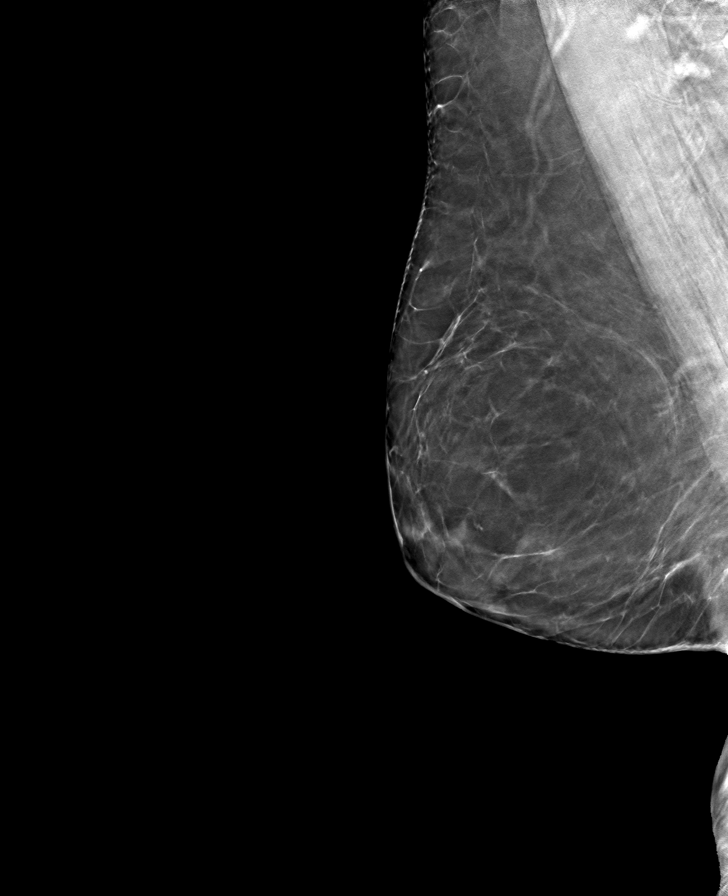

[L MLO tomo · tomo slice 44/87.0]
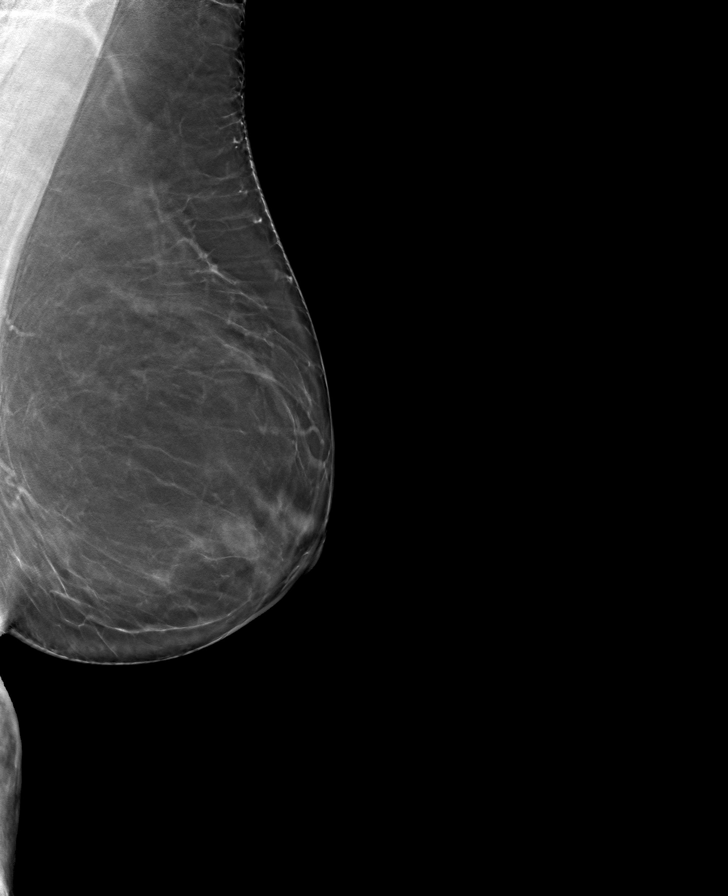

[8 of 24 positions shown; findings below may reference images not displayed]

ACR Breast Density Category b: There are scattered areas of
fibroglandular density.
FINDINGS: There are no findings suspicious for malignancy.
IMPRESSION: No mammographic evidence of malignancy. A result letter of this
screening mammogram will be mailed directly to the patient.

RECOMMENDATION:
Screening mammogram in one year. (Code:51-O-LD2)

BI-RADS CATEGORY  1: Negative.

## 2023-07-29 DIAGNOSIS — L821 Other seborrheic keratosis: Secondary | ICD-10-CM | POA: Diagnosis not present

## 2023-07-29 DIAGNOSIS — D2262 Melanocytic nevi of left upper limb, including shoulder: Secondary | ICD-10-CM | POA: Diagnosis not present

## 2023-07-29 DIAGNOSIS — L814 Other melanin hyperpigmentation: Secondary | ICD-10-CM | POA: Diagnosis not present

## 2023-07-29 DIAGNOSIS — D225 Melanocytic nevi of trunk: Secondary | ICD-10-CM | POA: Diagnosis not present

## 2023-07-29 DIAGNOSIS — M7130 Other bursal cyst, unspecified site: Secondary | ICD-10-CM | POA: Diagnosis not present

## 2023-07-29 DIAGNOSIS — L570 Actinic keratosis: Secondary | ICD-10-CM | POA: Diagnosis not present

## 2023-08-07 DIAGNOSIS — H1089 Other conjunctivitis: Secondary | ICD-10-CM | POA: Diagnosis not present

## 2023-08-19 ENCOUNTER — Telehealth: Payer: Self-pay | Admitting: Family Medicine

## 2023-08-19 NOTE — Telephone Encounter (Signed)
Copied from CRM [id number]. Topic: General - Other >> Aug 19, 2023 11:38 AM Truddie Crumble wrote: Reason for CRM: Pt called stating she would like to get labs done before her heart appointment on 2/6. Pt stated the office does her labs for the heart doctor

## 2023-08-19 NOTE — Telephone Encounter (Signed)
**Note De-identified  [place] Obfuscation** Please advise 

## 2023-08-20 ENCOUNTER — Encounter: Payer: Self-pay | Admitting: Family Medicine

## 2023-08-20 NOTE — Telephone Encounter (Signed)
 Can we clarify what labs she needs?  Ashley Dyer. Jimmey Ralph, MD 08/20/2023 12:50 PM

## 2023-08-20 NOTE — Telephone Encounter (Signed)
 Ok to order CMET & a lipid panel ordered ? Please advise

## 2023-08-21 ENCOUNTER — Other Ambulatory Visit: Payer: Self-pay | Admitting: *Deleted

## 2023-08-21 ENCOUNTER — Telehealth: Payer: Self-pay

## 2023-08-21 ENCOUNTER — Encounter (HOSPITAL_BASED_OUTPATIENT_CLINIC_OR_DEPARTMENT_OTHER): Payer: Self-pay

## 2023-08-21 DIAGNOSIS — I2510 Atherosclerotic heart disease of native coronary artery without angina pectoris: Secondary | ICD-10-CM

## 2023-08-21 NOTE — Telephone Encounter (Signed)
 CMET and Lipid panel order placed  Patient has a lab visit on 01/21/20245

## 2023-08-21 NOTE — Telephone Encounter (Unsigned)
 Copied from CRM [id number]. Topic: Clinical - Medical Advice >> Aug 20, 2023  1:09 PM Moise Anes wrote: Patient is confused about the lab work orders that she need.Slater Duncan the Cardiologist @ [facility] w/ Dr. Senaida Dama is saying that there's multiple lab orders are pending .Aaron Aas Need a   CMET & a lipid panel ordered if those other orders won't be done.Aaron Aas

## 2023-08-21 NOTE — Telephone Encounter (Signed)
 Copied from CRM [id number]. Topic: Clinical - Medical Advice >> Aug 20, 2023  1:09 PM Moise Anes wrote: Patient is confused about the lab work orders that she need.Slater Duncan the Cardiologist @ [facility] w/ Dr. Senaida Dama is saying that there's multiple lab orders are pending .Aaron Aas Need a   CMET & a lipid panel ordered if those other orders won't be done..  Called pt and advised why future labs were entered and explained these were from visit she had in July 2024 with PCP for CPE. Pt verbalized understanding and scheduled fasting lab appt with patient to be completed. No further action needed.

## 2023-08-27 ENCOUNTER — Other Ambulatory Visit (INDEPENDENT_AMBULATORY_CARE_PROVIDER_SITE_OTHER): Payer: Medicare Other

## 2023-08-27 DIAGNOSIS — R739 Hyperglycemia, unspecified: Secondary | ICD-10-CM

## 2023-08-27 DIAGNOSIS — I10 Essential (primary) hypertension: Secondary | ICD-10-CM | POA: Diagnosis not present

## 2023-08-27 DIAGNOSIS — E785 Hyperlipidemia, unspecified: Secondary | ICD-10-CM | POA: Diagnosis not present

## 2023-08-27 DIAGNOSIS — I2510 Atherosclerotic heart disease of native coronary artery without angina pectoris: Secondary | ICD-10-CM

## 2023-08-27 DIAGNOSIS — Z0001 Encounter for general adult medical examination with abnormal findings: Secondary | ICD-10-CM

## 2023-08-27 DIAGNOSIS — E2839 Other primary ovarian failure: Secondary | ICD-10-CM | POA: Diagnosis not present

## 2023-08-27 LAB — CBC
HCT: 41.6 % (ref 36.0–46.0)
Hemoglobin: 13.7 g/dL (ref 12.0–15.0)
MCHC: 32.8 g/dL (ref 30.0–36.0)
MCV: 94.6 fL (ref 78.0–100.0)
Platelets: 237 10*3/uL (ref 150.0–400.0)
RBC: 4.4 Mil/uL (ref 3.87–5.11)
RDW: 13.6 % (ref 11.5–15.5)
WBC: 4.9 10*3/uL (ref 4.0–10.5)

## 2023-08-27 LAB — COMPREHENSIVE METABOLIC PANEL
ALT: 16 U/L (ref 0–35)
AST: 25 U/L (ref 0–37)
Albumin: 4.4 g/dL (ref 3.5–5.2)
Alkaline Phosphatase: 65 U/L (ref 39–117)
BUN: 20 mg/dL (ref 6–23)
CO2: 28 meq/L (ref 19–32)
Calcium: 9.2 mg/dL (ref 8.4–10.5)
Chloride: 102 meq/L (ref 96–112)
Creatinine, Ser: 0.7 mg/dL (ref 0.40–1.20)
GFR: 83.12 mL/min (ref >60.00)
Glucose, Bld: 103 mg/dL — ABNORMAL HIGH (ref 70–99)
Potassium: 3.5 meq/L (ref 3.5–5.1)
Sodium: 138 meq/L (ref 135–145)
Total Bilirubin: 0.6 mg/dL (ref 0.2–1.2)
Total Protein: 7.3 g/dL (ref 6.0–8.3)

## 2023-08-27 LAB — HEMOGLOBIN A1C: Hgb A1c MFr Bld: 6.1 % (ref 4.6–6.5)

## 2023-08-27 LAB — LIPID PANEL
Cholesterol: 165 mg/dL (ref 0–200)
HDL: 62.2 mg/dL (ref >39.00)
LDL Cholesterol: 69 mg/dL (ref 0–99)
NonHDL: 103.07
Total CHOL/HDL Ratio: 3
Triglycerides: 168 mg/dL — ABNORMAL HIGH (ref 0.0–149.0)
VLDL: 33.6 mg/dL (ref 0.0–40.0)

## 2023-08-27 LAB — TSH: TSH: 3.45 u[IU]/mL (ref 0.35–5.50)

## 2023-08-27 LAB — VITAMIN D 25 HYDROXY (VIT D DEFICIENCY, FRACTURES): VITD: 55.88 ng/mL (ref 30.00–100.00)

## 2023-08-29 ENCOUNTER — Encounter: Payer: Self-pay | Admitting: Family Medicine

## 2023-08-29 NOTE — Progress Notes (Signed)
Sugar is a little bit elevated but stable.  Rest of her labs are all at goal.  Do not need to make any medication changes at this point.  She should continue to work on diet and exercise and we can recheck everything in a year.

## 2023-09-09 NOTE — Progress Notes (Signed)
 Cardiology Office Note:  .   Date:  09/13/2023  ID:  Ashley Dyer, DOB Sep 06, 1945, MRN [id number] PCP: Kennyth Worth HERO, MD  [facility] Providers Cardiologist:  Aleene Passe, MD Cardiology APP:  Percy Rosaline HERO, NP    Patient Profile: .      PMH Hypertension Coronary artery calcification CT calcium  score 07/2017 CAC Score 559 (92nd percentile) Statin intolerance Hyperlipidemia  Previous patient of Dr. Blanca she has been followed more recently by Dr. Passe.  She had intolerance to statin and was started on PCSK9 inhibitor therapy with good results.  Last cardiology clinic visit was 09/11/2022 with me.  She reported she was feeling well and continue to exercise on a consistent basis with walking 2.5 to 3 miles 5 days a week and additionally doing some stretching.  Home BP was well-controlled.  She like to follow a more plant-based diet but her boyfriend generally preferred more meat.  BP was well-controlled.  LDL was 74 on 03/23/2022.  Since she is intolerant to multiple statins and was already on Praluent  and ezetimibe , she was encouraged to follow a more plant-based diet.       History of Present Illness: .   Ashley Dyer is a very pleasant 78 y.o. female who is here today for follow-up of CAD. She is feeling well. Is concerned about recent weight gain and recent lab work that revealed elevated triglycerides. She has made lifestyle changes, including stopping daily wine consumption and walking four miles daily, but is still struggling with weight control. She has not experienced any chest pain or shortness of breath during her daily walks. Her EKG today shows subtle changes that could not rule out an anterior infarct. However, she has not experienced any symptoms suggestive of a heart attack. She has a family history of heart problems, with her father having had heart problems and her mother having had atrial fibrillation. With her diet, she is mindful of kidney stones  and osteoarthritis and has recently stopped consuming peanut butter due to its potential impact on kidney stones. She limits saturated fat. Blood pressure has been well controlled. She denies chest pain, shortness of breath, orthopnea, PND, edema, presyncope, syncope or palpitations.   Discussed the use of AI scribe software for clinical note transcription with the patient, who gave verbal consent to proceed.   ROS: See HPI       Studies Reviewed: SABRA   EKG Interpretation Date/Time:  Thursday September 12 2023 10:27:36 EST Ventricular Rate:  70 PR Interval:  144 QRS Duration:  82 QT Interval:  414 QTC Calculation: 447 R Axis:   -15  Text Interpretation: Normal sinus rhythm Cannot rule out Anterior infarct , age undetermined ,  Noted since last ECG Confirmed by Percy Rosaline [id number]) on 09/12/2023 10:34:37 AM     Risk Assessment/Calculations:             Physical Exam:   VS:  BP 128/82   Pulse 70   Ht 5' 6.5 (1.689 m)   Wt 166 lb 3.2 oz (75.4 kg)   SpO2 96%   BMI 26.42 kg/m    Wt Readings from Last 3 Encounters:  09/12/23 166 lb 3.2 oz (75.4 kg)  06/03/23 164 lb 3.2 oz (74.5 kg)  04/05/23 158 lb 6 oz (71.8 kg)    GEN: Well nourished, well developed in no acute distress NECK: No JVD; No carotid bruits CARDIAC: RRR, no murmurs, rubs, gallops RESPIRATORY:  Clear to auscultation without rales,  wheezing or rhonchi  ABDOMEN: Soft, non-tender, non-distended EXTREMITIES:  No edema; No deformity     ASSESSMENT AND PLAN: .    CAD: CT calcium  score 2018 with CAC score of 559 (92nd percentile). She  denies chest pain, dyspnea, or other symptoms concerning for angina, however she has EKG today that reveals potential previous anterior infarct, age undetermined.  She has been asymptomatic but due to history of elevated CAC and additional risk factors of hypertension and hyperlipidemia, we will get exercise stress test to rule out ischemia.  Abnormal EKG: Reviewed findings on EKG of  possible prior anterior infarct. This is new from last tracing 1 year ago. As noted above, she is asymptomatic but we will get exercise myoview  to r/o ischemia.   Hypertension: BP was initially elevated but improved on my recheck.  Renal function stable on labs completed 08/27/2023.  We will continue current anti-hypertensive therapy with losartan  and Maxide.  Hyperlipidemia LDL goal < 70: Lipid panel completed 08/27/23 with total cholesterol 165, triglycerides 168, HDL 62.2, and LDL 69.  She is overall eating a healthy diet and exercising daily.  Lipids are well-controlled. Tolerating medications with no concerning side effects. Continue ezetimibe  and Praluent     Informed Consent   Shared Decision Making/Informed Consent The risks [chest pain, shortness of breath, cardiac arrhythmias, dizziness, blood pressure fluctuations, myocardial infarction, stroke/transient ischemic attack, nausea, vomiting, allergic reaction, radiation exposure, metallic taste sensation and life-threatening complications (estimated to be 1 in 10,000)], benefits (risk stratification, diagnosing coronary artery disease, treatment guidance) and alternatives of a nuclear stress test were discussed in detail with Ms. Stuckert and she agrees to proceed.     Disposition:6 months with me  Signed, Rosaline Bane, NP-C

## 2023-09-12 ENCOUNTER — Ambulatory Visit: Payer: Medicare Other | Attending: Nurse Practitioner | Admitting: Nurse Practitioner

## 2023-09-12 ENCOUNTER — Encounter: Payer: Self-pay | Admitting: Nurse Practitioner

## 2023-09-12 VITALS — BP 128/82 | HR 70 | Ht 66.5 in | Wt 166.2 lb

## 2023-09-12 DIAGNOSIS — E785 Hyperlipidemia, unspecified: Secondary | ICD-10-CM | POA: Diagnosis not present

## 2023-09-12 DIAGNOSIS — I2510 Atherosclerotic heart disease of native coronary artery without angina pectoris: Secondary | ICD-10-CM | POA: Diagnosis not present

## 2023-09-12 DIAGNOSIS — I10 Essential (primary) hypertension: Secondary | ICD-10-CM | POA: Diagnosis not present

## 2023-09-12 DIAGNOSIS — R9431 Abnormal electrocardiogram [ECG] [EKG]: Secondary | ICD-10-CM | POA: Diagnosis not present

## 2023-09-12 NOTE — Patient Instructions (Signed)
 Medication Instructions:  Your physician recommends that you continue on your current medications as directed. Please refer to the Current Medication list given to you today.  *If you need a refill on your cardiac medications before your next appointment, please call your pharmacy*  Lab Work: If you have labs (blood work) drawn today and your tests are completely normal, you will receive your results only by: MyChart Message (if you have MyChart) OR A paper copy in the mail If you have any lab test that is abnormal or we need to change your treatment, we will call you to review the results.  Testing/Procedures: Your physician has requested that you have en exercise stress myoview . For further information please visit [email or web address] Please follow instruction sheet, as given.  Follow-Up: At [facility], you and your health needs are our priority.  As part of our continuing mission to provide you with exceptional heart care, we have created designated Provider Care Teams.  These Care Teams include your primary Cardiologist (physician) and Advanced Practice Providers (APPs -  Physician Assistants and Nurse Practitioners) who all work together to provide you with the care you need, when you need it.  We recommend signing up for the patient portal called MyChart.  Sign up information is provided on this After Visit Summary.  MyChart is used to connect with patients for Virtual Visits (Telemedicine).  Patients are able to view lab/test results, encounter notes, upcoming appointments, etc.  Non-urgent messages can be sent to your provider as well.   To learn more about what you can do with MyChart, go to [email or web address].    Your next appointment:   6 month(s)  Provider:   Rosaline Bane, NP    Other Instructions   1st Floor: - Lobby - Registration  - Pharmacy  - Lab - Cafe  2nd Floor: - PV Lab - Diagnostic Testing (echo, CT, nuclear med)  3rd Floor: -  [facility]acant  4th Floor: - TCTS (cardiothoracic surgery) - AFib Clinic - Structural Heart Clinic - Vascular Surgery  - Vascular Ultrasound  5th Floor: - HeartCare Cardiology (general and EP) - Clinical Pharmacy for coumadin, hypertension, lipid, weight-loss medications, and med management appointments    Valet parking services will be available as well.

## 2023-09-13 ENCOUNTER — Encounter: Payer: Self-pay | Admitting: Nurse Practitioner

## 2023-09-16 ENCOUNTER — Telehealth (HOSPITAL_COMMUNITY): Payer: Self-pay | Admitting: Radiology

## 2023-09-16 NOTE — Telephone Encounter (Signed)
 Patient given detailed instructions per Myocardial Perfusion Study Information Sheet for the test on 2/18 at 10:00. Patient notified to arrive 15 minutes early and that it is imperative to arrive on time for appointment to keep from having the test rescheduled.  If you need to cancel or reschedule your appointment, please call the office within 24 hours of your appointment. . Patient verbalized understanding. EHK

## 2023-09-19 ENCOUNTER — Encounter (HOSPITAL_COMMUNITY): Payer: Self-pay

## 2023-09-21 ENCOUNTER — Other Ambulatory Visit: Payer: Self-pay | Admitting: Cardiovascular Disease

## 2023-09-23 ENCOUNTER — Other Ambulatory Visit: Payer: Self-pay | Admitting: Family Medicine

## 2023-09-23 DIAGNOSIS — Z1231 Encounter for screening mammogram for malignant neoplasm of breast: Secondary | ICD-10-CM

## 2023-09-24 ENCOUNTER — Ambulatory Visit (HOSPITAL_COMMUNITY): Payer: Medicare Other | Attending: Cardiology

## 2023-09-24 DIAGNOSIS — I2510 Atherosclerotic heart disease of native coronary artery without angina pectoris: Secondary | ICD-10-CM | POA: Diagnosis not present

## 2023-09-24 DIAGNOSIS — R9431 Abnormal electrocardiogram [ECG] [EKG]: Secondary | ICD-10-CM | POA: Insufficient documentation

## 2023-09-24 DIAGNOSIS — I10 Essential (primary) hypertension: Secondary | ICD-10-CM | POA: Diagnosis not present

## 2023-09-24 LAB — MYOCARDIAL PERFUSION IMAGING
Angina Index: 1
Base ST Depression (mm): 0 mm
Duke Treadmill Score: 1
Estimated workload: 7
Exercise duration (min): 5 min
Exercise duration (sec): 16 s
LV dias vol: 58 mL (ref 46–106)
LV sys vol: 19 mL
MPHR: 143 {beats}/min
Nuc Stress EF: 67 %
Peak HR: 133 {beats}/min
Percent HR: 93 %
Rest HR: 70 {beats}/min
Rest Nuclear Isotope Dose: 10.2 mCi
SDS: 0
SRS: 0
SSS: 0
ST Depression (mm): 0 mm
Stress Nuclear Isotope Dose: 31.6 mCi
TID: 1.05

## 2023-09-24 MED ORDER — TECHNETIUM TC 99M TETROFOSMIN IV KIT
10.2000 | PACK | Freq: Once | INTRAVENOUS | Status: AC | PRN
  Administered 2023-09-24: 10.2 via INTRAVENOUS

## 2023-09-24 MED ORDER — TECHNETIUM TC 99M TETROFOSMIN IV KIT
31.6000 | PACK | Freq: Once | INTRAVENOUS | Status: AC | PRN
  Administered 2023-09-24: 31.6 via INTRAVENOUS

## 2023-09-28 ENCOUNTER — Other Ambulatory Visit: Payer: Self-pay | Admitting: Cardiovascular Disease

## 2023-10-05 ENCOUNTER — Other Ambulatory Visit: Payer: Self-pay | Admitting: Cardiovascular Disease

## 2023-10-12 ENCOUNTER — Other Ambulatory Visit: Payer: Self-pay | Admitting: Cardiovascular Disease

## 2023-10-30 ENCOUNTER — Ambulatory Visit: Payer: Medicare Other

## 2023-10-31 ENCOUNTER — Ambulatory Visit
Admission: RE | Admit: 2023-10-31 | Discharge: 2023-10-31 | Disposition: A | Discharge status: Home / Self Care | Source: Ambulatory Visit | Attending: Family Medicine | Admitting: Family Medicine

## 2023-10-31 DIAGNOSIS — Z1231 Encounter for screening mammogram for malignant neoplasm of breast: Secondary | ICD-10-CM | POA: Diagnosis not present

## 2023-12-02 ENCOUNTER — Encounter: Payer: Self-pay | Admitting: Family Medicine

## 2023-12-03 NOTE — Telephone Encounter (Signed)
**Note De-identified  [place] Obfuscation** Please advise 

## 2023-12-03 NOTE — Telephone Encounter (Signed)
 See note

## 2023-12-03 NOTE — Telephone Encounter (Signed)
 For patients over 75 with previous normal colonoscopies, it is up to them if they wish to pursue further colon cancer screening.  It is okay for her to forego this if she wishes.  We can discuss more at her next visit or she can schedule pulm to discuss further if she wishes.

## 2023-12-04 ENCOUNTER — Other Ambulatory Visit: Payer: Self-pay | Admitting: *Deleted

## 2023-12-04 DIAGNOSIS — Z1211 Encounter for screening for malignant neoplasm of colon: Secondary | ICD-10-CM

## 2023-12-04 NOTE — Telephone Encounter (Signed)
 I think that is very reasonable. Please place referral if needed.  Jinny Mounts. Daneil Dunker, MD 12/04/2023 10:02 AM

## 2023-12-04 NOTE — Telephone Encounter (Signed)
 Referral placed.

## 2023-12-19 ENCOUNTER — Encounter: Payer: Self-pay | Admitting: Family Medicine

## 2024-02-05 ENCOUNTER — Ambulatory Visit: Admitting: Gastroenterology

## 2024-02-05 ENCOUNTER — Encounter: Payer: Self-pay | Admitting: Gastroenterology

## 2024-02-05 VITALS — BP 100/60 | HR 88 | Ht 64.5 in | Wt 158.4 lb

## 2024-02-05 DIAGNOSIS — Z09 Encounter for follow-up examination after completed treatment for conditions other than malignant neoplasm: Secondary | ICD-10-CM

## 2024-02-05 DIAGNOSIS — Z1211 Encounter for screening for malignant neoplasm of colon: Secondary | ICD-10-CM

## 2024-02-05 DIAGNOSIS — Z800 Family history of malignant neoplasm of digestive organs: Secondary | ICD-10-CM | POA: Diagnosis not present

## 2024-02-05 DIAGNOSIS — Z860102 Personal history of hyperplastic colon polyps: Secondary | ICD-10-CM

## 2024-02-05 NOTE — Patient Instructions (Signed)
 Your provider has ordered Cologuard testing as an option for colon cancer screening. This is performed by [employer] and may be out of network with your insurance. PRIOR to completing the test, it is YOUR responsibility to contact your insurance about covered benefits for this test. Your out of pocket expense could be anywhere from $0.00 to $649.00.   When you call to check coverage with your insurer, please provide the following information:   -The ONLY provider of Cologuard is Optician, dispensing  - CPT code for Cologuard is [phone].  [facility] NPI # [id number]  -[employer] Tax ID # [id number]   We have already sent your demographic and insurance information to [employer] (phone number [phone]) and they should contact you within the next week regarding your test.   _______________________________________________________  If your blood pressure at your visit was 140/90 or greater, please contact your primary care physician to follow up on this.  _______________________________________________________  If you are age 63 or older, your body mass index should be between 23-30. Your Body mass index is 26.77 kg/m. If this is out of the aforementioned range listed, please consider follow up with your Primary Care Provider.  If you are age 68 or younger, your body mass index should be between 19-25. Your Body mass index is 26.77 kg/m. If this is out of the aformentioned range listed, please consider follow up with your Primary Care Provider.   ________________________________________________________  The [facility] providers would like to encourage you to use MYCHART to communicate with providers for non-urgent requests or questions.  Due to long hold times on the telephone, sending your provider a message by [facility] may be a faster and more efficient way to get a response.  Please allow 48 business hours for a response.  Please remember that  this is for non-urgent requests.  _______________________________________________________

## 2024-02-05 NOTE — Progress Notes (Signed)
 Ashley Dyer [id number] 1946-05-05   Chief Complaint: Discuss colonoscopy  Referring Provider: Kennyth Worth HERO, MD Primary GI MD: Sampson  HPI: Ashley Dyer is a 78 y.o. female with past medical history of anxiety, arthritis, asthma, kidney stones, HLD, HTN who presents today to discuss colonoscopy.    Last colonoscopy 2015 with [facility] and 10-year recall recommended.  Labs 08/27/2023: Normal CBC, mild elevation in glucose otherwise normal CMP, hemoglobin A1c 6.1, TSH 3.45, elevated triglycerides, normal vitamin D    Patient states she has had multiple colonoscopies and has always been advised to have a 10-year repeat.  She had hyperplastic polyps on last colonoscopy in 2015 with 10-year recall recommended.  [place]  She is unsure whether she wants to go through with another colonoscopy due to procedure risks but feels that she does want to have some type of screening.  States that her paternal grandfather had colon cancer diagnosed later in life.  She denies any first-degree relatives with history of colon cancer.  States that her father had severe constipation and her sister has diverticulosis/diverticulitis.  Patient has regular bowel movements daily and denies constipation, diarrhea, rectal bleeding, melena.    She denies any upper GI symptoms including nausea, vomiting, dysphagia, acid reflux, heartburn.  Previous GI Procedures/Imaging   Colonoscopy 12/02/2013 (Dr. Celestia, [facility]) - Diverticulosis in the sigmoid colon and in the descending colon - One benign-appearing 4 mm polyp in the rectum.  Resected and retrieved. - Internal hemorrhoids. 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   Walking pneumonia     Past Surgical History:  Procedure Laterality Date   CATARACT EXTRACTION Bilateral    EYE SURGERY     KIDNEY STONE SURGERY     NEPHROLITHOTOMY Right 10/22/2019   Procedure: RIGHT NEPHROLITHOTOMY PERCUTANEOUS WITH SURGEON ACCESS;  Surgeon: Cam Morene ORN, MD;  Location: [facility];  Service: Urology;  Laterality: Right;   TONSILLECTOMY     TOTAL HIP ARTHROPLASTY Left 05/11/2019   Procedure: LEFT TOTAL HIP ARTHROPLASTY ANTERIOR APPROACH;  Surgeon: Liam Lerner, MD;  Location: [facility];  Service: Orthopedics;  Laterality: Left;   TOTAL SHOULDER ARTHROPLASTY Right 06/13/2017   TOTAL SHOULDER ARTHROPLASTY Right 06/13/2017   Procedure: TOTAL SHOULDER ARTHROPLASTY;  Surgeon: Dozier Soulier, MD;  Location: [facility];  Service: Orthopedics;  Laterality: Right;  Right total shoulder arthroplasty   TOTAL SHOULDER ARTHROPLASTY Left 01/22/2019   Procedure: TOTAL SHOULDER ARTHROPLASTY;  Surgeon: Dozier Soulier, MD;  Location: [facility];  Service: Orthopedics;  Laterality: Left;   VAGINAL DELIVERY  1976    Current Outpatient Medications  Medication Sig Dispense Refill   Alirocumab  (PRALUENT ) 150 MG/ML SOAJ INJECT 1 PEN INTO THE SKIN EVERY 14 DAYS 2 mL 11   ASHWAGANDHA PO Take 1,000 mg by mouth in the morning and at bedtime.     Cholecalciferol  (VITAMIN D ) 50 MCG (2000 UT) tablet Take 4,000 Units by mouth daily.     Coenzyme Q10 (COQ-10 PO) Take 1 capsule by mouth daily.  cromolyn  (NASALCROM ) 5.2 MG/ACT nasal spray as needed.     Cyanocobalamin (VITAMIN B12) 1000 MCG TBCR daily.     ezetimibe  (ZETIA ) 10 MG tablet Take 1 tablet by mouth once daily 90 tablet 3   Ibuprofen-Acetaminophen  125-250 MG TABS Take 2 tablets by mouth daily as needed (pain).     losartan  (COZAAR ) 50 MG tablet Take 1 tablet by mouth once daily 90 tablet 3   tretinoin (RETIN-A) 0.025 % cream Apply topically at  bedtime.     triamterene -hydrochlorothiazide  (MAXZIDE ) 75-50 MG tablet TAKE 1 TABLET BY MOUTH EVERY OTHER DAY AND ALTERNATE WITH 1/2 (ONE-HALF) TABLET THE OTHER DAY 60 tablet 3   No current facility-administered medications for this visit.    Allergies as of 02/05/2024   (No Known Allergies)    Family History  Problem Relation Age of Onset   Atrial fibrillation Mother    Heart disease Father    Colon cancer Paternal Grandfather    Leukemia Brother    Breast cancer Neg Hx    BRCA 1/2 Neg Hx     Social History   Tobacco Use   Smoking status: Former    Current packs/day: 0.00    Types: Cigarettes    Quit date: 1990    Years since quitting: 35.5   Smokeless tobacco: Never  Vaping Use   Vaping status: Never Used  Substance Use Topics   Alcohol use: Yes    Alcohol/week: 8.0 - 10.0 standard drinks of alcohol    Types: 8 - 10 Glasses of wine per week    Comment: week    Drug use: No     Review of Systems:    Constitutional: No weight loss, fever, chills, weakness or fatigue Skin: No rash or itching Cardiovascular: No chest pain, chest pressure or palpitations   Respiratory: No SOB or cough Gastrointestinal: See HPI and otherwise negative Genitourinary: No dysuria or change in urinary frequency Neurological: No headache, dizziness or syncope Musculoskeletal: No new muscle or joint pain Hematologic: No bleeding or bruising    Physical Exam:  Vital signs: BP 100/60 (BP Location: Left Arm, Patient Position: Sitting, Cuff Size: Normal)   Pulse 88   Ht 5' 4.5 (1.638 m) Comment: height measured without shoes  Wt 158 lb 6 oz (71.8 kg)   BMI 26.77 kg/m    Constitutional: NAD, Well developed, Well nourished, alert and cooperative Head:  Normocephalic and atraumatic.  Eyes: No scleral icterus. Conjunctiva pink. Mouth: No oral lesions. Respiratory: Respirations even and unlabored. Lungs clear to auscultation bilaterally.  No wheezes, crackles, or rhonchi.   Cardiovascular:  Regular rate and rhythm. No murmurs. No peripheral edema. Gastrointestinal:  Soft, nondistended, nontender. No rebound or guarding. Normal bowel sounds. No appreciable masses or hepatomegaly. Rectal:  Not performed.  Neurologic:  Alert and oriented x4;  grossly normal neurologically.  Skin:   Dry and intact without significant lesions or rashes. Psychiatric: Oriented to person, place and time. Demonstrates good judgement and reason without abnormal affect or behaviors.   RELEVANT LABS AND IMAGING: CBC    Component Value Date/Time   WBC 4.9 08/27/2023 0825   RBC 4.40 08/27/2023 0825   HGB 13.7 08/27/2023 0825   HCT 41.6 08/27/2023 0825   PLT 237.0 08/27/2023 0825   MCV 94.6 08/27/2023 0825   MCH 32.1 03/23/2022 1604   MCHC 32.8 08/27/2023 0825   RDW 13.6 08/27/2023 0825   LYMPHSABS 1.6 04/05/2023 1133   MONOABS 0.6 04/05/2023 1133   EOSABS 1.0 (H)  04/05/2023 1133   BASOSABS 0.1 04/05/2023 1133    CMP     Component Value Date/Time   NA 138 08/27/2023 0825   NA 139 08/30/2020 0735   K 3.5 08/27/2023 0825   CL 102 08/27/2023 0825   CO2 28 08/27/2023 0825   GLUCOSE 103 (H) 08/27/2023 0825   BUN 20 08/27/2023 0825   BUN 23 08/30/2020 0735   CREATININE 0.70 08/27/2023 0825   CREATININE 0.83 03/23/2022 1604   CALCIUM  9.2 08/27/2023 0825   PROT 7.3 08/27/2023 0825   PROT 7.0 05/22/2021 0754   ALBUMIN 4.4 08/27/2023 0825   ALBUMIN 4.6 05/22/2021 0754   AST 25 08/27/2023 0825   ALT 16 08/27/2023 0825   ALKPHOS 65 08/27/2023 0825   BILITOT 0.6 08/27/2023 0825   BILITOT 0.6 05/22/2021 0754   GFRNONAA 76 08/30/2020 0735   GFRAA 88 08/30/2020 0735     Assessment/Plan:   Screening for colon cancer Patient here today to discuss further screening for colon cancer.  Has had multiple colonoscopies with most recent being done in 2015 with finding of 1 hyperplastic polyp.  Paternal grandfather had colon cancer diagnosed late in life and she denies any  first-degree relatives with colon cancer.  She feels that she would like to have some type of colon cancer screening but is hesitant to have colonoscopy in the absence of symptoms or history of adenomatous colon polyps, due to procedure risks.  We thoroughly discussed screening options and benefits/risks of colonoscopy.  Patient would like to have Cologuard test with follow-up colonoscopy if positive.  - Will order Cologuard. Plan for colonoscopy if positive.  Camie Furbish, PA-C [facility] 02/05/2024, 9:30 AM  Patient Care Team: Kennyth Worth HERO, MD as PCP - General (Family Medicine) Nahser, Aleene PARAS, MD as PCP - Cardiology (Cardiology) Kennyth Worth HERO, MD as Consulting Physician (Family Medicine) Blanca Elsie RAMAN, MD as Consulting Physician (Cardiology) Celestia Agent, MD (Inactive) as Consulting Physician (Gastroenterology) Joshua Blamer, MD as Consulting Physician (Dermatology) Cleotilde Vision  as Consulting Physician (Ophthalmology) Marget Lenis, MD as Consulting Physician (Obstetrics and Gynecology) Liam Lerner, MD as Consulting Physician (Orthopedic Surgery) Dozier Hazy, MD as Referring Physician Swinyer, Rosaline HERO, NP as Nurse Practitioner (Cardiology)

## 2024-02-14 ENCOUNTER — Ambulatory Visit: Payer: Self-pay

## 2024-02-14 DIAGNOSIS — Z1211 Encounter for screening for malignant neoplasm of colon: Secondary | ICD-10-CM | POA: Diagnosis not present
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
## 2024-02-17 ENCOUNTER — Encounter: Payer: Self-pay | Admitting: Family Medicine

## 2024-02-17 ENCOUNTER — Ambulatory Visit (INDEPENDENT_AMBULATORY_CARE_PROVIDER_SITE_OTHER): Admitting: Family Medicine

## 2024-02-17 VITALS — BP 111/66 | HR 72 | Temp 97.2°F | Ht 64.5 in | Wt 160.0 lb

## 2024-02-17 DIAGNOSIS — T23022A Burn of unspecified degree of single left finger (nail) except thumb, initial encounter: Secondary | ICD-10-CM

## 2024-02-17 DIAGNOSIS — T23031A Burn of unspecified degree of multiple right fingers (nail), not including thumb, initial encounter: Secondary | ICD-10-CM

## 2024-02-17 DIAGNOSIS — I10 Essential (primary) hypertension: Secondary | ICD-10-CM

## 2024-02-17 DIAGNOSIS — T300 Burn of unspecified body region, unspecified degree: Secondary | ICD-10-CM

## 2024-02-17 MED ORDER — SILVER SULFADIAZINE 1 % EX CREA
1.0000 | TOPICAL_CREAM | Freq: Every day | CUTANEOUS | 0 refills | Status: DC
Start: 2024-02-17 — End: 2024-04-16

## 2024-02-17 NOTE — Assessment & Plan Note (Addendum)
 At goal on losaran 50 mg daily and  maxide 75-50 and 37.5-25 on alternating days.
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
## 2024-02-20 LAB — COLOGUARD: COLOGUARD: NEGATIVE

## 2024-02-21 ENCOUNTER — Ambulatory Visit: Payer: Self-pay | Admitting: Gastroenterology

## 2024-04-16 ENCOUNTER — Ambulatory Visit (INDEPENDENT_AMBULATORY_CARE_PROVIDER_SITE_OTHER): Admitting: Family Medicine

## 2024-04-16 VITALS — BP 117/66 | HR 73 | Temp 97.3°F | Ht 64.5 in | Wt 156.2 lb

## 2024-04-16 DIAGNOSIS — Z0001 Encounter for general adult medical examination with abnormal findings: Secondary | ICD-10-CM | POA: Diagnosis not present

## 2024-04-16 DIAGNOSIS — E785 Hyperlipidemia, unspecified: Secondary | ICD-10-CM

## 2024-04-16 DIAGNOSIS — I10 Essential (primary) hypertension: Secondary | ICD-10-CM

## 2024-04-16 DIAGNOSIS — M1990 Unspecified osteoarthritis, unspecified site: Secondary | ICD-10-CM | POA: Diagnosis not present

## 2024-04-16 DIAGNOSIS — E559 Vitamin D deficiency, unspecified: Secondary | ICD-10-CM

## 2024-04-16 DIAGNOSIS — Z131 Encounter for screening for diabetes mellitus: Secondary | ICD-10-CM

## 2024-04-16 DIAGNOSIS — J4520 Mild intermittent asthma, uncomplicated: Secondary | ICD-10-CM

## 2024-04-16 NOTE — Assessment & Plan Note (Signed)
 At goal today on losartan  50 mg daily Maxide 25-50 and 37.5-25 on alternating days.  She will come back for labs.

## 2024-04-16 NOTE — Assessment & Plan Note (Signed)
 She is interested in decreasing her dose of lipid medications.  Currently following with cardiology for this.  She is on Praluent  and Zetia .  She will come back for labs.

## 2024-04-16 NOTE — Assessment & Plan Note (Signed)
 Stable on Flovent  as needed seasonally.  Does not need refill today.

## 2024-04-16 NOTE — Patient Instructions (Signed)
 It was very nice to see you today!  VISIT SUMMARY: Today, you had your annual physical exam. We discussed your weight loss, exercise routine, and dietary habits. We also reviewed your blood pressure, cholesterol levels, and breast health.  YOUR PLAN: WEIGHT LOSS AND EXERCISE: You have lost 9 pounds and are actively walking 2.5 to 3 miles a day. -Continue your current exercise regimen. -Maintain adherence to the Mediterranean diet with increased dietary fiber.  ESSENTIAL HYPERTENSION: Your blood pressure is well-controlled with your current medications. -Continue taking Losartan  50 mg daily. -Continue taking Maxide 75-50 and 37.5-25 on alternating days.  HYPERLIPIDEMIA: Your cholesterol level is currently 156 mg/dL. -Order blood work for cholesterol levels. -Discuss cholesterol management with your cardiology provider.  OSTEOARTHRITIS OF THE HANDS: You have difficulty with certain exercises due to arthritis in your hands. -Consider modifying exercises to reduce strain on your hands.  Return in about 1 year (around 04/16/2025) for Annual Physical.   Take care, Dr Kennyth  PLEASE NOTE:  If you had any lab tests, please let us  know if you have not heard back within a few days. You may see your results on mychart before we have a chance to review them but we will give you a call once they are reviewed by us .   If we ordered any referrals today, please let us  know if you have not heard from their office within the next week.   If you had any urgent prescriptions sent in today, please check with the pharmacy within an hour of our visit to make sure the prescription was transmitted appropriately.   Please try these tips to maintain a healthy lifestyle:  Eat at least 3 REAL meals and 1-2 snacks per day.  Aim for no more than 5 hours between eating.  If you eat breakfast, please do so within one hour of getting up.   Each meal should contain half fruits/vegetables, one quarter protein, and  one quarter carbs (no bigger than a computer mouse)  Cut down on sweet beverages. This includes juice, soda, and sweet tea.   Drink at least 1 glass of water  with each meal and aim for at least 8 glasses per day  Exercise at least 150 minutes every week.    Preventive Care 48 Years and Older, Female Preventive care refers to lifestyle choices and visits with your health care provider that can promote health and wellness. Preventive care visits are also called wellness exams. What can I expect for my preventive care visit? Counseling Your health care provider may ask you questions about your: Medical history, including: Past medical problems. Family medical history. Pregnancy and menstrual history. History of falls. Current health, including: Memory and ability to understand (cognition). Emotional well-being. Home life and relationship well-being. Sexual activity and sexual health. Lifestyle, including: Alcohol, nicotine or tobacco, and drug use. Access to firearms. Diet, exercise, and sleep habits. Work and work Astronomer. Sunscreen use. Safety issues such as seatbelt and bike helmet use. Physical exam Your health care provider will check your: Height and weight. These may be used to calculate your BMI (body mass index). BMI is a measurement that tells if you are at a healthy weight. Waist circumference. This measures the distance around your waistline. This measurement also tells if you are at a healthy weight and may help predict your risk of certain diseases, such as type 2 diabetes and high blood pressure. Heart rate and blood pressure. Body temperature. Skin for abnormal spots. What immunizations do I  need?  Vaccines are usually given at various ages, according to a schedule. Your health care provider will recommend vaccines for you based on your age, medical history, and lifestyle or other factors, such as travel or where you work. What tests do I  need? Screening Your health care provider may recommend screening tests for certain conditions. This may include: Lipid and cholesterol levels. Hepatitis C test. Hepatitis B test. HIV (human immunodeficiency virus) test. STI (sexually transmitted infection) testing, if you are at risk. Lung cancer screening. Colorectal cancer screening. Diabetes screening. This is done by checking your blood sugar (glucose) after you have not eaten for a while (fasting). Mammogram. Talk with your health care provider about how often you should have regular mammograms. BRCA-related cancer screening. This may be done if you have a family history of breast, ovarian, tubal, or peritoneal cancers. Bone density scan. This is done to screen for osteoporosis. Talk with your health care provider about your test results, treatment options, and if necessary, the need for more tests. Follow these instructions at home: Eating and drinking  Eat a diet that includes fresh fruits and vegetables, whole grains, lean protein, and low-fat dairy products. Limit your intake of foods with high amounts of sugar, saturated fats, and salt. Take vitamin and mineral supplements as recommended by your health care provider. Do not drink alcohol if your health care provider tells you not to drink. If you drink alcohol: Limit how much you have to 0-1 drink a day. Know how much alcohol is in your drink. In the [place] one drink equals one 12 oz bottle of beer (355 mL), one 5 oz glass of wine (148 mL), or one 1 oz glass of hard liquor (44 mL). Lifestyle Brush your teeth every morning and night with fluoride toothpaste. Floss one time each day. Exercise for at least 30 minutes 5 or more days each week. Do not use any products that contain nicotine or tobacco. These products include cigarettes, chewing tobacco, and vaping devices, such as e-cigarettes. If you need help quitting, ask your health care provider. Do not use drugs. If you are  sexually active, practice safe sex. Use a condom or other form of protection in order to prevent STIs. Take aspirin  only as told by your health care provider. Make sure that you understand how much to take and what form to take. Work with your health care provider to find out whether it is safe and beneficial for you to take aspirin  daily. Ask your health care provider if you need to take a cholesterol-lowering medicine (statin). Find healthy ways to manage stress, such as: Meditation, yoga, or listening to music. Journaling. Talking to a trusted person. Spending time with friends and family. Minimize exposure to UV radiation to reduce your risk of skin cancer. Safety Always wear your seat belt while driving or riding in a vehicle. Do not drive: If you have been drinking alcohol. Do not ride with someone who has been drinking. When you are tired or distracted. While texting. If you have been using any mind-altering substances or drugs. Wear a helmet and other protective equipment during sports activities. If you have firearms in your house, make sure you follow all gun safety procedures. What's next? Visit your health care provider once a year for an annual wellness visit. Ask your health care provider how often you should have your eyes and teeth checked. Stay up to date on all vaccines. This information is not intended to replace advice given  to you by your health care provider. Make sure you discuss any questions you have with your health care provider. Document Revised: 01/18/2021 Document Reviewed: 01/18/2021 Elsevier Patient Education  2024 [employer].

## 2024-04-16 NOTE — Progress Notes (Signed)
 Chief Complaint:  Ashley Dyer is a 78 y.o. female who presents today for her annual comprehensive physical exam.    Assessment/Plan:  Chronic Problems Addressed Today: Essential hypertension At goal today on losartan  50 mg daily Maxide 25-50 and 37.5-25 on alternating days.  She will come back for labs.  Dyslipidemia She is interested in decreasing her dose of lipid medications.  Currently following with cardiology for this.  She is on Praluent  and Zetia .  She will come back for labs.  Mild intermittent asthma without complication Stable on Flovent  as needed seasonally.  Does not need refill today.  Preventative Healthcare: Check labs.  She will get flu vaccine at the pharmacy.  Due for mammogram in 6 months.  Patient Counseling(The following topics were reviewed and/or handout was given):  -Nutrition: Stressed importance of moderation in sodium/caffeine intake, saturated fat and cholesterol, caloric balance, sufficient intake of fresh fruits, vegetables, and fiber.  -Stressed the importance of regular exercise.   -Substance Abuse: Discussed cessation/primary prevention of tobacco, alcohol, or other drug use; driving or other dangerous activities under the influence; availability of treatment for abuse.   -Injury prevention: Discussed safety belts, safety helmets, smoke detector, smoking near bedding or upholstery.   -Sexuality: Discussed sexually transmitted diseases, partner selection, use of condoms, avoidance of unintended pregnancy and contraceptive alternatives.   -Dental health: Discussed importance of regular tooth brushing, flossing, and dental visits.  -Health maintenance and immunizations reviewed. Please refer to Health maintenance section.  Return to care in 1 year for next preventative visit.     Subjective:  HPI:  She has no acute complaints today. Patient is here today for her  annual physical.  See assessment / plan for status of chronic  conditions.  Discussed the use of AI scribe software for clinical note transcription with the patient, who gave verbal consent to proceed.  History of Present Illness Ashley Dyer Ashley Dyer is a 78 year old female who presents for an annual physical exam.  She experiences nocturnal leg cramps and wonders if they may be related to an increase in her medication dosage. She is actively walking two and a half to three miles a day and has lost weight, now weighing 156 pounds, down from 160 pounds. She is concerned about the impact of her medications on her ability to exercise.  She is on losartan  and maxzide  for blood pressure management and reports her blood pressure has been stable. She is also on cholesterol-lowering medications, including a statin and Zetia . Her cholesterol level was 160 mg/dL, which has decreased to 156 mg/dL.  Her father had a history of heart attack, influencing her concern about cardiovascular health. She follows a Mediterranean diet, avoids alcohol, and is focused on maintaining her health through lifestyle changes.  Her sister is awaiting a kidney transplant and is currently on home dialysis, adding stress to her life as she balances her own health needs with family responsibilities.     Lifestyle Diet: Balanced.  Plenty of fruits and vegetables.  Staying consistent with Mediterranean diet.  Exercise: Somewhat limited due to orthopedic issues though is trying to stay active.     04/16/2024   10:47 AM  Depression screen PHQ 2/9  Decreased Interest 0  Down, Depressed, Hopeless 0  PHQ - 2 Score 0    Health Maintenance Due  Topic Date Due   Medicare Annual Wellness (AWV)  Never done   Hepatitis C Screening  Never done   Influenza Vaccine  03/06/2024  COVID-19 Vaccine (4 - 2025-26 season) 04/06/2024     ROS: Per HPI, otherwise a complete review of systems was negative.   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   Nephrolithiasis 10/22/2019   Recurrent nephrolithiasis 09/08/2019   S/P hip replacement, left 05/11/2019   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   Status post total shoulder arthroplasty, 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   Essential hypertension 09/08/2018   Dyslipidemia 09/08/2018   Allergic rhinitis 09/08/2018   Mild intermittent asthma without complication 09/08/2018   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   Past Surgical History:  Procedure Laterality Date   CATARACT EXTRACTION Bilateral    EYE SURGERY     KIDNEY STONE SURGERY     NEPHROLITHOTOMY Right 10/22/2019   Procedure: RIGHT NEPHROLITHOTOMY PERCUTANEOUS WITH SURGEON ACCESS;  Surgeon: Cam Morene ORN, MD;  Location: [facility];  Service: Urology;  Laterality: Right;   TONSILLECTOMY     TOTAL HIP ARTHROPLASTY Left 05/11/2019   Procedure: LEFT TOTAL HIP ARTHROPLASTY ANTERIOR APPROACH;  Surgeon: Liam Lerner, MD;  Location: [facility];  Service: Orthopedics;  Laterality: Left;   TOTAL SHOULDER ARTHROPLASTY Right 06/13/2017   TOTAL SHOULDER ARTHROPLASTY Right 06/13/2017   Procedure: TOTAL SHOULDER ARTHROPLASTY;  Surgeon: Dozier Soulier, MD;  Location: [facility];  Service: Orthopedics;  Laterality: Right;  Right total shoulder arthroplasty   TOTAL SHOULDER ARTHROPLASTY Left 01/22/2019   Procedure: TOTAL SHOULDER ARTHROPLASTY;  Surgeon: Dozier Soulier, MD;  Location: [facility];  Service: Orthopedics;  Laterality:  Left;   VAGINAL DELIVERY  1976    Family History  Problem Relation Age of Onset   Atrial fibrillation Mother    Heart disease Father    Diverticulitis Father    Diverticulitis Sister    Kidney disease Sister    Leukemia Brother    Colon cancer Paternal Grandfather    Breast cancer Neg Hx    BRCA 1/2 Neg Hx     Medications- reviewed and updated Current Outpatient Medications  Medication Sig Dispense Refill   Alirocumab  (PRALUENT ) 150 MG/ML SOAJ INJECT 1 PEN INTO THE SKIN EVERY 14 DAYS 2 mL 11   Coenzyme Q10 (COQ-10 PO) Take 1 capsule by mouth daily.     Cyanocobalamin (VITAMIN B12) 1000 MCG TBCR daily.     ezetimibe  (ZETIA ) 10 MG tablet Take 1 tablet by mouth once daily 90 tablet 3   Ibuprofen-Acetaminophen  125-250 MG TABS Take 2 tablets by mouth daily as needed (pain).     losartan  (COZAAR ) 50 MG tablet Take 1 tablet by mouth once daily 90 tablet 3   tretinoin (RETIN-A) 0.025 % cream Apply topically at bedtime.     triamterene -hydrochlorothiazide  (MAXZIDE ) 75-50 MG tablet TAKE 1 TABLET BY MOUTH EVERY OTHER DAY AND ALTERNATE WITH 1/2 (ONE-HALF) TABLET THE OTHER DAY 60 tablet 3   Vitamin D -Vitamin  K (VITAMIN K2-VITAMIN D3 PO) Take 1 capsule by mouth daily.     No current facility-administered medications for this visit.    Allergies-reviewed and updated Allergies  Allergen Reactions   Lipitor [Atorvastatin Calcium ]     Muscle aches    Social History   Socioeconomic History   Marital status: Single    Spouse name: Not on file   Number of children: 1   Years of education: Not on file   Highest education level: Not on file  Occupational History   Not on file  Tobacco Use   Smoking status: Never   Smokeless tobacco: Never  Vaping Use   Vaping status: Never Used  Substance and Sexual Activity   Alcohol use: Yes    Alcohol/week: 8.0 - 10.0 standard drinks of alcohol    Types: 8 - 10 Glasses of wine per week    Comment: week    Drug use: No   Sexual activity: Not  Currently  Other Topics Concern   Not on file  Social History Narrative   Not on file   Social Drivers of Health   Financial Resource Strain: Low Risk  (04/13/2024)   Overall Financial Resource Strain (CARDIA)    Difficulty of Paying Living Expenses: Not hard at all  Food Insecurity: No Food Insecurity (04/13/2024)   Hunger Vital Sign    Worried About Running Out of Food in the Last Year: Never true    Ran Out of Food in the Last Year: Never true  Transportation Needs: No Transportation Needs (04/13/2024)   PRAPARE - Administrator, Civil Service (Medical): No    Lack of Transportation (Non-Medical): No  Physical Activity: Unknown (04/13/2024)   Exercise Vital Sign    Days of Exercise per Week: 5 days    Minutes of Exercise per Session: Not on file  Stress: No Stress Concern Present (04/13/2024)   [employer] of Occupational Health - Occupational Stress Questionnaire    Feeling of Stress: Not at all  Social Connections: Unknown (04/13/2024)   Social Connection and Isolation Panel    Frequency of Communication with Friends and Family: Not on file    Frequency of Social Gatherings with Friends and Family: Not on file    Attends Religious Services: Not on file    Active Member of Clubs or Organizations: No    Attends Banker Meetings: Not on file    Marital Status: Not on file        Objective:  Physical Exam: BP 117/66   Pulse 73   Temp (!) 97.3 F (36.3 C) (Temporal)   Ht 5' 4.5 (1.638 m)   Wt 156 lb 3.2 oz (70.9 kg)   SpO2 95%   BMI 26.40 kg/m   Body mass index is 26.4 kg/m. Wt Readings from Last 3 Encounters:  04/16/24 156 lb 3.2 oz (70.9 kg)  02/17/24 160 lb (72.6 kg)  02/05/24 158 lb 6 oz (71.8 kg)   Gen: NAD, resting comfortably HEENT: TMs normal bilaterally. OP clear. No thyromegaly noted.  CV: RRR with no murmurs appreciated Pulm: NWOB, CTAB with no crackles, wheezes, or rhonchi GI: Normal bowel sounds present. Soft, Nontender,  Nondistended. MSK: no edema, cyanosis, or clubbing noted Skin: warm, dry Neuro: CN2-12 grossly intact. Strength 5/5 in upper and lower extremities. Reflexes symmetric and intact bilaterally.  Psych: Normal affect and thought content     Dennys Traughber M. Kennyth, MD 04/16/2024 11:26 AM

## 2024-04-21 ENCOUNTER — Other Ambulatory Visit (INDEPENDENT_AMBULATORY_CARE_PROVIDER_SITE_OTHER)

## 2024-04-21 DIAGNOSIS — E559 Vitamin D deficiency, unspecified: Secondary | ICD-10-CM

## 2024-04-21 DIAGNOSIS — Z131 Encounter for screening for diabetes mellitus: Secondary | ICD-10-CM

## 2024-04-21 DIAGNOSIS — Z0001 Encounter for general adult medical examination with abnormal findings: Secondary | ICD-10-CM

## 2024-04-21 DIAGNOSIS — E785 Hyperlipidemia, unspecified: Secondary | ICD-10-CM

## 2024-04-21 LAB — COMPREHENSIVE METABOLIC PANEL WITH GFR
ALT: 16 U/L (ref 0–35)
AST: 25 U/L (ref 0–37)
Albumin: 4.5 g/dL (ref 3.5–5.2)
Alkaline Phosphatase: 69 U/L (ref 39–117)
BUN: 24 mg/dL — ABNORMAL HIGH (ref 6–23)
CO2: 29 meq/L (ref 19–32)
Calcium: 9.8 mg/dL (ref 8.4–10.5)
Chloride: 100 meq/L (ref 96–112)
Creatinine, Ser: 0.77 mg/dL (ref 0.40–1.20)
GFR: 73.8 mL/min (ref >60.00)
Glucose, Bld: 102 mg/dL — ABNORMAL HIGH (ref 70–99)
Potassium: 3.6 meq/L (ref 3.5–5.1)
Sodium: 139 meq/L (ref 135–145)
Total Bilirubin: 0.7 mg/dL (ref 0.2–1.2)
Total Protein: 7.3 g/dL (ref 6.0–8.3)

## 2024-04-21 LAB — LIPID PANEL
Cholesterol: 149 mg/dL (ref 0–200)
HDL: 48.5 mg/dL (ref >39.00)
LDL Cholesterol: 57 mg/dL (ref 0–99)
NonHDL: 100.37
Total CHOL/HDL Ratio: 3
Triglycerides: 219 mg/dL — ABNORMAL HIGH (ref 0.0–149.0)
VLDL: 43.8 mg/dL — ABNORMAL HIGH (ref 0.0–40.0)

## 2024-04-21 LAB — CBC
HCT: 40.3 % (ref 36.0–46.0)
Hemoglobin: 13.7 g/dL (ref 12.0–15.0)
MCHC: 34 g/dL (ref 30.0–36.0)
MCV: 93.9 fl (ref 78.0–100.0)
Platelets: 227 K/uL (ref 150.0–400.0)
RBC: 4.29 Mil/uL (ref 3.87–5.11)
RDW: 13 % (ref 11.5–15.5)
WBC: 7.2 K/uL (ref 4.0–10.5)

## 2024-04-21 LAB — TSH: TSH: 1.85 u[IU]/mL (ref 0.35–5.50)

## 2024-04-21 LAB — HEMOGLOBIN A1C: Hgb A1c MFr Bld: 6.2 % (ref 4.6–6.5)

## 2024-04-21 LAB — VITAMIN D 25 HYDROXY (VIT D DEFICIENCY, FRACTURES): VITD: 72.28 ng/mL (ref 30.00–100.00)

## 2024-04-23 ENCOUNTER — Ambulatory Visit: Payer: Self-pay | Admitting: Family Medicine

## 2024-04-23 NOTE — Progress Notes (Signed)
 Her A1c is borderline elevated but similar to the last time that we checked.  She does not need to start meds for this however should continue to work on diet and exercise.  All of her other labs are stable.  Do not need to make any changes to her treatment plan at this time.  We can recheck everything in a year.

## 2024-07-16 ENCOUNTER — Ambulatory Visit: Admitting: Family Medicine

## 2024-07-16 ENCOUNTER — Ambulatory Visit: Payer: Self-pay

## 2024-07-16 VITALS — BP 136/70 | HR 83 | Temp 97.8°F | Ht 64.5 in | Wt 156.4 lb

## 2024-07-16 DIAGNOSIS — R519 Headache, unspecified: Secondary | ICD-10-CM

## 2024-07-16 NOTE — Progress Notes (Signed)
 Acute Office Visit  Subjective:     Patient ID: Ashley Dyer, female    DOB: May 26, 1946, 78 y.o.   MRN: [id number]  Chief Complaint  Patient presents with   Sinus Problem    2 days ago, woke up with bad pressure on L head, blurry vision. Second, same thing happen. Last night, pressure on head. Made a call to her eye dr.- its normal, ocular migraine- nothing to worry about. Denied sore throat, cough, chills, fever. Yes to tired. Pt reports veins are prominent on left temporal coming down to left cheek. A month ago.     Sinus Problem  Discussed the use of AI scribe software for clinical note transcription with the patient, who gave verbal consent to proceed.  History of Present Illness   Ashley Dyer Ashley Dyer is a 78 year old female who presents with new onset headaches and blurry vision.  She has had throbbing headaches for the past three days, mainly in the left forehead. They occur in the morning and at night, last at least 20 minutes, and are severe enough to wake her from sleep. Tylenol  gives partial relief.  She has associated blurry vision, worse in the left eye, and nasal drainage without congestion. She denies fever, chills, sore throat, or ear pain. She has not had similar headaches or migraines in the past.  Her medical history includes osteoarthritis and well-controlled high blood pressure treated with medication.       Review of Systems  All other systems reviewed and are negative.       Objective:    BP 136/70 (BP Location: Left Arm, Patient Position: Sitting, Cuff Size: Normal)   Pulse 83   Temp 97.8 F (36.6 C) (Oral)   Ht 5' 4.5 (1.638 m)   Wt 156 lb 6.4 oz (70.9 kg)   SpO2 97%   BMI 26.43 kg/m    Physical Exam Vitals reviewed.  Constitutional:      Appearance: Normal appearance. She is normal weight.  HENT:     Right Ear: Tympanic membrane normal.     Left Ear: Tympanic membrane normal.  Eyes:     Conjunctiva/sclera: Conjunctivae  normal.  Cardiovascular:     Rate and Rhythm: Normal rate and regular rhythm.     Heart sounds: Normal heart sounds.  Pulmonary:     Effort: Pulmonary effort is normal.     Breath sounds: Normal breath sounds. No wheezing.  Abdominal:     General: Bowel sounds are normal.  Neurological:     General: No focal deficit present.     Mental Status: She is alert and oriented to person, place, and time. Mental status is at baseline.     Motor: No weakness.     Gait: Gait normal.  Psychiatric:        Mood and Affect: Mood normal.        Behavior: Behavior normal.     No results found for any visits on 07/16/24.      Assessment & Plan:   Problem List Items Addressed This Visit   None Visit Diagnoses       Sinus headache    -  Primary   Relevant Orders   Sedimentation Rate   C-reactive Protein      Assessment and Plan    New onset headache, rule out temporal arteritis New onset headache with throbbing pain on the left side of the forehead, associated with blurry vision. Symptoms have persisted for three  days, with episodes occurring in the morning and at night. No prior history of headaches or migraines. Differential diagnosis includes temporal arteritis, sinus headache, and ocular migraines. Temporal arteritis is considered due to the presence of nodular areas on the side of the head and blurry vision. Sinus headache is less likely due to the absence of typical symptoms such as fever, chills, and nasal congestion. Ocular migraines are considered but are less likely given the age and new onset nature of the symptoms. Temporal arteritis is rare but can occur, and if present, would require treatment with steroids or anti-inflammatories. I did not see any redness of the skin, no swelling and there was no tenderness to palpation of the temples BL.  - Ordered ESR and CRP to rule out temporal arteritis. - Advised use of Tylenol  every 4-6 hours as needed for pain management. - Discussed the  option of using Advil or dual action medications for pain management. - Instructed to schedule a lab appointment for blood work. - Advised to monitor symptoms and report if headaches persist or if blurry vision returns. - Will consider CT or MRI if symptoms persist or worsen.       No orders of the defined types were placed in this encounter.   No follow-ups on file.  Heron CHRISTELLA Sharper, MD

## 2024-07-16 NOTE — Telephone Encounter (Signed)
 FYI Only or Action Required?: FYI only for provider: appointment scheduled on today.  Patient was last seen in primary care on 04/16/2024 by Ashley Worth HERO, MD.  Called Nurse Triage reporting Headache. Possible sinus issues.  Symptoms began several days ago.  Interventions attempted: Other: Called ophthalmologist d/t eye pain. Eye are swollen. Tylenol  and nose spray.  Symptoms are: Changing.  Triage Disposition: See Physician Within 24 Hours  Patient/caregiver understands and will follow disposition?: Yes            Copied from CRM #[id number]. Topic: Clinical - Red Word Triage >> Jul 16, 2024  8:12 AM Treva T wrote: [facility] that prompted transfer to Nurse Triage: Pt states she has increased pressure in her head on the left side, states it has now turned into a headache/migraine.   Also reports she pain and pressure woke her up last night and had to take medication in order to sleep last night as pressure was unbearable.  Pt is requesting an appt to be seen as soon as possible.   Ph. [phone] Reason for Disposition  [1] MODERATE headache (e.g., interferes with normal activities) AND [2] present > 24 hours AND [3] unexplained  (Exceptions: Pain medicines not tried, typical migraine, or headache part of viral illness.)  Answer Assessment - Initial Assessment Questions 1. LOCATION: Where does it hurt?      Pain has moved around  - had pressure left side of head, andleft eye. Then pain moved to back of head and top of head. Now seems more Sinus pain 2. ONSET: When did the headache start? (e.g., minutes, hours, days)      2 few days ago 3. PATTERN: Does the pain come and go, or has it been constant since it started?     yes 4. SEVERITY: How bad is the pain? and What does it keep you from doing?  (e.g., Scale 1-10; mild, moderate, or severe)     moderate 5. RECURRENT SYMPTOM: Have you ever had headaches before? If Yes, ask: When was the last time? and What  happened that time?      yes 6. CAUSE: What do you think is causing the headache?     Ocular migraine?  Protocols used: [facility]

## 2024-07-17 ENCOUNTER — Encounter: Payer: Self-pay | Admitting: Family Medicine

## 2024-07-17 ENCOUNTER — Other Ambulatory Visit (INDEPENDENT_AMBULATORY_CARE_PROVIDER_SITE_OTHER)

## 2024-07-17 ENCOUNTER — Other Ambulatory Visit

## 2024-07-17 ENCOUNTER — Ambulatory Visit: Payer: Self-pay | Admitting: Family Medicine

## 2024-07-17 DIAGNOSIS — R519 Headache, unspecified: Secondary | ICD-10-CM | POA: Diagnosis not present

## 2024-07-17 LAB — SEDIMENTATION RATE: Sed Rate: 53 mm/h — ABNORMAL HIGH (ref 0–30)

## 2024-07-17 LAB — C-REACTIVE PROTEIN: CRP: 3.3 mg/dL (ref 0.5–20.0)

## 2024-07-20 ENCOUNTER — Encounter: Payer: Self-pay | Admitting: Family Medicine

## 2024-07-20 NOTE — Telephone Encounter (Signed)
 Patient need Triage

## 2024-07-20 NOTE — Telephone Encounter (Signed)
 Duplicated

## 2024-07-20 NOTE — Telephone Encounter (Signed)
 Can we call and check on patient? Ok to send to triage if needed. Dr Heddie notes are not in yet but if she is having headache with vision changes then recommend she be seen ASAP.

## 2024-07-20 NOTE — Telephone Encounter (Signed)
 LVM to triage pt regarding pt stating headaches & Blurry/Double Vision

## 2024-07-20 NOTE — Telephone Encounter (Signed)
 Please advise

## 2024-07-21 ENCOUNTER — Ambulatory Visit (HOSPITAL_COMMUNITY): Admission: EM | Admit: 2024-07-21

## 2024-07-21 ENCOUNTER — Encounter (HOSPITAL_COMMUNITY): Payer: Self-pay | Admitting: Radiology

## 2024-07-21 ENCOUNTER — Observation Stay (HOSPITAL_COMMUNITY)
Admission: EM | Admit: 2024-07-21 | Discharge: 2024-07-23 | Disposition: A | Discharge status: Home / Self Care | Source: Ambulatory Visit

## 2024-07-21 ENCOUNTER — Ambulatory Visit: Admitting: Family Medicine

## 2024-07-21 ENCOUNTER — Telehealth: Payer: Self-pay | Admitting: *Deleted

## 2024-07-21 ENCOUNTER — Emergency Department (HOSPITAL_COMMUNITY)

## 2024-07-21 ENCOUNTER — Other Ambulatory Visit: Payer: Self-pay

## 2024-07-21 ENCOUNTER — Ambulatory Visit: Admitting: Physician Assistant

## 2024-07-21 DIAGNOSIS — R519 Headache, unspecified: Secondary | ICD-10-CM | POA: Diagnosis present

## 2024-07-21 DIAGNOSIS — Z96612 Presence of left artificial shoulder joint: Secondary | ICD-10-CM | POA: Diagnosis not present

## 2024-07-21 DIAGNOSIS — E785 Hyperlipidemia, unspecified: Secondary | ICD-10-CM | POA: Insufficient documentation

## 2024-07-21 DIAGNOSIS — Z79899 Other long term (current) drug therapy: Secondary | ICD-10-CM | POA: Insufficient documentation

## 2024-07-21 DIAGNOSIS — I10 Essential (primary) hypertension: Secondary | ICD-10-CM | POA: Diagnosis not present

## 2024-07-21 DIAGNOSIS — Z96642 Presence of left artificial hip joint: Secondary | ICD-10-CM | POA: Insufficient documentation

## 2024-07-21 DIAGNOSIS — J45909 Unspecified asthma, uncomplicated: Secondary | ICD-10-CM | POA: Diagnosis not present

## 2024-07-21 DIAGNOSIS — H532 Diplopia: Secondary | ICD-10-CM

## 2024-07-21 DIAGNOSIS — Z96611 Presence of right artificial shoulder joint: Secondary | ICD-10-CM | POA: Insufficient documentation

## 2024-07-21 DIAGNOSIS — H539 Unspecified visual disturbance: Secondary | ICD-10-CM

## 2024-07-21 LAB — BASIC METABOLIC PANEL WITH GFR
Anion gap: 13 (ref 5–15)
BUN: 16 mg/dL (ref 8–23)
CO2: 26 mmol/L (ref 22–32)
Calcium: 10.1 mg/dL (ref 8.9–10.3)
Chloride: 99 mmol/L (ref 98–111)
Creatinine, Ser: 0.75 mg/dL (ref 0.44–1.00)
GFR, Estimated: >60 mL/min (ref >60)
Glucose, Bld: 91 mg/dL (ref 70–99)
Potassium: 3.6 mmol/L (ref 3.5–5.1)
Sodium: 138 mmol/L (ref 135–145)

## 2024-07-21 LAB — CBC WITH DIFFERENTIAL/PLATELET
Abs Immature Granulocytes: 0.03 K/uL (ref 0.00–0.07)
Basophils Absolute: 0.1 K/uL (ref 0.0–0.1)
Basophils Relative: 1 %
Eosinophils Absolute: 0.2 K/uL (ref 0.0–0.5)
Eosinophils Relative: 3 %
HCT: 40.8 % (ref 36.0–46.0)
Hemoglobin: 13.7 g/dL (ref 12.0–15.0)
Immature Granulocytes: 0 %
Lymphocytes Relative: 18 %
Lymphs Abs: 1.4 K/uL (ref 0.7–4.0)
MCH: 31.4 pg (ref 26.0–34.0)
MCHC: 33.6 g/dL (ref 30.0–36.0)
MCV: 93.4 fL (ref 80.0–100.0)
Monocytes Absolute: 0.8 K/uL (ref 0.1–1.0)
Monocytes Relative: 10 %
Neutro Abs: 5.2 K/uL (ref 1.7–7.7)
Neutrophils Relative %: 68 %
Platelets: 270 K/uL (ref 150–400)
RBC: 4.37 MIL/uL (ref 3.87–5.11)
RDW: 12.4 % (ref 11.5–15.5)
WBC: 7.7 K/uL (ref 4.0–10.5)
nRBC: 0 % (ref 0.0–0.2)

## 2024-07-21 LAB — SEDIMENTATION RATE: Sed Rate: 88 mm/h — ABNORMAL HIGH (ref 0–22)

## 2024-07-21 LAB — URINALYSIS, ROUTINE W REFLEX MICROSCOPIC
Bilirubin Urine: NEGATIVE
Glucose, UA: NEGATIVE mg/dL
Ketones, ur: NEGATIVE mg/dL
Nitrite: NEGATIVE
Protein, ur: NEGATIVE mg/dL
Specific Gravity, Urine: 1.008 (ref 1.005–1.030)
pH: 7 (ref 5.0–8.0)

## 2024-07-21 LAB — C-REACTIVE PROTEIN: CRP: 5.1 mg/dL — ABNORMAL HIGH (ref <1.0)

## 2024-07-21 MED ORDER — GADOBUTROL 1 MMOL/ML IV SOLN
7.0000 mL | Freq: Once | INTRAVENOUS | Status: AC | PRN
  Administered 2024-07-21: 22:00:00 7 mL via INTRAVENOUS

## 2024-07-21 MED ORDER — ACETAMINOPHEN 500 MG PO TABS
1000.0000 mg | ORAL_TABLET | Freq: Once | ORAL | Status: AC
  Administered 2024-07-21: 18:00:00 1000 mg via ORAL
  Filled 2024-07-21: qty 2

## 2024-07-21 NOTE — Telephone Encounter (Unsigned)
 Copied from CRM #[id number]. Topic: Clinical - Lab/Test Results >> Jul 17, 2024  4:28 PM Alexandria E wrote: Reason for CRM: Patient just received her lab results on MyChart and would like to speak with PCP about them. Provider did not notate on them yet and was advised by CAL that him and his nurse were gone for the day. Please call patient on Monday do discuss.

## 2024-07-21 NOTE — ED Notes (Signed)
 Patient report was given to the charge nurse,[place] at [facility] ED

## 2024-07-21 NOTE — ED Notes (Signed)
 Multiple IV attempts unsuccessful; blood work drawn

## 2024-07-21 NOTE — ED Provider Notes (Signed)
 [place] EMERGENCY DEPARTMENT AT [facility] Provider Note  CSN: [id number] Arrival date & time: 07/21/24 1040  Chief Complaint(s) Headache and Blurred Vision  HPI Ashley Dyer is a 78 y.o. female history of hypertension, hyperlipidemia presenting to the emergency department with headache.  Patient reports headache for few days, worse on the left side.  Also reports some diplopia worse with looking to the left, occasional blurry vision but no eye pain or loss of vision.  No numbness or tingling, weakness, head injury, trouble swallowing, trouble breathing.  Reports some soreness on the bilateral temples, left greater than right.  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   Walking pneumonia    Patient Active Problem List   Diagnosis Date Noted   Osteopenia 05/20/2023   Coronary artery calcification 02/07/2021   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 Authorizing Provider  Alirocumab  (PRALUENT ) 150 MG/ML SOAJ INJECT 1 PEN INTO THE SKIN  EVERY 14 DAYS 10/08/23   Nahser, Aleene PARAS, MD  Coenzyme Q10 (COQ-10 PO) Take 1 capsule by mouth daily.    [provider]  Cyanocobalamin (VITAMIN B12) 1000 MCG TBCR daily.    [provider]  ezetimibe  (ZETIA ) 10 MG tablet Take 1 tablet by mouth once daily 10/14/23   Swinyer, Rosaline HERO, NP  Ibuprofen-Acetaminophen  125-250 MG TABS Take 2 tablets by mouth daily as needed (pain).    [provider]  losartan  (COZAAR ) 50 MG tablet Take 1 tablet by mouth once daily 09/23/23   Nahser, Aleene PARAS, MD  tretinoin (RETIN-A) 0.025 % cream Apply topically at bedtime. Patient not taking: Reported on 07/16/2024 03/26/23   [provider]  triamterene -hydrochlorothiazide  (MAXZIDE ) 75-50 MG tablet TAKE 1 TABLET BY MOUTH EVERY OTHER DAY AND ALTERNATE WITH 1/2 (ONE-HALF) TABLET THE OTHER DAY 09/30/23   Nahser, Aleene PARAS, MD  Vitamin D -Vitamin K (VITAMIN K2-VITAMIN D3 PO) Take 1 capsule by mouth daily.    [provider]  Past Surgical History Past Surgical History:  Procedure Laterality Date   CATARACT EXTRACTION Bilateral    EYE SURGERY     KIDNEY STONE SURGERY     NEPHROLITHOTOMY Right 10/22/2019   Procedure: RIGHT NEPHROLITHOTOMY PERCUTANEOUS WITH SURGEON ACCESS;  Surgeon: Cam Morene ORN, MD;  Location: [facility];  Service: Urology;  Laterality: Right;   TONSILLECTOMY     TOTAL HIP ARTHROPLASTY Left 05/11/2019   Procedure: LEFT TOTAL HIP ARTHROPLASTY ANTERIOR APPROACH;  Surgeon: Liam Lerner, MD;  Location: [facility];  Service: Orthopedics;  Laterality: Left;   TOTAL SHOULDER ARTHROPLASTY Right 06/13/2017   TOTAL SHOULDER ARTHROPLASTY Right 06/13/2017   Procedure: TOTAL SHOULDER ARTHROPLASTY;  Surgeon: Dozier Soulier, MD;  Location: [facility];  Service: Orthopedics;  Laterality: Right;  Right total shoulder arthroplasty   TOTAL SHOULDER  ARTHROPLASTY Left 01/22/2019   Procedure: TOTAL SHOULDER ARTHROPLASTY;  Surgeon: Dozier Soulier, MD;  Location: [facility];  Service: Orthopedics;  Laterality: Left;   VAGINAL DELIVERY  1976   Family History Family History  Problem Relation Age of Onset   Atrial fibrillation Mother    Heart disease Father    Diverticulitis Father    Diverticulitis Sister    Kidney disease Sister    Leukemia Brother    Colon cancer Paternal Grandfather    Breast cancer Neg Hx    BRCA 1/2 Neg Hx     Social History Social History[1] Allergies Lipitor [atorvastatin calcium ]  Review of Systems Review of Systems  All other systems reviewed and are negative.   Physical Exam Vital Signs  I have reviewed the triage vital signs BP (!) 142/66 (BP Location: Left Arm)   Pulse 87   Temp 98.1 F (36.7 C) (Oral)   Resp 16   Ht 5' 4.5 (1.638 m)   Wt 69.9 kg   SpO2 99%   BMI 26.03 kg/m  Physical Exam Vitals and nursing note reviewed.  Constitutional:      General: She is not in acute distress.    Appearance: She is well-developed.  HENT:     Head: Normocephalic and atraumatic.     Mouth/Throat:     Mouth: Mucous membranes are moist.  Eyes:     General: No visual field deficit.    Pupils: Pupils are equal, round, and reactive to light.  Cardiovascular:     Rate and Rhythm: Normal rate and regular rhythm.     Heart sounds: No murmur heard. Pulmonary:     Effort: Pulmonary effort is normal. No respiratory distress.     Breath sounds: Normal breath sounds.  Abdominal:     General: Abdomen is flat.     Palpations: Abdomen is soft.     Tenderness: There is no abdominal tenderness.  Musculoskeletal:        General: No tenderness.     Right lower leg: No edema.     Left lower leg: No edema.  Skin:    General: Skin is warm and dry.  Neurological:     General: No focal deficit present.     Mental Status: She is alert. Mental status is at baseline.     Comments: Cranial nerves II through XII  intact, strength 5 out of 5 in the bilateral upper and lower extremities, no sensory deficit to light touch, no dysmetria on finger-nose-finger testing, ambulatory with steady gait.   Psychiatric:        Mood and Affect: Mood normal.        Behavior: Behavior normal.  ED Results and Treatments Labs (all labs ordered are listed, but only abnormal results are displayed) Labs Reviewed  URINALYSIS, ROUTINE W REFLEX MICROSCOPIC - Abnormal; Notable for the following components:      Result Value   Color, Urine STRAW (*)    Hgb urine dipstick SMALL (*)    Leukocytes,Ua TRACE (*)    Bacteria, UA RARE (*)    All other components within normal limits  CBC WITH DIFFERENTIAL/PLATELET  BASIC METABOLIC PANEL WITH GFR  SEDIMENTATION RATE  C-REACTIVE PROTEIN  CBG MONITORING, ED                                                                                                                          Radiology CT Head Wo Contrast Result Date: 07/21/2024 EXAM: CT HEAD WITHOUT CONTRAST 07/21/2024 12:32:00 PM TECHNIQUE: CT of the head was performed without the administration of intravenous contrast. Automated exposure control, iterative reconstruction, and/or weight based adjustment of the mA/kV was utilized to reduce the radiation dose to as low as reasonably achievable. COMPARISON: None available. CLINICAL HISTORY: Headache, new onset (Age >= 51y). FINDINGS: BRAIN AND VENTRICLES: No acute hemorrhage. No evidence of acute infarct. Dilated perivascular space within the right basal ganglia. 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 No hydrocephalus. No extra-axial collection. No mass effect or midline shift. ORBITS: Bilateral lens replacement noted. SINUSES: No acute abnormality. SOFT TISSUES AND SKULL: No acute soft tissue abnormality. No skull fracture. VASCULATURE: Mild calcific atheromatous disease within the carotid siphons. IMPRESSION: 1. No acute intracranial abnormality related to the new  onset headache. 2. 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 Electronically signed by: Evalene Coho MD 07/21/2024 12:56 PM EST RP Workstation: [id number]    Pertinent labs & imaging results that were available during my care of the patient were reviewed by me and considered in my medical decision making (see MDM for details).  Medications Ordered in ED Medications  acetaminophen  (TYLENOL ) tablet 1,000 mg (1,000 mg Oral Given 07/21/24 1744)                                                                                                                                     Procedures Procedures  (including critical care time)  Medical Decision Making / ED Course   MDM:  78 year old presenting to the emergency department with headache, double vision.  Patient overall well-appearing, physical examination overall reassuring.  No apparent extraocular movement abnormality appreciable but history of reported double vision is concerning.  Differential includes stroke, temporal arteritis, tumor, migraine, tension headache.  Will obtain MRI as CT scan was negative for acute process.  Will repeat ESR and CRP testing.  Will reassess.  Patient only request Tylenol  for pain.  Clinical Course as of 07/21/24 1838  Tue Jul 21, 2024  1836 Unfortunately despite MRI being ordered over an hour ago MRI reports that they are not able to have MRI performed today at [facility].  Discussed with Dr. Michaela with neurology, he agrees that MRI is necessary and recommends transfer to [facility] for completion of workup.  Agrees with MRI brain and orbits.  Discussed with patient and her family member, they are agreeable to transfer, patient can go via private vehicle driven by family member.  IV will be secured.  Accepted by Dr. Goldston [WS]    Clinical Course User Index [WS] Francesca Elsie CROME, MD     Additional history obtained: -Additional history obtained from  family -External records from outside source obtained and reviewed including: Chart review including previous notes, labs, imaging, consultation notes including outpatient labs    Lab Tests: -I ordered, reviewed, and interpreted labs.   The pertinent results include:   Labs Reviewed  URINALYSIS, ROUTINE W REFLEX MICROSCOPIC - Abnormal; Notable for the following components:      Result Value   Color, Urine STRAW (*)    Hgb urine dipstick SMALL (*)    Leukocytes,Ua TRACE (*)    Bacteria, UA RARE (*)    All other components within normal limits  CBC WITH DIFFERENTIAL/PLATELET  BASIC METABOLIC PANEL WITH GFR  SEDIMENTATION RATE  C-REACTIVE PROTEIN  CBG MONITORING, ED    Notable for bacturia   Imaging Studies ordered: I ordered imaging studies including CT head On my interpretation imaging demonstrates no acute process I independently visualized and interpreted imaging. I agree with the radiologist interpretation   Medicines ordered and prescription drug management: Meds ordered this encounter  Medications   acetaminophen  (TYLENOL ) tablet 1,000 mg    -I have reviewed the patients home medicines and have made adjustments as needed    Reevaluation: After the interventions noted above, I reevaluated the patient and found that their symptoms have improved  Co morbidities that complicate the patient evaluation  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    Final Clinical Impression(s) / ED  Diagnoses Final diagnoses:  Diplopia  Acute nonintractable headache, unspecified headache type     This chart was dictated using voice recognition software.  Despite best efforts to proofread,  errors can occur which can change the documentation meaning.     [1]  Social History Tobacco Use   Smoking status: Never   Smokeless tobacco: Never  Vaping Use   Vaping status: Never Used  Substance Use Topics   Alcohol use: Yes  Alcohol/week: 8.0 - 10.0 standard drinks of alcohol    Types: 8 - 10 Glasses of wine per week    Comment: week    Drug use: No     Francesca Elsie CROME, MD 07/21/24 RONOLD

## 2024-07-21 NOTE — Telephone Encounter (Signed)
 Spoke with Patient due to symptoms of head pressure and double vision need to go to ED  Patient stated has not had double vision since yesterday, still with pressure  Verbalized understanding and will go to [facility] emergency department today

## 2024-07-21 NOTE — Discharge Instructions (Addendum)
 Please go to [facility] hospital emergency room to have your MRIs performed.

## 2024-07-21 NOTE — ED Triage Notes (Signed)
 Pt ambulatory to triage reporting a headache that initially began on the LEFT side but has moved to the entire head. Pt reports that she messaged her provider when she began to experience blurred vision. Pt was due to be seen today, but was sent to the ER.

## 2024-07-21 NOTE — Telephone Encounter (Signed)
 Copied from CRM #[id number]. Topic: Clinical - Red Word Triage >> Jul 20, 2024  2:07 PM Robinson H wrote: [facility] that prompted transfer to Nurse Triage: Still having pain in head that travels to other side really bad last night comes back at night time, blurry and double vision is new   Patient has an OV today  Edmond Ginsberg,RMA

## 2024-07-22 ENCOUNTER — Observation Stay (HOSPITAL_BASED_OUTPATIENT_CLINIC_OR_DEPARTMENT_OTHER)

## 2024-07-22 ENCOUNTER — Encounter (HOSPITAL_COMMUNITY): Payer: Self-pay | Admitting: Family Medicine

## 2024-07-22 ENCOUNTER — Observation Stay (HOSPITAL_COMMUNITY)

## 2024-07-22 DIAGNOSIS — G4489 Other headache syndrome: Secondary | ICD-10-CM | POA: Diagnosis not present

## 2024-07-22 DIAGNOSIS — H538 Other visual disturbances: Secondary | ICD-10-CM

## 2024-07-22 DIAGNOSIS — M316 Other giant cell arteritis: Secondary | ICD-10-CM

## 2024-07-22 DIAGNOSIS — H539 Unspecified visual disturbance: Secondary | ICD-10-CM

## 2024-07-22 DIAGNOSIS — I10 Essential (primary) hypertension: Secondary | ICD-10-CM

## 2024-07-22 DIAGNOSIS — R519 Headache, unspecified: Secondary | ICD-10-CM

## 2024-07-22 LAB — BASIC METABOLIC PANEL WITH GFR
Anion gap: 13 (ref 5–15)
BUN: 15 mg/dL (ref 8–23)
CO2: 25 mmol/L (ref 22–32)
Calcium: 9.7 mg/dL (ref 8.9–10.3)
Chloride: 98 mmol/L (ref 98–111)
Creatinine, Ser: 0.67 mg/dL (ref 0.44–1.00)
GFR, Estimated: >60 mL/min (ref >60)
Glucose, Bld: 117 mg/dL — ABNORMAL HIGH (ref 70–99)
Potassium: 3.5 mmol/L (ref 3.5–5.1)
Sodium: 136 mmol/L (ref 135–145)

## 2024-07-22 LAB — CBC
HCT: 35.8 % — ABNORMAL LOW (ref 36.0–46.0)
Hemoglobin: 12.3 g/dL (ref 12.0–15.0)
MCH: 31.9 pg (ref 26.0–34.0)
MCHC: 34.4 g/dL (ref 30.0–36.0)
MCV: 92.7 fL (ref 80.0–100.0)
Platelets: 243 K/uL (ref 150–400)
RBC: 3.86 MIL/uL — ABNORMAL LOW (ref 3.87–5.11)
RDW: 12.5 % (ref 11.5–15.5)
WBC: 9.4 K/uL (ref 4.0–10.5)
nRBC: 0 % (ref 0.0–0.2)

## 2024-07-22 MED ORDER — POLYETHYLENE GLYCOL 3350 17 G PO PACK
17.0000 g | PACK | Freq: Every day | ORAL | Status: DC | PRN
  Administered 2024-07-22: 21:00:00 17 g via ORAL
  Filled 2024-07-22: qty 1

## 2024-07-22 MED ORDER — SODIUM CHLORIDE 0.9% FLUSH
3.0000 mL | Freq: Two times a day (BID) | INTRAVENOUS | Status: DC
  Administered 2024-07-22 – 2024-07-23 (×3): 3 mL via INTRAVENOUS

## 2024-07-22 MED ORDER — TRIAMTERENE-HCTZ 75-50 MG PO TABS: 1.0000 | ORAL_TABLET | ORAL | Status: DC

## 2024-07-22 MED ORDER — TRIAMTERENE-HCTZ 37.5-25 MG PO TABS: 1.0000 | ORAL_TABLET | ORAL | Status: DC

## 2024-07-22 MED ORDER — PANTOPRAZOLE SODIUM 40 MG PO TBEC
40.0000 mg | DELAYED_RELEASE_TABLET | Freq: Every day | ORAL | Status: DC
  Administered 2024-07-22 – 2024-07-23 (×2): 40 mg via ORAL
  Filled 2024-07-22 (×2): qty 1

## 2024-07-22 MED ORDER — MELATONIN 3 MG PO TABS: 3.0000 mg | ORAL_TABLET | Freq: Every evening | ORAL | Status: DC | PRN

## 2024-07-22 MED ORDER — ACETAMINOPHEN 650 MG RE SUPP: 650.0000 mg | Freq: Four times a day (QID) | RECTAL | Status: DC | PRN

## 2024-07-22 MED ORDER — TRIAMTERENE-HCTZ 37.5-25 MG PO TABS
2.0000 | ORAL_TABLET | ORAL | Status: DC
  Administered 2024-07-23: 09:00:00 2 via ORAL
  Filled 2024-07-22: qty 2

## 2024-07-22 MED ORDER — PREDNISONE 20 MG PO TABS
50.0000 mg | ORAL_TABLET | Freq: Every day | ORAL | Status: DC
  Administered 2024-07-22 – 2024-07-23 (×2): 50 mg via ORAL
  Filled 2024-07-22 (×2): qty 3

## 2024-07-22 MED ORDER — ACETAMINOPHEN 325 MG PO TABS
650.0000 mg | ORAL_TABLET | Freq: Four times a day (QID) | ORAL | Status: DC | PRN
  Administered 2024-07-22 (×2): 650 mg via ORAL
  Filled 2024-07-22 (×2): qty 2

## 2024-07-22 MED ORDER — FENTANYL CITRATE (PF) 50 MCG/ML IJ SOSY: 12.5000 ug | PREFILLED_SYRINGE | INTRAMUSCULAR | Status: DC | PRN

## 2024-07-22 MED ORDER — OXYCODONE HCL 5 MG PO TABS: 2.5000 mg | ORAL_TABLET | ORAL | Status: DC | PRN

## 2024-07-22 MED ORDER — PROCHLORPERAZINE EDISYLATE 10 MG/2ML IJ SOLN: 5.0000 mg | Freq: Four times a day (QID) | INTRAMUSCULAR | Status: DC | PRN

## 2024-07-22 MED FILL — Losartan Potassium Tab 50 MG: 50.0000 mg | ORAL | Qty: 1 | Status: AC

## 2024-07-22 NOTE — Plan of Care (Signed)

## 2024-07-22 NOTE — Consult Note (Signed)
 NEUROLOGY CONSULT NOTE   Date of service: July 22, 2024 Patient Name: Ashley Dyer MRN:  [id number] DOB:  April 24, 1946 Chief Complaint: concern for GCA Requesting Provider: Gennaro Duwaine CROME, DO  History of Present Illness  Ashley Dyer is a 78 y.o. female with hx of nephrolithiasis, hyperlipidemia, hypertension who presents with about 1 week history of headaches on the left forehead/temple. It woke her up from sleep on 12/11. She also endorses blurry vision and diplopia intermittently on looking to the left.  No prior history of similar headaches.  She reports that the headache went to the right side of her head the next day and now has double vision intermittently on looking to the left and straight in front of her.  She was seen by her PCP.  She had CRP which was noted to be elevated.  She was sent to the ED where she has persistently elevated ESR and CRP.  She had further workup with MRI of the brain and orbits with and without contrast which was negative for any acute intracranial abnormalities.  Neurology was consulted for further evaluation and workup.  She denies fever, no jaw claudication, no muscle aches and pains.  She has tenderness to palpation on left temple as well as right temple along with more prominent vessels in the temple.    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   Walking pneumonia     Past Surgical History:  Procedure Laterality Date   CATARACT EXTRACTION Bilateral    EYE SURGERY     KIDNEY STONE SURGERY      NEPHROLITHOTOMY Right 10/22/2019   Procedure: RIGHT NEPHROLITHOTOMY PERCUTANEOUS WITH SURGEON ACCESS;  Surgeon: Cam Morene ORN, MD;  Location: [facility];  Service: Urology;  Laterality: Right;   TONSILLECTOMY     TOTAL HIP ARTHROPLASTY Left 05/11/2019   Procedure: LEFT TOTAL HIP ARTHROPLASTY ANTERIOR APPROACH;  Surgeon: Liam Lerner, MD;  Location: [facility];  Service: Orthopedics;  Laterality: Left;   TOTAL SHOULDER ARTHROPLASTY Right 06/13/2017   TOTAL SHOULDER ARTHROPLASTY Right 06/13/2017   Procedure: TOTAL SHOULDER ARTHROPLASTY;  Surgeon: Dozier Soulier, MD;  Location: [facility];  Service: Orthopedics;  Laterality: Right;  Right total shoulder arthroplasty   TOTAL SHOULDER ARTHROPLASTY Left 01/22/2019   Procedure: TOTAL SHOULDER ARTHROPLASTY;  Surgeon: Dozier Soulier, MD;  Location: [facility];  Service: Orthopedics;  Laterality: Left;   VAGINAL DELIVERY  1976    Family History: Family History  Problem Relation Age of Onset   Atrial fibrillation Mother    Heart disease Father    Diverticulitis Father    Diverticulitis Sister    Kidney disease Sister    Leukemia Brother    Colon cancer Paternal Grandfather    Breast cancer Neg Hx    BRCA 1/2 Neg Hx     Social History  reports that she has never smoked. She has never used smokeless tobacco. She reports current alcohol use of about 8.0 -  10.0 standard drinks of alcohol per week. She reports that she does not use drugs.  Allergies[1]  Medications  Current Medications[2]  Vitals   Vitals:   2024/08/17 1046 08-17-2024 1400 Aug 17, 2024 2222  BP: 134/66 (!) 142/66 (!) 148/62  Pulse: 84 87 73  Resp: 16 16 19   Temp: 98.4 F (36.9 C) 98.1 F (36.7 C) 97.6 F (36.4 C)  TempSrc: Oral Oral   SpO2: 100% 99% 97%  Weight: 69.9 kg    Height: 5' 4.5 (1.638 m)      Body mass index is 26.03 kg/m.   Physical Exam   General: Laying comfortably in bed; in no acute distress.  HENT: Normal oropharynx and mucosa. Normal external appearance of  ears and nose.  Temporal artery prominence noted along with tenderness to palpation of the arteries in the temple. Neck: Supple, no pain or tenderness  CV: No JVD. No peripheral edema.  Pulmonary: Symmetric Chest rise. Normal respiratory effort.  Abdomen: Soft to touch, non-tender.  Ext: No cyanosis, edema, or deformity  Skin: No rash. Normal palpation of skin.   Musculoskeletal: Normal digits and nails by inspection. No clubbing.   Neurologic Examination  Mental status/Cognition: Alert, oriented to self, place, month and year, good attention.  Speech/language: Fluent, comprehension intact, object naming intact, repetition intact.  Cranial nerves:   CN II Pupils equal and reactive to light, no VF deficits    CN III,IV,VI EOM intact, no gaze preference or deviation, no nystagmus    CN V normal sensation in V1, V2, and V3 segments bilaterally    CN VII no asymmetry, no nasolabial fold flattening    CN VIII normal hearing to speech    CN IX & X normal palatal elevation, no uvular deviation    CN XI 5/5 head turn and 5/5 shoulder shrug bilaterally    CN XII midline tongue protrusion    Motor:  Muscle bulk: Normal, tone normal, pronator drift none tremor none Mvmt Root Nerve  Muscle Right Left Comments  SA C5/6 Ax Deltoid 5 5   EF C5/6 Mc Biceps 5 5   EE C6/7/8 Rad Triceps 5 5   WF C6/7 Med FCR     WE C7/8 PIN ECU     F Ab C8/T1 U ADM/FDI 5 5   HF L1/2/3 Fem Illopsoas 5 5   KE L2/3/4 Fem Quad 5 5   DF L4/5 D Peron Tib Ant 5 5   PF S1/2 Tibial Grc/Sol 5 5    Sensation:  Light touch Intact throughout   Pin prick    Temperature    Vibration   Proprioception    Coordination/Complex Motor:  - Finger to Nose intact bilaterally - Heel to shin intact bilaterally - Rapid alternating movement are normal - Gait: Deferred for patient safety.  Labs/Imaging/Neurodiagnostic studies   CBC:  Recent Labs  Lab 08/17/24 1249  WBC 7.7  NEUTROABS 5.2  HGB 13.7  HCT 40.8  MCV 93.4   PLT 270   Basic Metabolic Panel:  Lab Results  Component Value Date   NA 138 08/17/24   K 3.6 08-17-24   CO2 26 08-17-24   GLUCOSE 91 August 17, 2024   BUN 16 17-Aug-2024   CREATININE 0.75 17-Aug-2024   CALCIUM  10.1 08-17-2024   GFRNONAA >60 2024/08/17   GFRAA 88 08/30/2020   Lipid Panel:  Lab Results  Component Value Date   LDLCALC 57 04/21/2024   HgbA1c:  Lab Results  Component Value Date   HGBA1C 6.2  04/21/2024   Urine Drug Screen: No results found for: LABOPIA, COCAINSCRNUR, LABBENZ, AMPHETMU, THCU, LABBARB  Alcohol Level No results found for: [facility] INR  Lab Results  Component Value Date   INR 1.0 05/04/2019   APTT  Lab Results  Component Value Date   APTT 34 05/04/2019   AED levels: No results found for: PHENYTOIN, ZONISAMIDE, LAMOTRIGINE, LEVETIRACETA  CT Head without contrast(Personally reviewed): CTH was negative for a large hypodensity concerning for a large territory infarct or hyperdensity concerning for an ICH  MR orbit with and without contrast(Personally reviewed): No acute abnormality  MRI Brain with and without contrast (Personally reviewed): No acute abnormalities.  ESR: 88.  CRP: 5.1.  ASSESSMENT   Ashley Dyer is a 78 y.o. female with hx of nephrolithiasis, hyperlipidemia, hypertension who presents with new onset headache involving bilateral temples over the last week along with prominence of the vessels in the temple bilaterally and tenderness to palpation of vessels in the temple.  No obvious signs of polymyalgia rheumatica.  She denies any jaw claudication.  No prior similar headache.  Labs with elevated ESR and CRP.  Imaging with MRI of the brain and orbits both with and without contrast with no acute abnormalities noted.  She currently denies any symptoms of vision abnormalities including any diplopia or blurred vision but has had these occur intermittently.  I do agree that her presentation is concerning for  temporal arteritis.  With the intermittent visual symptoms but no current deficits and no stroke on MRIs, will use PO prednisone . If she developed recurrence of diplopia or blurred vision, she will need high dose IV solumedrol.  RECOMMENDATIONS  - Prednisone  50 mg daily p.o. - PPI and vitamin D  while on steroids. - Vascular surgery consult for temporal artery biopsy. - if temporal arteritis is confirmed on biopsy, she will need a prolonged taper of steroids. - we will continue to follow along. ______________________________________________________________________  Plan discussed with patient, her daughter at the bedside. Plan discussed with Dr. Gennaro with the ED team.  Signed, Genova Kiner, MD [facility]     [1]  Allergies Allergen Reactions   Lipitor [Atorvastatin Calcium ]     Muscle aches  [2]  Current Facility-Administered Medications:    pantoprazole  (PROTONIX ) EC tablet 40 mg, 40 mg, Oral, Daily, Kwamaine Cuppett, MD   predniSONE  (DELTASONE ) tablet 50 mg, 50 mg, Oral, Q breakfast, Deren Degrazia, MD  Current Outpatient Medications:    Alirocumab  (PRALUENT ) 150 MG/ML SOAJ, INJECT 1 PEN INTO THE SKIN EVERY 14 DAYS, Disp: 2 mL, Rfl: 11   Coenzyme Q10 (COQ-10 PO), Take 1 capsule by mouth daily., Disp: , Rfl:    Cyanocobalamin (VITAMIN B12) 1000 MCG TBCR, daily., Disp: , Rfl:    ezetimibe  (ZETIA ) 10 MG tablet, Take 1 tablet by mouth once daily, Disp: 90 tablet, Rfl: 3   Ibuprofen-Acetaminophen  125-250 MG TABS, Take 2 tablets by mouth daily as needed (pain)., Disp: , Rfl:    losartan  (COZAAR ) 50 MG tablet, Take 1 tablet by mouth once daily, Disp: 90 tablet, Rfl: 3   tretinoin (RETIN-A) 0.025 % cream, Apply topically at bedtime. (Patient not taking: Reported on 07/16/2024), Disp: , Rfl:    triamterene -hydrochlorothiazide  (MAXZIDE ) 75-50 MG tablet, TAKE 1 TABLET BY MOUTH EVERY OTHER DAY AND ALTERNATE WITH 1/2 (ONE-HALF) TABLET THE OTHER DAY, Disp: 60 tablet,  Rfl: 3   Vitamin D -Vitamin K (VITAMIN K2-VITAMIN D3 PO), Take 1 capsule by mouth daily., Disp: , Rfl:

## 2024-07-22 NOTE — ED Provider Notes (Signed)
 [place] EMERGENCY DEPARTMENT AT [facility] Provider Note   CSN: [id number] Arrival date & time: 07/21/24  1040     Patient presents with: Headache and Blurred Vision   Ashley Dyer is a 78 y.o. female.   78 year old female presents for MRI.  She was sent from outside hospital.  Had a left-sided temporal headache that started on December 11 was later associated with some right-sided headache as well as double vision that comes and goes.  States she had it for pretty long time earlier today but is resolved now.  He was recommended by neurology at the other hospital that she come in for MRI.  She states her headache is overall improved at this time.   Headache Associated symptoms: no abdominal pain, no back pain, no cough, no ear pain, no eye pain, no fever, no seizures, no sore throat and no vomiting        Prior to Admission medications  Medication Sig Start Date End Date Taking? Authorizing Provider  Alirocumab  (PRALUENT ) 150 MG/ML SOAJ INJECT 1 PEN INTO THE SKIN EVERY 14 DAYS 10/08/23  Yes Nahser, Aleene PARAS, MD  Coenzyme Q10 (COQ-10) 100 MG CAPS Take 100 capsules by mouth in the morning.   Yes [provider]  Evening Primrose Oil 1300 MG CAPS Take 1,300 mg by mouth in the morning.   Yes [provider]  ezetimibe  (ZETIA ) 10 MG tablet Take 1 tablet by mouth once daily 10/14/23  Yes Swinyer, Rosaline HERO, NP  Ibuprofen-Acetaminophen  125-250 MG TABS Take 2 tablets by mouth every 4 (four) hours as needed (pain).   Yes [provider]  losartan  (COZAAR ) 50 MG tablet Take 1 tablet by mouth once daily 09/23/23  Yes Nahser, Aleene PARAS, MD  Magnesium Glycinate (MAGNESIUM GLYCINATE ADVANCED) 120 MG CAPS Take 120 mg by mouth in the morning.   Yes [provider]  triamterene -hydrochlorothiazide  (MAXZIDE ) 75-50 MG tablet TAKE 1 TABLET BY MOUTH EVERY OTHER DAY AND ALTERNATE WITH 1/2 (ONE-HALF) TABLET THE OTHER DAY 09/30/23  Yes Nahser, Aleene PARAS, MD   Vitamin D -Vitamin K (VITAMIN K2-VITAMIN D3) 90-125 MCG CAPS Take 1 capsule by mouth in the morning.   Yes [provider]  Zinc 30 MG CAPS Take 30 mg by mouth.   Yes [provider]    Allergies: Lipitor [atorvastatin calcium ]    Review of Systems  Constitutional:  Negative for chills and fever.  HENT:  Negative for ear pain and sore throat.   Eyes:  Positive for visual disturbance. Negative for pain.  Respiratory:  Negative for cough and shortness of breath.   Cardiovascular:  Negative for chest pain and palpitations.  Gastrointestinal:  Negative for abdominal pain and vomiting.  Genitourinary:  Negative for dysuria and hematuria.  Musculoskeletal:  Negative for arthralgias and back pain.  Skin:  Negative for color change and rash.  Neurological:  Positive for headaches. Negative for seizures and syncope.  All other systems reviewed and are negative.   Updated Vital Signs BP 112/85 (BP Location: Right Arm)   Pulse 78   Temp 97.6 F (36.4 C)   Resp 14   Ht 5' 4.5 (1.638 m)   Wt 69.9 kg   SpO2 95%   BMI 26.03 kg/m   Physical Exam Vitals and nursing note reviewed.  Constitutional:      General: She is not in acute distress.    Appearance: She is well-developed. She is not ill-appearing.  HENT:     Head:  Normocephalic and atraumatic.  Eyes:     General: No scleral icterus.    Extraocular Movements: Extraocular movements intact.     Conjunctiva/sclera: Conjunctivae normal.     Pupils: Pupils are equal, round, and reactive to light. Pupils are equal.  Cardiovascular:     Rate and Rhythm: Normal rate and regular rhythm.     Heart sounds: No murmur heard. Pulmonary:     Effort: Pulmonary effort is normal. No respiratory distress.     Breath sounds: Normal breath sounds.  Abdominal:     Palpations: Abdomen is soft.     Tenderness: There is no abdominal tenderness.  Musculoskeletal:        General: No swelling.     Cervical back: Neck supple.   Skin:    General: Skin is warm and dry.     Capillary Refill: Capillary refill takes less than 2 seconds.  Neurological:     Mental Status: She is alert. Mental status is at baseline.     Comments: Visual fields intact bilaterally  Psychiatric:        Mood and Affect: Mood normal.     (all labs ordered are listed, but only abnormal results are displayed) Labs Reviewed  URINALYSIS, ROUTINE W REFLEX MICROSCOPIC - Abnormal; Notable for the following components:      Result Value   Color, Urine STRAW (*)    Hgb urine dipstick SMALL (*)    Leukocytes,Ua TRACE (*)    Bacteria, UA RARE (*)    All other components within normal limits  SEDIMENTATION RATE - Abnormal; Notable for the following components:   Sed Rate 88 (*)    All other components within normal limits  C-REACTIVE PROTEIN - Abnormal; Notable for the following components:   CRP 5.1 (*)    All other components within normal limits  BASIC METABOLIC PANEL WITH GFR - Abnormal; Notable for the following components:   Glucose, Bld 117 (*)    All other components within normal limits  CBC - Abnormal; Notable for the following components:   RBC 3.86 (*)    HCT 35.8 (*)    All other components within normal limits  CBC WITH DIFFERENTIAL/PLATELET  BASIC METABOLIC PANEL WITH GFR    EKG: None  Radiology: MR ORBITS W WO CONTRAST Result Date: 07/22/2024 EXAM: MRI ORBITS WITHOUT AND WITH CONTRAST 07/21/2024 09:55:13 PM TECHNIQUE: Multiplanar, multisequence MRI of the orbits was performed without and with intravenous contrast. 7 mL gadobutrol  (GADAVIST ) 1 MMOL/ML injection. COMPARISON: Comparison made with concomitant MRI of the brain performed at the same time. CLINICAL HISTORY: Diplopia FINDINGS: ORBITS: Normal globes. Prior bilateral ocular lens replacement. Symmetric caliber and signal of the optic nerves. Symmetric extraocular muscles. No orbital mass. No abnormal enhancement. SINUSES AND MASTOIDS: Clear. BRAIN: No acute  abnormality. BONES AND SOFT TISSUES: Normal bone marrow signal. No acute soft tissue abnormality. IMPRESSION: 1. Negative MRI of the orbits. No acute abnormality . Electronically signed by: Morene Hoard MD 07/22/2024 12:15 AM EST RP Workstation: [id number]   MR Brain W and Wo Contrast Result Date: 07/22/2024 EXAM: MRI BRAIN WITH AND WITHOUT CONTRAST 07/21/2024 09:55:13 PM TECHNIQUE: Multiplanar multisequence MRI of the head/brain was performed with and without the administration of 7 mL of gadobutrol  (GADAVIST ) 1 MMOL/ML intravenous contrast. COMPARISON: Comparison made with head CT from earlier the same day. CLINICAL HISTORY: Headache, new onset (Age >= 51y) FINDINGS: BRAIN AND VENTRICLES: No acute infarct. No acute intracranial hemorrhage. No mass effect or midline shift. No hydrocephalus.  The sella is unremarkable. Normal flow voids. No mass or abnormal enhancement. A few subcentimeter foci of T2/FLAIR hyperintensity are seen involving the supratentorial cerebral white matter and pons, likely related to chronic microvascular ischemic disease, minimal in nature and less than is typically seen for age. ORBITS: No acute abnormality. Prior bilateral ocular lens replacement. SINUSES: No acute abnormality. BONES AND SOFT TISSUES: Normal bone marrow signal and enhancement. No acute soft tissue abnormality. IMPRESSION: 1. Normal  brain MRI for age. No acute intracranial abnormality. Electronically signed by: Morene Hoard MD 07/22/2024 12:09 AM EST RP Workstation: [id number]   CT Head Wo Contrast Result Date: 07/21/2024 EXAM: CT HEAD WITHOUT CONTRAST 07/21/2024 12:32:00 PM TECHNIQUE: CT of the head was performed without the administration of intravenous contrast. Automated exposure control, iterative reconstruction, and/or weight based adjustment of the mA/kV was utilized to reduce the radiation dose to as low as reasonably achievable. COMPARISON: None available. CLINICAL HISTORY: Headache, new onset  (Age >= 51y). FINDINGS: BRAIN AND VENTRICLES: No acute hemorrhage. No evidence of acute infarct. Dilated perivascular space within the right basal ganglia. 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 No hydrocephalus. No extra-axial collection. No mass effect or midline shift. ORBITS: Bilateral lens replacement noted. SINUSES: No acute abnormality. SOFT TISSUES AND SKULL: No acute soft tissue abnormality. No skull fracture. VASCULATURE: Mild calcific atheromatous disease within the carotid siphons. IMPRESSION: 1. No acute intracranial abnormality related to the new onset headache. 2. 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 Electronically signed by: Evalene Coho MD 07/21/2024 12:56 PM EST RP Workstation: [id number]     Procedures   Medications Ordered in the ED  predniSONE  (DELTASONE ) tablet 50 mg (50 mg Oral Given 07/22/24 0117)  pantoprazole  (PROTONIX ) EC tablet 40 mg (40 mg Oral Given 07/22/24 0117)  losartan  (COZAAR ) tablet 50 mg (has no administration in time range)  sodium chloride  flush (NS) 0.9 % injection 3 mL (3 mLs Intravenous Given 07/22/24 0405)  acetaminophen  (TYLENOL ) tablet 650 mg (650 mg Oral Given 07/22/24 0518)    Or  acetaminophen  (TYLENOL ) suppository 650 mg ( Rectal See Alternative 07/22/24 0518)  oxyCODONE  (Oxy IR/ROXICODONE ) immediate release tablet 2.5-5 mg (has no administration in time range)  fentaNYL  (SUBLIMAZE ) injection 12.5-50 mcg (has no administration in time range)  polyethylene glycol (MIRALAX  / GLYCOLAX ) packet 17 g (has no administration in time range)  prochlorperazine  (COMPAZINE ) injection 5 mg (has no administration in time range)  melatonin tablet 3 mg (has no administration in time range)  triamterene -hydrochlorothiazide  (MAXZIDE -25) 37.5-25 MG per tablet 1 tablet (has no administration in time range)    And  triamterene -hydrochlorothiazide  (MAXZIDE -25) 37.5-25 MG per tablet 2 tablet (has no administration in time range)   acetaminophen  (TYLENOL ) tablet 1,000 mg (1,000 mg Oral Given 07/21/24 1744)  gadobutrol  (GADAVIST ) 1 MMOL/ML injection 7 mL (7 mLs Intravenous Contrast Given 07/21/24 2155)    Clinical Course as of 07/22/24 0650  Tue Jul 21, 2024  1836 Unfortunately despite MRI being ordered over an hour ago MRI reports that they are not able to have MRI performed today at [facility].  Discussed with Dr. Michaela with neurology, he agrees that MRI is necessary and recommends transfer to [facility] for completion of workup.  Agrees with MRI brain and orbits.  Discussed with patient and her family member, they are agreeable to transfer, patient can go via private vehicle driven by family member.  IV will be secured.  Accepted by Dr. Goldston [WS]    Clinical Course User  Index [WS] Francesca Elsie CROME, MD                                 Medical Decision Making Patient sent here from outside ER for MRI.  MRIs are negative.  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  Neurology was consulted and they will start steroids.  Recommended hospitalist admission and future consultation with vascular surgery for temporal artery biopsy.  I discussed patient's case with hospitalist and patient will be admitted for further workup and management.  All results and plan discussed with patient and family at bedside and they are agreeable with plan for admission.  Problems Addressed: Acute nonintractable headache, unspecified headache type: undiagnosed new problem with uncertain prognosis Diplopia: undiagnosed new problem with uncertain prognosis  Amount and/or Complexity of Data Reviewed External Data Reviewed: notes.    Details: Prior ED records reviewed and patient was sent here for MRI Labs: ordered. Decision-making details documented in ED Course.    Details:  reviewed by me and ESR and CRP are elevated Radiology: ordered and independent interpretation  performed. Decision-making details documented in ED Course.    Details: MRI brain and orbits show no acute abnormality  Risk OTC drugs. Prescription drug management. Drug therapy requiring intensive monitoring for toxicity. Decision regarding hospitalization.     Final diagnoses:  Diplopia  Acute nonintractable headache, unspecified headache type    ED Discharge Orders     None          Gennaro Duwaine CROME, DO 07/22/24 [phone]

## 2024-07-22 NOTE — ED Notes (Signed)
 Neurology at bedside.

## 2024-07-22 NOTE — Care Management Obs Status (Signed)
 MEDICARE OBSERVATION STATUS NOTIFICATION   Patient Details  Name: Ashley Dyer MRN: [id number] Date of Birth: April 05, 1946   Medicare Observation Status Notification Given:  Yes  Verbally reviewed observation notice with Tedi Millin telephonically at [phone].    Chyann Ambrocio 07/22/2024, 3:23 PM

## 2024-07-22 NOTE — Progress Notes (Signed)
 Temporal artery duplex has been completed.   Results can be found under chart review under CV PROC. 07/22/2024 3:08 PM Lyle Leisner RVT, RDMS

## 2024-07-22 NOTE — H&P (Signed)
 History and Physical    Ashley Dyer FMW:[id number] DOB: 06/09/1946 DOA: 07/21/2024  PCP: Kennyth Worth HERO, MD   Patient coming from: Home   Chief Complaint: Headache, vision change   HPI: Ashley Dyer is a 78 y.o. female with medical history significant for hypertension, hyperlipidemia, and osteoarthritis who presents with headache and vision change.  Patient developed a left temporal headache that woke her from sleep on 07/16/2024.  She has also developed intermittent blurred vision and diplopia.  She has never experienced this type of headache previously.  She was evaluated in an outpatient clinic for this and had normal CRP and ESR of 53 on 07/17/2024.  With the ongoing headache and vision change, she was advised to seek evaluation in the ED.  She reports that the left temple is tender to the touch but she denies any fevers, chills, or jaw pain.  ED Course: Upon arrival to the ED, patient is found to be afebrile and saturating well on room air with normal RR, normal HR, and stable BP.  Labs are most notable for normal BMP, normal CBC, CRP 5.1, and ESR 88.  MRI brain was a normal study and MRI orbits is negative for acute findings.  She was evaluated by neurology in the ED and treated with prednisone .  Review of Systems:  All other systems reviewed and apart from HPI, are negative.  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   Walking pneumonia     Past Surgical History:  Procedure Laterality Date   CATARACT EXTRACTION Bilateral    EYE SURGERY     KIDNEY STONE SURGERY     NEPHROLITHOTOMY Right 10/22/2019   Procedure: RIGHT  NEPHROLITHOTOMY PERCUTANEOUS WITH SURGEON ACCESS;  Surgeon: Cam Morene ORN, MD;  Location: [facility];  Service: Urology;  Laterality: Right;   TONSILLECTOMY     TOTAL HIP ARTHROPLASTY Left 05/11/2019   Procedure: LEFT TOTAL HIP ARTHROPLASTY ANTERIOR APPROACH;  Surgeon: Liam Lerner, MD;  Location: [facility];  Service: Orthopedics;  Laterality: Left;   TOTAL SHOULDER ARTHROPLASTY Right 06/13/2017   TOTAL SHOULDER ARTHROPLASTY Right 06/13/2017   Procedure: TOTAL SHOULDER ARTHROPLASTY;  Surgeon: Dozier Soulier, MD;  Location: [facility];  Service: Orthopedics;  Laterality: Right;  Right total shoulder arthroplasty   TOTAL SHOULDER ARTHROPLASTY Left 01/22/2019   Procedure: TOTAL SHOULDER ARTHROPLASTY;  Surgeon: Dozier Soulier, MD;  Location: [facility];  Service: Orthopedics;  Laterality: Left;   VAGINAL DELIVERY  1976    Social History:   reports that she has never smoked. She has never used smokeless tobacco. She reports current alcohol use of about 8.0 - 10.0 standard drinks of alcohol per week. She reports that she does not use drugs.  Allergies[1]  Family History  Problem Relation Age of Onset   Atrial fibrillation Mother    Heart disease Father    Diverticulitis Father    Diverticulitis Sister    Kidney disease Sister    Leukemia Brother    Colon cancer Paternal Grandfather    Breast cancer Neg Hx  BRCA 1/2 Neg Hx      Prior to Admission medications  Medication Sig Start Date End Date Taking? Authorizing Provider  Alirocumab  (PRALUENT ) 150 MG/ML SOAJ INJECT 1 PEN INTO THE SKIN EVERY 14 DAYS 10/08/23   Nahser, Aleene PARAS, MD  Coenzyme Q10 (COQ-10 PO) Take 1 capsule by mouth daily.    [provider]  Cyanocobalamin (VITAMIN B12) 1000 MCG TBCR daily.    [provider]  ezetimibe  (ZETIA ) 10 MG tablet Take 1 tablet by mouth once daily 10/14/23   Swinyer, Rosaline HERO, NP  Ibuprofen-Acetaminophen  125-250 MG TABS Take 2 tablets by mouth daily as needed (pain).    [provider]  losartan  (COZAAR ) 50 MG tablet Take 1 tablet by mouth once daily 09/23/23   Nahser, Aleene PARAS, MD  tretinoin (RETIN-A) 0.025 % cream Apply topically at bedtime. Patient not taking: Reported on 07/16/2024 03/26/23   [provider]  triamterene -hydrochlorothiazide  (MAXZIDE ) 75-50 MG tablet TAKE 1 TABLET BY MOUTH EVERY OTHER DAY AND ALTERNATE WITH 1/2 (ONE-HALF) TABLET THE OTHER DAY 09/30/23   Nahser, Aleene PARAS, MD  Vitamin D -Vitamin K (VITAMIN K2-VITAMIN D3 PO) Take 1 capsule by mouth daily.    [provider]    Physical Exam: Vitals:   07/21/24 1046 07/21/24 1400 07/21/24 2222 07/22/24 0216  BP: 134/66 (!) 142/66 (!) 148/62 112/85  Pulse: 84 87 73 78  Resp: 16 16 19 14   Temp: 98.4 F (36.9 C) 98.1 F (36.7 C) 97.6 F (36.4 C)   TempSrc: Oral Oral    SpO2: 100% 99% 97% 95%  Weight: 69.9 kg     Height: 5' 4.5 (1.638 m)       Constitutional: NAD, calm  Eyes: PERTLA, lids and conjunctivae normal ENMT: Mucous membranes are moist. Posterior pharynx clear of any exudate or lesions.   Neck: supple, no masses  Respiratory: no wheezing, no crackles. No accessory muscle use.  Cardiovascular: S1 & S2 heard, regular rate and rhythm. No extremity edema.   Abdomen: No tenderness, soft. Bowel sounds active.  Musculoskeletal: no clubbing / cyanosis. No joint deformity upper and lower extremities.  Skin: no significant rashes, lesions, ulcers. Warm, dry, well-perfused. Neurologic: CN 2-12 grossly intact. Moving all extremities. Alert and oriented.  Psychiatric: Pleasant. Cooperative.    Labs and Imaging on Admission: I have personally reviewed following labs and imaging studies  CBC: Recent Labs  Lab 07/21/24 1249  WBC 7.7  NEUTROABS 5.2  HGB 13.7  HCT 40.8  MCV 93.4  PLT 270   Basic Metabolic Panel: Recent Labs  Lab 07/21/24 1249  NA 138  K 3.6  CL 99  CO2 26  GLUCOSE 91  BUN 16  CREATININE 0.75  CALCIUM  10.1   GFR: Estimated Creatinine  Clearance: 56.3 mL/min (by C-G formula based on SCr of 0.75 mg/dL). Liver Function Tests: No results for input(s): AST, ALT, ALKPHOS, BILITOT, PROT, ALBUMIN in the last 168 hours. No results for input(s): LIPASE, AMYLASE in the last 168 hours. No results for input(s): AMMONIA in the last 168 hours. Coagulation Profile: No results for input(s): INR, PROTIME in the last 168 hours. Cardiac Enzymes: No results for input(s): CKTOTAL, CKMB, CKMBINDEX, TROPONINI in the last 168 hours. BNP (last 3 results) No results for input(s): PROBNP in the last 8760 hours. HbA1C: No results for input(s): HGBA1C in the last 72 hours. CBG: No results for input(s): GLUCAP in the last 168 hours. Lipid Profile: No results for input(s): CHOL, HDL, LDLCALC, TRIG, CHOLHDL,  LDLDIRECT in the last 72 hours. Thyroid  Function Tests: No results for input(s): TSH, T4TOTAL, FREET4, T3FREE, THYROIDAB in the last 72 hours. Anemia Panel: No results for input(s): VITAMINB12, FOLATE, FERRITIN, TIBC, IRON, RETICCTPCT in the last 72 hours. Urine analysis:    Component Value Date/Time   COLORURINE STRAW (A) 07/21/2024 1456   APPEARANCEUR CLEAR 07/21/2024 1456   LABSPEC 1.008 07/21/2024 1456   PHURINE 7.0 07/21/2024 1456   GLUCOSEU NEGATIVE 07/21/2024 1456   HGBUR SMALL (A) 07/21/2024 1456   BILIRUBINUR NEGATIVE 07/21/2024 1456   BILIRUBINUR negative 03/23/2022 1519   KETONESUR NEGATIVE 07/21/2024 1456   PROTEINUR NEGATIVE 07/21/2024 1456   UROBILINOGEN 0.2 03/23/2022 1519   NITRITE NEGATIVE 07/21/2024 1456   LEUKOCYTESUR TRACE (A) 07/21/2024 1456   Sepsis Labs: @LABRCNTIP (procalcitonin:4,lacticidven:4) )No results found for this or any previous visit (from the past 240 hours).   Radiological Exams on Admission: MR ORBITS W WO CONTRAST Result Date: 07/22/2024 EXAM: MRI ORBITS WITHOUT AND WITH CONTRAST 07/21/2024 09:55:13 PM TECHNIQUE: Multiplanar,  multisequence MRI of the orbits was performed without and with intravenous contrast. 7 mL gadobutrol  (GADAVIST ) 1 MMOL/ML injection. COMPARISON: Comparison made with concomitant MRI of the brain performed at the same time. CLINICAL HISTORY: Diplopia FINDINGS: ORBITS: Normal globes. Prior bilateral ocular lens replacement. Symmetric caliber and signal of the optic nerves. Symmetric extraocular muscles. No orbital mass. No abnormal enhancement. SINUSES AND MASTOIDS: Clear. BRAIN: No acute abnormality. BONES AND SOFT TISSUES: Normal bone marrow signal. No acute soft tissue abnormality. IMPRESSION: 1. Negative MRI of the orbits. No acute abnormality . Electronically signed by: Morene Hoard MD 07/22/2024 12:15 AM EST RP Workstation: [id number]   MR Brain W and Wo Contrast Result Date: 07/22/2024 EXAM: MRI BRAIN WITH AND WITHOUT CONTRAST 07/21/2024 09:55:13 PM TECHNIQUE: Multiplanar multisequence MRI of the head/brain was performed with and without the administration of 7 mL of gadobutrol  (GADAVIST ) 1 MMOL/ML intravenous contrast. COMPARISON: Comparison made with head CT from earlier the same day. CLINICAL HISTORY: Headache, new onset (Age >= 51y) FINDINGS: BRAIN AND VENTRICLES: No acute infarct. No acute intracranial hemorrhage. No mass effect or midline shift. No hydrocephalus. The sella is unremarkable. Normal flow voids. No mass or abnormal enhancement. A few subcentimeter foci of T2/FLAIR hyperintensity are seen involving the supratentorial cerebral white matter and pons, likely related to chronic microvascular ischemic disease, minimal in nature and less than is typically seen for age. ORBITS: No acute abnormality. Prior bilateral ocular lens replacement. SINUSES: No acute abnormality. BONES AND SOFT TISSUES: Normal bone marrow signal and enhancement. No acute soft tissue abnormality. IMPRESSION: 1. Normal  brain MRI for age. No acute intracranial abnormality. Electronically signed by: Morene Hoard  MD 07/22/2024 12:09 AM EST RP Workstation: [id number]   CT Head Wo Contrast Result Date: 07/21/2024 EXAM: CT HEAD WITHOUT CONTRAST 07/21/2024 12:32:00 PM TECHNIQUE: CT of the head was performed without the administration of intravenous contrast. Automated exposure control, iterative reconstruction, and/or weight based adjustment of the mA/kV was utilized to reduce the radiation dose to as low as reasonably achievable. COMPARISON: None available. CLINICAL HISTORY: Headache, new onset (Age >= 51y). FINDINGS: BRAIN AND VENTRICLES: No acute hemorrhage. No evidence of acute infarct. Dilated perivascular space within the right basal ganglia. 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 No hydrocephalus. No extra-axial collection. No mass effect or midline shift. ORBITS: Bilateral lens replacement noted. SINUSES: No acute abnormality. SOFT TISSUES AND SKULL: No acute soft tissue abnormality. No skull fracture. VASCULATURE: Mild calcific atheromatous disease within the carotid siphons.  IMPRESSION: 1. No acute intracranial abnormality related to the new onset headache. 2. 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 Electronically signed by: Evalene Coho MD 07/21/2024 12:56 PM EST RP Workstation: [id number]     Assessment/Plan   1. Headache and vision disturbance; ?GCA  - Presents with headache, tender left temple, and intermittent vision abnormalities  - No explanatory findings on MRI brain or MRI orbits; ESR is elevated  - Appreciate neurology consultation, she has been started on prednisone  and will need vascular surgery consult in AM for consideration of temporal artery biopsy    2. Hypertension  - Losartan , triamterene , hydrochlorothiazide     3. HLD  - Does not tolerate statins, managed with Praluent  and ezetimibe      DVT prophylaxis: SCDs  Code Status: Full  Level of Care: Level of care: Telemetry Family Communication: Daughter at bedside  Disposition Plan:  Patient is from:  Home  Anticipated d/c is to: Home  Anticipated d/c date is: 12/18 or 07/24/24  Patient currently: Pending vascular surgery consultation  Consults called: Neurology  Admission status: Observation     Evalene GORMAN Sprinkles, MD [facility]  07/22/2024, 3:01 AM       [1]  Allergies Allergen Reactions   Lipitor [Atorvastatin Calcium ]     Muscle aches

## 2024-07-22 NOTE — Progress Notes (Addendum)
 PROGRESS NOTE    Ashley Dyer  FMW:[id number] DOB: 1945/12/31 DOA: 07/21/2024 PCP: Kennyth Worth HERO, MD  Subjective: Patient reports feeling okay overall, still has the headaches but tolerable. Denied any further blurry vision     Hospital Course: 78 year old female with PMH of HTN, HLD, osteoarthritis who presented to the ED with headaches and vision change.  Vision changes have been intermittent over the past 7 days, was evaluated in the outpatient clinic and eventually came to the ED.  Her ESR and CRP were elevated, MRI brain and MRI orbits without any acute findings.  Neurology was consulted for concern of GCA   Assessment and Plan:  Headaches and vision disturbances - concern for GCA with headache, tender left temple, and intermittent vision abnormalities.  Does have elevated ESR at 88, mildly elevated CRP.  MRI brain and MRI orbits with no acute findings - neurology consulted and appreciate recommendations - continue prednisone  50 mg - continue PPI while on steroids, start VitD on discharge  - vascular surgery consulted, will f/u recommendations    HTN - continue losartan  and triamterene -HCTZ  HLD - doesn't tolerate statins, on Praluent  and ezetimibe , resume on discharge    DVT prophylaxis: SCDs Start: 07/22/24 0300    Code Status: Full Code Disposition Plan: Home Reason for continuing need for hospitalization: Pending vascular surgery consultation for temporal artery biopsy  Objective: Vitals:   07/21/24 1400 07/21/24 2222 07/22/24 0216 07/22/24 0812  BP: (!) 142/66 (!) 148/62 112/85 139/72  Pulse: 87 73 78 90  Resp: 16 19 14 16   Temp: 98.1 F (36.7 C) 97.6 F (36.4 C)  98.1 F (36.7 C)  TempSrc: Oral   Oral  SpO2: 99% 97% 95% 96%  Weight:      Height:       No intake or output data in the 24 hours ending 07/22/24 1200 Filed Weights   07/21/24 1046  Weight: 69.9 kg    Examination:  Physical Exam Vitals and nursing note reviewed.  Constitutional:       General: She is not in acute distress. HENT:     Head:     Comments: Mild left temporal tenderness (per patient improved) Cardiovascular:     Rate and Rhythm: Normal rate.  Pulmonary:     Effort: No respiratory distress.     Breath sounds: No wheezing.  Abdominal:     General: There is no distension.     Tenderness: There is no abdominal tenderness.  Musculoskeletal:     Right lower leg: No edema.     Left lower leg: No edema.     Data Reviewed: I have personally reviewed following labs and imaging studies  CBC: Recent Labs  Lab 07/21/24 1249 07/22/24 0400  WBC 7.7 9.4  NEUTROABS 5.2  --   HGB 13.7 12.3  HCT 40.8 35.8*  MCV 93.4 92.7  PLT 270 243   Basic Metabolic Panel: Recent Labs  Lab 07/21/24 1249 07/22/24 0400  NA 138 136  K 3.6 3.5  CL 99 98  CO2 26 25  GLUCOSE 91 117*  BUN 16 15  CREATININE 0.75 0.67  CALCIUM  10.1 9.7   GFR: Estimated Creatinine Clearance: 56.3 mL/min (by C-G formula based on SCr of 0.67 mg/dL). Liver Function Tests: No results for input(s): AST, ALT, ALKPHOS, BILITOT, PROT, ALBUMIN in the last 168 hours. No results for input(s): LIPASE, AMYLASE in the last 168 hours. No results for input(s): AMMONIA in the last 168 hours. Coagulation Profile: No  results for input(s): INR, PROTIME in the last 168 hours. Cardiac Enzymes: No results for input(s): CKTOTAL, CKMB, CKMBINDEX, TROPONINI in the last 168 hours. ProBNP, BNP (last 5 results) No results for input(s): PROBNP, BNP in the last 8760 hours. HbA1C: No results for input(s): HGBA1C in the last 72 hours. CBG: No results for input(s): GLUCAP in the last 168 hours. Lipid Profile: No results for input(s): CHOL, HDL, LDLCALC, TRIG, CHOLHDL, LDLDIRECT in the last 72 hours. Thyroid  Function Tests: No results for input(s): TSH, T4TOTAL, FREET4, T3FREE, THYROIDAB in the last 72 hours. Anemia Panel: No results for input(s):  VITAMINB12, FOLATE, FERRITIN, TIBC, IRON, RETICCTPCT in the last 72 hours. Sepsis Labs: No results for input(s): PROCALCITON, LATICACIDVEN in the last 168 hours.  No results found for this or any previous visit (from the past 240 hours).   Radiology Studies: MR ORBITS W WO CONTRAST Result Date: 07/22/2024 EXAM: MRI ORBITS WITHOUT AND WITH CONTRAST 07/21/2024 09:55:13 PM TECHNIQUE: Multiplanar, multisequence MRI of the orbits was performed without and with intravenous contrast. 7 mL gadobutrol  (GADAVIST ) 1 MMOL/ML injection. COMPARISON: Comparison made with concomitant MRI of the brain performed at the same time. CLINICAL HISTORY: Diplopia FINDINGS: ORBITS: Normal globes. Prior bilateral ocular lens replacement. Symmetric caliber and signal of the optic nerves. Symmetric extraocular muscles. No orbital mass. No abnormal enhancement. SINUSES AND MASTOIDS: Clear. BRAIN: No acute abnormality. BONES AND SOFT TISSUES: Normal bone marrow signal. No acute soft tissue abnormality. IMPRESSION: 1. Negative MRI of the orbits. No acute abnormality . Electronically signed by: Morene Hoard MD 07/22/2024 12:15 AM EST RP Workstation: [id number]   MR Brain W and Wo Contrast Result Date: 07/22/2024 EXAM: MRI BRAIN WITH AND WITHOUT CONTRAST 07/21/2024 09:55:13 PM TECHNIQUE: Multiplanar multisequence MRI of the head/brain was performed with and without the administration of 7 mL of gadobutrol  (GADAVIST ) 1 MMOL/ML intravenous contrast. COMPARISON: Comparison made with head CT from earlier the same day. CLINICAL HISTORY: Headache, new onset (Age >= 51y) FINDINGS: BRAIN AND VENTRICLES: No acute infarct. No acute intracranial hemorrhage. No mass effect or midline shift. No hydrocephalus. The sella is unremarkable. Normal flow voids. No mass or abnormal enhancement. A few subcentimeter foci of T2/FLAIR hyperintensity are seen involving the supratentorial cerebral white matter and pons, likely related to  chronic microvascular ischemic disease, minimal in nature and less than is typically seen for age. ORBITS: No acute abnormality. Prior bilateral ocular lens replacement. SINUSES: No acute abnormality. BONES AND SOFT TISSUES: Normal bone marrow signal and enhancement. No acute soft tissue abnormality. IMPRESSION: 1. Normal  brain MRI for age. No acute intracranial abnormality. Electronically signed by: Morene Hoard MD 07/22/2024 12:09 AM EST RP Workstation: [id number]   CT Head Wo Contrast Result Date: 07/21/2024 EXAM: CT HEAD WITHOUT CONTRAST 07/21/2024 12:32:00 PM TECHNIQUE: CT of the head was performed without the administration of intravenous contrast. Automated exposure control, iterative reconstruction, and/or weight based adjustment of the mA/kV was utilized to reduce the radiation dose to as low as reasonably achievable. COMPARISON: None available. CLINICAL HISTORY: Headache, new onset (Age >= 51y). FINDINGS: BRAIN AND VENTRICLES: No acute hemorrhage. No evidence of acute infarct. Dilated perivascular space within the right basal ganglia. 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 No hydrocephalus. No extra-axial collection. No mass effect or midline shift. ORBITS: Bilateral lens replacement noted. SINUSES: No acute abnormality. SOFT TISSUES AND SKULL: No acute soft tissue abnormality. No skull fracture. VASCULATURE: Mild calcific atheromatous disease within the carotid siphons. IMPRESSION: 1. No acute intracranial abnormality related to  the new onset headache. 2. 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 Electronically signed by: Evalene Coho MD 07/21/2024 12:56 PM EST RP Workstation: [id number]    Scheduled Meds:  losartan   50 mg Oral Daily   pantoprazole   40 mg Oral Daily   predniSONE   50 mg Oral Q breakfast   sodium chloride  flush  3 mL Intravenous Q12H   triamterene -hydrochlorothiazide   1 tablet Oral Q48H   And   [START ON 07/23/2024]  triamterene -hydrochlorothiazide   2 tablet Oral Q48H   Continuous Infusions:   LOS: 0 days   Time spent: 36 minutes  Casimer Dare, MD  [facility]  07/22/2024, 12:00 PM  ?>:

## 2024-07-22 NOTE — Consult Note (Addendum)
 Hospital Consult    Reason for Consult: Evaluation for temporal artery biopsy Requesting Physician: [facility] MRN #:  [id number]  History of Present Illness: This is a 78 y.o. female who presented to the emergency department with left-sided temporal headache and double vision involving the left eye.  She is being seen in consultation for evaluation for temporal artery biopsy.  She has been started on prednisone  50 mg daily.  She believes her vision changes have improved however she is still having floaters.  Fortunately her headache has also nearly resolved.  Workup included elevated ESR and CRP.  She does not take any blood thinners.  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   Walking pneumonia     Past Surgical History:  Procedure Laterality Date   CATARACT EXTRACTION Bilateral    EYE SURGERY     KIDNEY STONE SURGERY     NEPHROLITHOTOMY Right 10/22/2019   Procedure: RIGHT NEPHROLITHOTOMY PERCUTANEOUS WITH SURGEON ACCESS;  Surgeon: Cam Morene ORN, MD;  Location: [facility];  Service: Urology;  Laterality: Right;   TONSILLECTOMY     TOTAL HIP ARTHROPLASTY Left 05/11/2019   Procedure: LEFT TOTAL HIP ARTHROPLASTY ANTERIOR APPROACH;  Surgeon: Liam Lerner, MD;  Location: [facility];  Service: Orthopedics;  Laterality: Left;   TOTAL SHOULDER ARTHROPLASTY Right 06/13/2017   TOTAL SHOULDER ARTHROPLASTY Right 06/13/2017   Procedure: TOTAL SHOULDER ARTHROPLASTY;  Surgeon: Dozier Soulier, MD;  Location: [facility];  Service: Orthopedics;  Laterality: Right;  Right total shoulder arthroplasty   TOTAL SHOULDER ARTHROPLASTY Left 01/22/2019   Procedure: TOTAL SHOULDER  ARTHROPLASTY;  Surgeon: Dozier Soulier, MD;  Location: [facility];  Service: Orthopedics;  Laterality: Left;   VAGINAL DELIVERY  1976    Allergies[1]  Prior to Admission medications  Medication Sig Start Date End Date Taking? Authorizing Provider  Alirocumab  (PRALUENT ) 150 MG/ML SOAJ INJECT 1 PEN INTO THE SKIN EVERY 14 DAYS 10/08/23  Yes Nahser, Aleene PARAS, MD  Coenzyme Q10 (COQ-10) 100 MG CAPS Take 100 capsules by mouth in the morning.   Yes [provider]  Evening Primrose Oil 1300 MG CAPS Take 1,300 mg by mouth in the morning.   Yes [provider]  ezetimibe  (ZETIA ) 10 MG tablet Take 1 tablet by mouth once daily 10/14/23  Yes Swinyer, Rosaline HERO, NP  Ibuprofen-Acetaminophen  125-250 MG TABS Take 2 tablets by mouth every 4 (four) hours as needed (pain).   Yes [provider]  losartan  (COZAAR ) 50 MG tablet Take 1 tablet by mouth once daily 09/23/23  Yes Nahser, Aleene PARAS, MD  Magnesium Glycinate (MAGNESIUM GLYCINATE ADVANCED) 120 MG CAPS Take 120 mg by mouth in the morning.   Yes [provider]  triamterene -hydrochlorothiazide  (MAXZIDE ) 75-50 MG tablet TAKE 1 TABLET BY MOUTH EVERY OTHER DAY AND ALTERNATE WITH 1/2 (ONE-HALF) TABLET THE OTHER DAY 09/30/23  Yes Nahser, Aleene PARAS, MD  Vitamin D -Vitamin K (VITAMIN K2-VITAMIN D3) 90-125 MCG CAPS Take 1 capsule by mouth in the morning.   Yes [provider]  Zinc 30 MG CAPS Take 30 mg by  mouth.   Yes [provider]    Social History   Socioeconomic History   Marital status: Single    Spouse name: Not on file   Number of children: 1   Years of education: Not on file   Highest education level: Bachelor's degree (e.g., BA, AB, BS)  Occupational History   Not on file  Tobacco Use   Smoking status: Never   Smokeless tobacco: Never  Vaping Use   Vaping status: Never Used  Substance and Sexual Activity   Alcohol use: Yes    Alcohol/week: 8.0 - 10.0 standard drinks of alcohol    Types: 8 - 10  Glasses of wine per week    Comment: week    Drug use: No   Sexual activity: Not Currently  Other Topics Concern   Not on file  Social History Narrative   Not on file   Social Drivers of Health   Tobacco Use: Low Risk (07/22/2024)   Patient History    Smoking Tobacco Use: Never    Smokeless Tobacco Use: Never    Passive Exposure: Not on file  Financial Resource Strain: Patient Declined (07/20/2024)   Overall Financial Resource Strain (CARDIA)    Difficulty of Paying Living Expenses: Patient declined  Food Insecurity: No Food Insecurity (07/22/2024)   Epic    Worried About Programme Researcher, Broadcasting/film/video in the Last Year: Never true    Ran Out of Food in the Last Year: Never true  Transportation Needs: No Transportation Needs (07/22/2024)   Epic    Lack of Transportation (Medical): No    Lack of Transportation (Non-Medical): No  Physical Activity: Insufficiently Active (07/20/2024)   Exercise Vital Sign    Days of Exercise per Week: 4 days    Minutes of Exercise per Session: 30 min  Stress: No Stress Concern Present (07/20/2024)   [employer] of Occupational Health - Occupational Stress Questionnaire    Feeling of Stress: Not at all  Social Connections: Unknown (07/22/2024)   Social Connection and Isolation Panel    Frequency of Communication with Friends and Family: Three times a week    Frequency of Social Gatherings with Friends and Family: Three times a week    Attends Religious Services: 1 to 4 times per year    Active Member of Clubs or Organizations: Yes    Attends Banker Meetings: 1 to 4 times per year    Marital Status: Patient declined  Intimate Partner Violence: Not At Risk (07/22/2024)   Epic    Fear of Current or Ex-Partner: No    Emotionally Abused: No    Physically Abused: No    Sexually Abused: No  Depression (PHQ2-9): Low Risk (04/16/2024)   Depression (PHQ2-9)    PHQ-2 Score: 0  Alcohol Screen: Low Risk (07/20/2024)   Alcohol Screen    Last  Alcohol Screening Score (AUDIT): 0  Housing: Low Risk (07/22/2024)   Epic    Unable to Pay for Housing in the Last Year: No    Number of Times Moved in the Last Year: 0    Homeless in the Last Year: No  Utilities: Not At Risk (07/22/2024)   Epic    Threatened with loss of utilities: No  Health Literacy: Not on file    Family History  Problem Relation Age of Onset   Atrial fibrillation Mother    Heart disease Father    Diverticulitis Father    Diverticulitis Sister    Kidney disease Sister  Leukemia Brother    Colon cancer Paternal Grandfather    Breast cancer Neg Hx    BRCA 1/2 Neg Hx     ROS: Otherwise negative unless mentioned in HPI  Physical Examination  Vitals:   07/22/24 0812 07/22/24 1219  BP: 139/72 126/60  Pulse: 90 88  Resp: 16 19  Temp: 98.1 F (36.7 C) 98.2 F (36.8 C)  SpO2: 96% 98%   Body mass index is 26.91 kg/m.  General:  WDWN in NAD Gait: Not observed HENT: WNL, normocephalic Pulmonary: normal non-labored breathing Cardiac: regular Abdomen:  soft, NT/ND, no masses Skin: without rashes Vascular Exam/Pulses: Symmetrical radial pulses Extremities: without ischemic changes, without Gangrene , without cellulitis; without open wounds;  Musculoskeletal: no muscle wasting or atrophy  Neurologic: A&O X 3;  No focal weakness or paresthesias are detected; speech is fluent/normal Psychiatric:  The pt has Normal affect. Lymph:  Unremarkable  CBC    Component Value Date/Time   WBC 9.4 07/22/2024 0400   RBC 3.86 (L) 07/22/2024 0400   HGB 12.3 07/22/2024 0400   HCT 35.8 (L) 07/22/2024 0400   PLT 243 07/22/2024 0400   MCV 92.7 07/22/2024 0400   MCH 31.9 07/22/2024 0400   MCHC 34.4 07/22/2024 0400   RDW 12.5 07/22/2024 0400   LYMPHSABS 1.4 07/21/2024 1249   MONOABS 0.8 07/21/2024 1249   EOSABS 0.2 07/21/2024 1249   BASOSABS 0.1 07/21/2024 1249    BMET    Component Value Date/Time   NA 136 07/22/2024 0400   NA 139 08/30/2020 0735   K 3.5  07/22/2024 0400   CL 98 07/22/2024 0400   CO2 25 07/22/2024 0400   GLUCOSE 117 (H) 07/22/2024 0400   BUN 15 07/22/2024 0400   BUN 23 08/30/2020 0735   CREATININE 0.67 07/22/2024 0400   CREATININE 0.83 03/23/2022 1604   CALCIUM  9.7 07/22/2024 0400   GFRNONAA >60 07/22/2024 0400   GFRAA 88 08/30/2020 0735    COAGS: Lab Results  Component Value Date   INR 1.0 05/04/2019   INR 0.9 01/19/2019   INR 0.96 06/12/2017     ASSESSMENT/PLAN: This is a 78 y.o. female with left-sided headache and double vision involving the left eye  Ms. Thresea Doble is a 78 year old female who presented to the ED with left-sided headache and double vision involving the left eye.  Workup included lab work demonstrating elevated ESR and CRP.  She was started on p.o. prednisone  daily.  Vascular surgery has been consulted to perform temporal artery biopsy.  This will be performed on the left side.  I discussed the case in detail with the patient and her husband.  On-call vascular surgeon Dr. Serene will evaluate the patient later today and provide further treatment plans including timing of biopsy.   Donnice Sender PA-C [facility] [phone]  I agree with the above.  I have seen and evaluated the patient.  Briefly this is a 78 year old female who presented to the emergency department with left-sided headache and double vision in the left eye.  There is concern over temporal arteritis.  She had an elevated sed rate and CRP.  She was started on prednisone  and her symptoms have improved slightly.  She had a ultrasound that did not show a halo sign however temporal artery biopsy has been requested.  She does have a palpable temporal artery pulse.  I discussed proceeding with biopsy tomorrow.  She will be n.p.o. after midnight.  I did discuss that we would need  to shave some of her hair in order to get a clean surgical incision.  She understands this and is willing to proceed.  Wells Maguire Killmer      [1]  Allergies Allergen Reactions   Lipitor [Atorvastatin Calcium ] Other (See Comments)    Muscle aches

## 2024-07-23 ENCOUNTER — Other Ambulatory Visit: Payer: Self-pay

## 2024-07-23 ENCOUNTER — Encounter (HOSPITAL_COMMUNITY): Payer: Self-pay | Admitting: Family Medicine

## 2024-07-23 ENCOUNTER — Observation Stay (HOSPITAL_COMMUNITY): Admitting: Anesthesiology

## 2024-07-23 ENCOUNTER — Encounter (HOSPITAL_COMMUNITY)
Admission: EM | Disposition: A | Discharge status: Home / Self Care | Payer: Self-pay | Source: Ambulatory Visit | Attending: Emergency Medicine

## 2024-07-23 DIAGNOSIS — M316 Other giant cell arteritis: Secondary | ICD-10-CM | POA: Diagnosis not present

## 2024-07-23 DIAGNOSIS — H538 Other visual disturbances: Secondary | ICD-10-CM | POA: Diagnosis not present

## 2024-07-23 DIAGNOSIS — R519 Headache, unspecified: Principal | ICD-10-CM

## 2024-07-23 HISTORY — PX: ARTERY BIOPSY: SHX891

## 2024-07-23 LAB — CBC
HCT: 35.6 % — ABNORMAL LOW (ref 36.0–46.0)
Hemoglobin: 12 g/dL (ref 12.0–15.0)
MCH: 31.3 pg (ref 26.0–34.0)
MCHC: 33.7 g/dL (ref 30.0–36.0)
MCV: 93 fL (ref 80.0–100.0)
Platelets: 297 K/uL (ref 150–400)
RBC: 3.83 MIL/uL — ABNORMAL LOW (ref 3.87–5.11)
RDW: 12.5 % (ref 11.5–15.5)
WBC: 10 K/uL (ref 4.0–10.5)
nRBC: 0 % (ref 0.0–0.2)

## 2024-07-23 LAB — BASIC METABOLIC PANEL WITH GFR
Anion gap: 12 (ref 5–15)
BUN: 25 mg/dL — ABNORMAL HIGH (ref 8–23)
CO2: 25 mmol/L (ref 22–32)
Calcium: 9.9 mg/dL (ref 8.9–10.3)
Chloride: 99 mmol/L (ref 98–111)
Creatinine, Ser: 0.8 mg/dL (ref 0.44–1.00)
GFR, Estimated: >60 mL/min (ref >60)
Glucose, Bld: 97 mg/dL (ref 70–99)
Potassium: 3.6 mmol/L (ref 3.5–5.1)
Sodium: 136 mmol/L (ref 135–145)

## 2024-07-23 SURGERY — BIOPSY TEMPORAL ARTERY
Anesthesia: General | Laterality: Left

## 2024-07-23 MED ORDER — PANTOPRAZOLE SODIUM 40 MG PO TBEC: 40.0000 mg | DELAYED_RELEASE_TABLET | Freq: Every day | ORAL | 0 refills | Status: AC

## 2024-07-23 MED ORDER — PROPOFOL 10 MG/ML IV BOLUS
INTRAVENOUS | Status: AC
  Filled 2024-07-23: qty 20

## 2024-07-23 MED ORDER — ACETAMINOPHEN 10 MG/ML IV SOLN
INTRAVENOUS | Status: AC
  Filled 2024-07-23: qty 100

## 2024-07-23 MED ORDER — ORAL CARE MOUTH RINSE: 15.0000 mL | Freq: Once | OROMUCOSAL | Status: AC

## 2024-07-23 MED ORDER — LIDOCAINE HCL (PF) 1 % IJ SOLN
INTRAMUSCULAR | Status: DC | PRN
  Administered 2024-07-23: 12:00:00 2 mL

## 2024-07-23 MED ORDER — CHLORHEXIDINE GLUCONATE 0.12 % MT SOLN
15.0000 mL | Freq: Once | OROMUCOSAL | Status: AC
  Administered 2024-07-23: 10:00:00 15 mL via OROMUCOSAL

## 2024-07-23 MED ORDER — PHENYLEPHRINE 80 MCG/ML (10ML) SYRINGE FOR IV PUSH (FOR BLOOD PRESSURE SUPPORT)
PREFILLED_SYRINGE | INTRAVENOUS | Status: DC | PRN
  Administered 2024-07-23 (×2): 80 ug via INTRAVENOUS

## 2024-07-23 MED ORDER — PHENYLEPHRINE HCL-NACL 20-0.9 MG/250ML-% IV SOLN
INTRAVENOUS | Status: DC | PRN
  Administered 2024-07-23: 11:00:00 50 ug/min via INTRAVENOUS

## 2024-07-23 MED ORDER — LIDOCAINE 2% (20 MG/ML) 5 ML SYRINGE
INTRAMUSCULAR | Status: AC
  Filled 2024-07-23: qty 5

## 2024-07-23 MED ORDER — 0.9 % SODIUM CHLORIDE (POUR BTL) OPTIME
TOPICAL | Status: DC | PRN
  Administered 2024-07-23: 12:00:00 1000 mL

## 2024-07-23 MED ORDER — ACETAMINOPHEN 10 MG/ML IV SOLN: 1000.0000 mg | Freq: Once | INTRAVENOUS | Status: DC | PRN

## 2024-07-23 MED ORDER — AMISULPRIDE (ANTIEMETIC) 5 MG/2ML IV SOLN: 10.0000 mg | Freq: Once | INTRAVENOUS | Status: DC | PRN

## 2024-07-23 MED ORDER — CHLORHEXIDINE GLUCONATE 0.12 % MT SOLN
OROMUCOSAL | Status: AC
  Filled 2024-07-23: qty 15

## 2024-07-23 MED ORDER — PHENYLEPHRINE 80 MCG/ML (10ML) SYRINGE FOR IV PUSH (FOR BLOOD PRESSURE SUPPORT)
PREFILLED_SYRINGE | INTRAVENOUS | Status: AC
  Filled 2024-07-23: qty 10

## 2024-07-23 MED ORDER — PREDNISONE 20 MG PO TABS: ORAL_TABLET | ORAL | 0 refills | Status: DC

## 2024-07-23 MED ORDER — LIDOCAINE HCL (PF) 1 % IJ SOLN
INTRAMUSCULAR | Status: AC
  Filled 2024-07-23: qty 30

## 2024-07-23 MED ORDER — LACTATED RINGERS IV SOLN: INTRAVENOUS | Status: DC

## 2024-07-23 MED ORDER — ACETAMINOPHEN 10 MG/ML IV SOLN
INTRAVENOUS | Status: DC | PRN
  Administered 2024-07-23: 12:00:00 1000 mg via INTRAVENOUS

## 2024-07-23 MED ORDER — ONDANSETRON HCL 4 MG/2ML IJ SOLN: 4.0000 mg | Freq: Once | INTRAMUSCULAR | Status: DC | PRN

## 2024-07-23 MED ORDER — ONDANSETRON HCL 4 MG/2ML IJ SOLN
INTRAMUSCULAR | Status: AC
  Filled 2024-07-23: qty 2

## 2024-07-23 MED ORDER — FENTANYL CITRATE (PF) 100 MCG/2ML IJ SOLN: 25.0000 ug | INTRAMUSCULAR | Status: DC | PRN

## 2024-07-23 MED ORDER — CEFAZOLIN SODIUM-DEXTROSE 2-3 GM-%(50ML) IV SOLR
INTRAVENOUS | Status: DC | PRN
  Administered 2024-07-23: 12:00:00 2 g via INTRAVENOUS

## 2024-07-23 MED ADMIN — Ondansetron HCl Inj 4 MG/2ML (2 MG/ML): 4 mg | INTRAVENOUS | @ 12:00:00 | NDC 00409475518

## 2024-07-23 MED ADMIN — PROPOFOL 200 MG/20ML IV EMUL: 150 mg | INTRAVENOUS | @ 11:00:00 | NDC 00069020910

## 2024-07-23 MED ADMIN — Lidocaine HCl Local Soln Prefilled Syringe 100 MG/5ML (2%): 60 mg | INTRAVENOUS | @ 11:00:00 | NDC 70004072309

## 2024-07-23 MED FILL — Cefazolin Sodium For Inj 1 GM: INTRAMUSCULAR | Qty: 20 | Status: AC

## 2024-07-23 SURGICAL SUPPLY — 35 items
BAG COUNTER SPONGE SURGICOUNT (BAG) ×1 IMPLANT
CANISTER SUCTION 3000ML PPV (SUCTIONS) ×1 IMPLANT
CLIP TI MEDIUM 6 (CLIP) ×1 IMPLANT
CLIP TI WIDE RED SMALL 6 (CLIP) ×1 IMPLANT
CNTNR URN SCR LID CUP LEK RST (MISCELLANEOUS) ×1 IMPLANT
COTTONBALL LRG STERILE PKG (GAUZE/BANDAGES/DRESSINGS) ×1 IMPLANT
COVER SURGICAL LIGHT HANDLE (MISCELLANEOUS) ×1 IMPLANT
DERMABOND ADVANCED .7 DNX12 (GAUZE/BANDAGES/DRESSINGS) ×1 IMPLANT
DRAPE OPHTHALMIC 77X100 STRL (CUSTOM PROCEDURE TRAY) ×1 IMPLANT
ELECT NDL BLADE 2-5/6 (NEEDLE) ×1 IMPLANT
ELECT NEEDLE BLADE 2-5/6 (NEEDLE) ×1 IMPLANT
ELECTRODE REM PT RTRN 9FT ADLT (ELECTROSURGICAL) ×1 IMPLANT
GAUZE 4X4 16PLY ~~LOC~~+RFID DBL (SPONGE) ×1 IMPLANT
GEL ULTRASOUND 8.5O AQUASONIC (MISCELLANEOUS) ×1 IMPLANT
GLOVE BIOGEL PI IND STRL 7.0 (GLOVE) ×1 IMPLANT
GOWN STRL REUS W/ TWL LRG LVL3 (GOWN DISPOSABLE) ×1 IMPLANT
GOWN STRL REUS W/ TWL XL LVL3 (GOWN DISPOSABLE) ×1 IMPLANT
KIT BASIN OR (CUSTOM PROCEDURE TRAY) ×1 IMPLANT
KIT TURNOVER KIT B (KITS) ×1 IMPLANT
NDL HYPO 25GX1X1/2 BEV (NEEDLE) ×1 IMPLANT
NEEDLE HYPO 25GX1X1/2 BEV (NEEDLE) ×1 IMPLANT
PACK GENERAL/GYN (CUSTOM PROCEDURE TRAY) ×1 IMPLANT
PAD ARMBOARD POSITIONER FOAM (MISCELLANEOUS) ×2 IMPLANT
POWDER SURGICEL 3.0 GRAM (HEMOSTASIS) IMPLANT
SOLN 0.9% NACL POUR BTL 1000ML (IV SOLUTION) ×1 IMPLANT
SOLN STERILE WATER BTL 1000 ML (IV SOLUTION) ×1 IMPLANT
SPIKE FLUID TRANSFER (MISCELLANEOUS) ×1 IMPLANT
SUCTION TUBE FRAZIER 10FR DISP (SUCTIONS) ×1 IMPLANT
SUT MNCRL AB 4-0 PS2 18 (SUTURE) ×1 IMPLANT
SUT PROLENE 6 0 BV (SUTURE) IMPLANT
SUT SILK 3-0 18XBRD TIE 12 (SUTURE) IMPLANT
SUT VIC AB 3-0 SH 27X BRD (SUTURE) ×1 IMPLANT
SYR CONTROL 10ML LL (SYRINGE) ×1 IMPLANT
TOWEL GREEN STERILE (TOWEL DISPOSABLE) ×1 IMPLANT
VASCULAR TIE MINI RED 18IN STL (MISCELLANEOUS) ×1 IMPLANT

## 2024-07-23 NOTE — Op Note (Signed)
° ° °  OPERATIVE NOTE  PROCEDURE:   Left temporal artery biopsy  PRE-OPERATIVE DIAGNOSIS: Left-sided headache, possible giant cell arteritis  POST-OPERATIVE DIAGNOSIS: same as above   SURGEON: Norman GORMAN Serve MD  ASSISTANT(S): none  ANESTHESIA: general  ESTIMATED BLOOD LOSS: Normal  FINDING(S): Tortuous healthy appearing left temporal artery  SPECIMEN(S): Left temporal artery sent to pathology  INDICATIONS:   Ashley Dyer is a 78 y.o. female who was admitted yesterday with a left-sided headache and blurry vision.  This is the first time she experienced a headache like this she also had a slight elevated ESR and vascular was consulted for temporal artery biopsy.  Risks and benefits were reviewed and she elected to proceed.  DESCRIPTION: The patient was brought to the operating room and positioned supine on the operating table.  Anesthesia was induced and the left temporal region was prepped and draped in usual sterile fashion.  A timeout was performed and preoperative antibiotics were administered. The left temporal artery was visible on the scalp and a pulse was palpable.  Incision was made overlying this region just anterior to the hairline.  This was carried deeper electrocautery and the anterior surface of the artery was visualized.  At this point I switched to sharp dissection.  The temporal artery was dissected free circumferentially throughout the length of the incision.  This was ligated distally with a silk tie and transected there was pulsatile bleeding which confirmed that this was in fact the artery.  It was then clamped proximally ligated with a silk tie and transected.  Both ends were also buttressed with a vascular clip The specimen was then passed off the field. The wound was then copiously irrigated and inspected for hemostasis.  The wound was then closed with a 4-0 Monocryl suture.  Dermabond was applied. All counts were correct, she tolerated the procedure well and  was transferred to PACU in stable condition.    COMPLICATIONS: None apparent  CONDITION: Able  Norman GORMAN Serve MD [facility] Phone Number: [phone] 07/23/2024 11:47 AM

## 2024-07-23 NOTE — Anesthesia Procedure Notes (Signed)
 Procedure Name: LMA Insertion Date/Time: 07/23/2024 11:10 AM  Performed by: Harrold Macintosh, CRNAPre-anesthesia Checklist: Patient identified, Emergency Drugs available, Suction available, Patient being monitored and Timeout performed Patient Re-evaluated:Patient Re-evaluated prior to induction Oxygen Delivery Method: Circle system utilized Preoxygenation: Pre-oxygenation with 100% oxygen Induction Type: IV induction LMA: LMA inserted LMA Size: 4.0 Number of attempts: 1 Placement Confirmation: positive ETCO2 and breath sounds checked- equal and bilateral Tube secured with: Tape Dental Injury: Teeth and Oropharynx as per pre-operative assessment

## 2024-07-23 NOTE — Progress Notes (Signed)
 Procedure consent obtained and placed inside patient's chart.

## 2024-07-23 NOTE — Discharge Summary (Signed)
 Physician Discharge Summary   Patient: Ashley Dyer MRN: [id number] DOB: Jul 04, 1946  Admit date:     07/21/2024  Discharge date: 07/23/2024  Discharge Physician: Casimer Dare   PCP: Kennyth Worth HERO, MD   Recommendations at discharge:    Follow up with neurology   Discharge Diagnoses: Principal Problem:   Headache Active Problems:   Essential hypertension   Vision disturbance   Hospital Course: 78 year old female with PMH of HTN, HLD, osteoarthritis who presented to the ED with headaches and vision change. Vision changes have been intermittent over the past 7 days, was evaluated in the outpatient clinic and eventually came to the ED. Her ESR and CRP were elevated, MRI brain and MRI orbits without any acute findings. Neurology was consulted for concern of GCA and oral steroids were started. Vascular surgery was consulted for temporal artery biopsy which she underwent on 07/23/24  Assessment and Plan:  Headaches and vision disturbances - concern for GCA with headache, tender left temple, and intermittent vision abnormalities.  Does have elevated ESR at 88, mildly elevated CRP.  MRI brain and MRI orbits with no acute findings - s/p left sided temporal artery biopsy on 12/18, f/u results as outpatient  - neurology consulted and appreciate recommendations - was started on oral steroids, will continue 50 mg for 2 weeks followed by 40 mg for 2 weeks. During this time she will f/u with neurology as outpatient to determine duration/taper of her steroids further  - started on PPI while on steroids, already on VitD supplements at home   HTN - continue losartan  and triamterene -HCTZ   HLD - doesn't tolerate statins, on Praluent  and ezetimibe , resume on discharge      Pain control -   Controlled Substance Reporting System database was reviewed. and patient was instructed, not to drive, operate heavy machinery, perform activities at heights, swimming or participation in water   activities or provide baby-sitting services while on Pain, Sleep and Anxiety Medications; until their outpatient Physician has advised to do so again. Also recommended to not to take more than prescribed Pain, Sleep and Anxiety Medications.  Consultants: Neurology, vascular surgery  Procedures performed: left temporal artery biopsy   Disposition: Home Diet recommendation:  Regular diet DISCHARGE MEDICATION: Allergies as of 07/23/2024       Reactions   Lipitor [atorvastatin Calcium ] Other (See Comments)   Muscle aches        Medication List     TAKE these medications    CoQ-10 100 MG Caps Take 100 capsules by mouth in the morning.   Evening Primrose Oil 1300 MG Caps Take 1,300 mg by mouth in the morning.   ezetimibe  10 MG tablet Commonly known as: ZETIA  Take 1 tablet by mouth once daily   Ibuprofen-Acetaminophen  125-250 MG Tabs Take 2 tablets by mouth every 4 (four) hours as needed (pain).   losartan  50 MG tablet Commonly known as: COZAAR  Take 1 tablet by mouth once daily   Magnesium Glycinate Advanced 120 MG Caps Generic drug: Magnesium Glycinate Take 120 mg by mouth in the morning.   pantoprazole  40 MG tablet Commonly known as: PROTONIX  Take 1 tablet (40 mg total) by mouth daily. Start taking on: July 24, 2024   Praluent  150 MG/ML Soaj Generic drug: Alirocumab  INJECT 1 PEN INTO THE SKIN EVERY 14 DAYS   predniSONE  20 MG tablet Commonly known as: DELTASONE  Take 2.5 tablets (50 mg total) by mouth daily with breakfast for 14 days, THEN 2 tablets (40 mg total) daily with  breakfast for 14 days. Start taking on: July 24, 2024   triamterene -hydrochlorothiazide  75-50 MG tablet Commonly known as: MAXZIDE  TAKE 1 TABLET BY MOUTH EVERY OTHER DAY AND ALTERNATE WITH 1/2 (ONE-HALF) TABLET THE OTHER DAY   Vitamin K2-Vitamin D3 90-125 MCG Caps Take 1 capsule by mouth in the morning.   Zinc 30 MG Caps Take 30 mg by mouth.        Discharge Exam: Filed  Weights   07/21/24 1046 07/22/24 1200 07/23/24 0942  Weight: 69.9 kg 71.1 kg 71.1 kg   Physical Exam Vitals and nursing note reviewed.  Constitutional:      General: She is not in acute distress. HENT:     Head:     Comments: Left sided forehead with incision site clean and dry  Cardiovascular:     Rate and Rhythm: Normal rate.  Pulmonary:     Effort: No respiratory distress.     Breath sounds: No wheezing.  Abdominal:     General: There is no distension.     Tenderness: There is no abdominal tenderness.  Musculoskeletal:     Right lower leg: No edema.     Left lower leg: No edema.      Condition at discharge: good  The results of significant diagnostics from this hospitalization (including imaging, microbiology, ancillary and laboratory) are listed below for reference.   Imaging Studies: VAS US  TEMPORAL ARTERY Result Date: 07/23/2024  TEMPORAL ARTERY REPORT [name]  Date of Exam:   07/22/2024 Medical Rec #: [id number]          Accession #:    [id number] Date of Birth: Oct 17, 1945          Patient Gender: F Patient Age:   45 years Exam Location:  [place] Procedure:      VAS US  TEMPORAL ARTERY Referring Phys: MCNEILL KIRKPATRICK --------------------------------------------------------------------------------  Indications: Headache and blurred vision. High Risk Factors: Age > 50 yrs and female.  Comparison Study: No previous exams Performing Technologist: Jody Hill RVT, RDMS  Examination Guidelines: Patient in reclined position. 2D, color and spectral doppler sampling in the temporal artery along the hairline and temple in the longitudinal plane. 2D images along the hairline and temple in the transverse plane. Exam is bilateral.  Summary: Absence of a halo sign in the bilateral temporal artery, although not definitive, makes a diagnosis of temporal arteritis unlikely.  *See table(s) above for measurements and observations.  Electronically signed by Eather Popp MD on 07/23/2024 at 11:34:49 AM.   Final    MR ORBITS W WO CONTRAST Result Date: 07/22/2024 EXAM: MRI ORBITS WITHOUT AND WITH CONTRAST 07/21/2024 09:55:13 PM TECHNIQUE: Multiplanar, multisequence MRI of the orbits was performed without and with intravenous contrast. 7 mL gadobutrol  (GADAVIST ) 1 MMOL/ML injection. COMPARISON: Comparison made with concomitant MRI of the brain performed at the same time. CLINICAL HISTORY: Diplopia FINDINGS: ORBITS: Normal globes. Prior bilateral ocular lens replacement. Symmetric caliber and signal of the optic nerves. Symmetric extraocular muscles. No orbital mass. No abnormal enhancement. SINUSES AND MASTOIDS: Clear. BRAIN: No acute abnormality. BONES AND SOFT TISSUES: Normal bone marrow signal. No acute soft tissue abnormality. IMPRESSION: 1. Negative MRI of the orbits. No acute abnormality . Electronically signed by: Morene Hoard MD 07/22/2024 12:15 AM EST RP Workstation: [id number]   MR Brain W and Wo Contrast Result Date: 07/22/2024 EXAM: MRI BRAIN WITH AND WITHOUT CONTRAST 07/21/2024 09:55:13 PM TECHNIQUE: Multiplanar multisequence MRI of the head/brain was performed with and without the  administration of 7 mL of gadobutrol  (GADAVIST ) 1 MMOL/ML intravenous contrast. COMPARISON: Comparison made with head CT from earlier the same day. CLINICAL HISTORY: Headache, new onset (Age >= 51y) FINDINGS: BRAIN AND VENTRICLES: No acute infarct. No acute intracranial hemorrhage. No mass effect or midline shift. No hydrocephalus. The sella is unremarkable. Normal flow voids. No mass or abnormal enhancement. A few subcentimeter foci of T2/FLAIR hyperintensity are seen involving the supratentorial cerebral white matter and pons, likely related to chronic microvascular ischemic disease, minimal in nature and less than is typically seen for age. ORBITS: No acute abnormality. Prior bilateral ocular lens replacement. SINUSES: No acute abnormality. BONES AND SOFT TISSUES: Normal  bone marrow signal and enhancement. No acute soft tissue abnormality. IMPRESSION: 1. Normal  brain MRI for age. No acute intracranial abnormality. Electronically signed by: Morene Hoard MD 07/22/2024 12:09 AM EST RP Workstation: [id number]   CT Head Wo Contrast Result Date: 07/21/2024 EXAM: CT HEAD WITHOUT CONTRAST 07/21/2024 12:32:00 PM TECHNIQUE: CT of the head was performed without the administration of intravenous contrast. Automated exposure control, iterative reconstruction, and/or weight based adjustment of the mA/kV was utilized to reduce the radiation dose to as low as reasonably achievable. COMPARISON: None available. CLINICAL HISTORY: Headache, new onset (Age >= 51y). FINDINGS: BRAIN AND VENTRICLES: No acute hemorrhage. No evidence of acute infarct. Dilated perivascular space within the right basal ganglia. 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 No hydrocephalus. No extra-axial collection. No mass effect or midline shift. ORBITS: Bilateral lens replacement noted. SINUSES: No acute abnormality. SOFT TISSUES AND SKULL: No acute soft tissue abnormality. No skull fracture. VASCULATURE: Mild calcific atheromatous disease within the carotid siphons. IMPRESSION: 1. No acute intracranial abnormality related to the new onset headache. 2. 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 Electronically signed by: Evalene Coho MD 07/21/2024 12:56 PM EST RP Workstation: [id number]    Microbiology: Results for orders placed or performed in visit on 03/23/22  Urine Culture     Status: None   Collection Time: 03/23/22  4:04 PM   Specimen: Urine  Result Value Ref Range Status   MICRO NUMBER: [id number]  Final   SPECIMEN QUALITY: Adequate  Final   Sample Source URINE, CLEAN CATCH  Final   STATUS: FINAL  Final   Result:   Final    Less than 10,000 CFU/mL of single Gram positive organism isolated. No further testing will be performed. If clinically indicated, recollection using a method  to minimize contamination, with prompt transfer to Urine Culture Transport Tube, is recommended.    Labs: CBC: Recent Labs  Lab 07/21/24 1249 07/22/24 0400 07/23/24 0237  WBC 7.7 9.4 10.0  NEUTROABS 5.2  --   --   HGB 13.7 12.3 12.0  HCT 40.8 35.8* 35.6*  MCV 93.4 92.7 93.0  PLT 270 243 297   Basic Metabolic Panel: Recent Labs  Lab 07/21/24 1249 07/22/24 0400 07/23/24 0237  NA 138 136 136  K 3.6 3.5 3.6  CL 99 98 99  CO2 26 25 25   GLUCOSE 91 117* 97  BUN 16 15 25*  CREATININE 0.75 0.67 0.80  CALCIUM  10.1 9.7 9.9   Liver Function Tests: No results for input(s): AST, ALT, ALKPHOS, BILITOT, PROT, ALBUMIN in the last 168 hours. CBG: No results for input(s): GLUCAP in the last 168 hours.  Discharge time spent: 38 minutes  Signed: Casimer Dare, MD [facility] 07/23/2024

## 2024-07-23 NOTE — Transfer of Care (Signed)
 Immediate Anesthesia Transfer of Care Note  Patient: Ashley Dyer  Procedure(s) Performed: BIOPSY TEMPORAL ARTERY (Left)  Patient Location: PACU  Anesthesia Type:General  Level of Consciousness: awake, drowsy, patient cooperative, and responds to stimulation  Airway & Oxygen Therapy: Patient Spontanous Breathing and Patient connected to face mask oxygen  Post-op Assessment: Report given to RN, Post -op Vital signs reviewed and stable, Patient moving all extremities X 4, and Patient able to stick tongue midline  Post vital signs: Reviewed and stable  Last Vitals:  Vitals Value Taken Time  BP 115/60 07/23/24 11:56  Temp 98.1   Pulse 71 07/23/24 11:58  Resp 14 07/23/24 11:58  SpO2 99 % 07/23/24 11:58  Vitals shown include unfiled device data.  Last Pain:  Vitals:   07/23/24 0949  TempSrc:   PainSc: 3          Complications: No notable events documented.

## 2024-07-23 NOTE — Anesthesia Preprocedure Evaluation (Addendum)
 Anesthesia Evaluation  Patient identified by MRN, date of birth, ID band Patient awake    Reviewed: Allergy & Precautions, NPO status , Patient's Chart, lab work & pertinent test results  Airway Mallampati: II  TM Distance: >3 FB Neck ROM: Full    Dental no notable dental hx.    Pulmonary asthma    Pulmonary exam normal        Cardiovascular hypertension, Pt. on medications Normal cardiovascular exam     Neuro/Psych  Headaches  Anxiety      Neuromuscular disease    GI/Hepatic negative GI ROS, Neg liver ROS,,,  Endo/Other  negative endocrine ROS    Renal/GU      Musculoskeletal   Abdominal   Peds  Hematology  (+) Blood dyscrasia, anemia   Anesthesia Other Findings giant cell arteritis  Reproductive/Obstetrics                              Anesthesia Physical Anesthesia Plan  ASA: 2  Anesthesia Plan: General   Post-op Pain Management:    Induction: Intravenous  PONV Risk Score and Plan: 3 and Ondansetron , Dexamethasone  and Treatment may vary due to age or medical condition  Airway Management Planned: LMA  Additional Equipment:   Intra-op Plan:   Post-operative Plan: Extubation in OR  Informed Consent: I have reviewed the patients History and Physical, chart, labs and discussed the procedure including the risks, benefits and alternatives for the proposed anesthesia with the patient or authorized representative who has indicated his/her understanding and acceptance.     Dental advisory given  Plan Discussed with: CRNA  Anesthesia Plan Comments:         Anesthesia Quick Evaluation

## 2024-07-23 NOTE — Progress Notes (Signed)
 NEUROLOGY CONSULT FOLLOW UP NOTE   Date of service: July 23, 2024 Patient Name: Ashley Dyer MRN:  [id number] DOB:  1945-10-01  Interval Hx/subjective   She reports her diplopia is improved, still has some headache Vitals   Vitals:   07/23/24 1200 07/23/24 1215 07/23/24 1230 07/23/24 1313  BP: 124/62 (!) 121/59 (!) 121/56 (!) 122/52  Pulse: 71 71 73 70  Resp: 14 14 17 17   Temp:   98.1 F (36.7 C) 98.3 F (36.8 C)  TempSrc:    Oral  SpO2: 96% 96% 94% 96%  Weight:      Height:         Body mass index is 26.91 kg/m.  Physical Exam   Constitutional: Appears well-developed and well-nourished.  Neurologic Examination    MS: Awake, alert, oriented and appropriate CN: Mild diplopia on leftward gaze Motor: Moves all extremities well Sensory: Intact to light touch  Medications Current Medications[1]  Labs and Diagnostic Imaging   ESR 88 CRP 5.1 MRI brain and orbits are normal  Assessment   Ashley Dyer is a 78 y.o. female with new onset bitemporal headaches, diplopia with elevated ESR and CRP and temporal tenderness.  My concern for temporal arteritis is really quite high and she has been started on steroids.  Recommendations  Temporal artery biopsy Prednisone  50 mg daily for at least 2 weeks followed by 40 mg daily for at least 2 weeks, down titration at that point would be based on symptoms Follow-up with outpatient neurology ______________________________________________________________________   Signed, Aisha Seals, MD [facility]      [1]  Current Facility-Administered Medications:    acetaminophen  (TYLENOL ) tablet 650 mg, 650 mg, Oral, Q6H PRN, 650 mg at 07/22/24 2035 **OR** acetaminophen  (TYLENOL ) suppository 650 mg, 650 mg, Rectal, Q6H PRN, Rhyne, Samantha J, PA-C   chlorhexidine  (PERIDEX ) 0.12 % solution, , , ,    fentaNYL  (SUBLIMAZE ) injection 12.5-50 mcg, 12.5-50 mcg, Intravenous, Q2H PRN, Rhyne, Samantha J,  PA-C   losartan  (COZAAR ) tablet 50 mg, 50 mg, Oral, Daily, Rhyne, Samantha J, PA-C   melatonin tablet 3 mg, 3 mg, Oral, QHS PRN, Rhyne, Samantha J, PA-C   oxyCODONE  (Oxy IR/ROXICODONE ) immediate release tablet 2.5-5 mg, 2.5-5 mg, Oral, Q4H PRN, Rhyne, Samantha J, PA-C   pantoprazole  (PROTONIX ) EC tablet 40 mg, 40 mg, Oral, Daily, Rhyne, Samantha J, PA-C, 40 mg at 07/23/24 0856   polyethylene glycol (MIRALAX  / GLYCOLAX ) packet 17 g, 17 g, Oral, Daily PRN, Rhyne, Samantha J, PA-C, 17 g at 07/22/24 2035   predniSONE  (DELTASONE ) tablet 50 mg, 50 mg, Oral, Q breakfast, Rhyne, Samantha J, PA-C, 50 mg at 07/23/24 0850   prochlorperazine  (COMPAZINE ) injection 5 mg, 5 mg, Intravenous, Q6H PRN, Rhyne, Samantha J, PA-C   sodium chloride  flush (NS) 0.9 % injection 3 mL, 3 mL, Intravenous, Q12H, Rhyne, Samantha J, PA-C, 3 mL at 07/23/24 9146   triamterene -hydrochlorothiazide  (MAXZIDE -25) 37.5-25 MG per tablet 1 tablet, 1 tablet, Oral, Q48H **AND** triamterene -hydrochlorothiazide  (MAXZIDE -25) 37.5-25 MG per tablet 2 tablet, 2 tablet, Oral, Q48H, Rhyne, Samantha J, PA-C, 2 tablet at 07/23/24 [phone]

## 2024-07-23 NOTE — Progress Notes (Signed)
°  Daily Progress Note   Subjective: Pain somewhat improved but still having left sided headache  Objective: Vitals:   07/23/24 0737 07/23/24 0942  BP: 124/64 (!) 135/54  Pulse: 68 78  Resp:  18  Temp: 98 F (36.7 C) 98.3 F (36.8 C)  SpO2: 99% 98%    Physical Examination HDS Nonlabored breathing L temporal artery palpable  ASSESSMENT/PLAN:  78 y/o female with left sided headache and blurry vision. Plan for left side temporal artery biopsy.   Norman GORMAN Serve MD [facility] [phone] 07/23/2024  10:44 AM

## 2024-07-24 ENCOUNTER — Encounter (HOSPITAL_COMMUNITY): Payer: Self-pay | Admitting: Vascular Surgery

## 2024-07-24 LAB — SURGICAL PATHOLOGY

## 2024-07-24 NOTE — Anesthesia Postprocedure Evaluation (Signed)
"   Anesthesia Post Note  Patient: Ashley Dyer  Procedure(s) Performed: BIOPSY TEMPORAL ARTERY (Left)     Patient location during evaluation: PACU Anesthesia Type: General Level of consciousness: awake Pain management: pain level controlled Vital Signs Assessment: post-procedure vital signs reviewed and stable Respiratory status: spontaneous breathing, nonlabored ventilation and respiratory function stable Cardiovascular status: blood pressure returned to baseline and stable Postop Assessment: no apparent nausea or vomiting Anesthetic complications: no   No notable events documented.  Last Vitals:  Vitals:   07/23/24 1230 07/23/24 1313  BP: (!) 121/56 (!) 122/52  Pulse: 73 70  Resp: 17 17  Temp: 36.7 C 36.8 C  SpO2: 94% 96%    Last Pain:  Vitals:   07/23/24 1313  TempSrc: Oral  PainSc:                  Ladaisha Portillo P Tedford Berg      "

## 2024-07-27 ENCOUNTER — Encounter: Payer: Self-pay | Admitting: Diagnostic Neuroimaging

## 2024-07-27 ENCOUNTER — Ambulatory Visit: Admitting: Diagnostic Neuroimaging

## 2024-07-27 VITALS — BP 112/70 | HR 81 | Ht 64.5 in | Wt 156.6 lb

## 2024-07-27 DIAGNOSIS — M316 Other giant cell arteritis: Secondary | ICD-10-CM

## 2024-07-27 MED ORDER — PREDNISONE 10 MG PO TABS: 40.0000 mg | ORAL_TABLET | Freq: Every day | ORAL | 6 refills | Status: AC

## 2024-07-27 NOTE — Patient Instructions (Addendum)
" °  LEFT TEMPORAL ARTERITIS (positive biopsy; elevated CRP, ESR)  - continue prednisone  50mg  daily for now; repeat labs; then will plan for slow taper (will give 10mg  tabs for future tapering dose)  - continue omeprazole (stomach protection)  - consider calcium  + vitamin D  (on hold due to history of kidney stones) "

## 2024-07-27 NOTE — Progress Notes (Signed)
 "  [facility]  PATIENT: Ashley Dyer DOB: March 01, 1946  REFERRING CLINICIAN: Michaela Aisha SQUIBB, MD HISTORY FROM: patient  REASON FOR VISIT: new consult   HISTORICAL  CHIEF COMPLAINT:  Chief Complaint  Patient presents with   RM 6     Patient is here with her daughter Cali for headaches - would like to discuss treatment and results. She was in the hospital on 07/21/24 and was released on the 07/24/24. She sometimes has a tightness in her jaw and the back of there neck. She had spasms in both legs as well. Has been having some issues with confusion. Only short term memory is affected     HISTORY OF PRESENT ILLNESS:   78 year old female here for evaluation of left temporal arteritis.  07/14/2024 patient had onset of left-sided headache and blurred / double vision.  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  She went to her PCP on 07/16/2024.  She complained of some veins over her left temporal region that were starting to swell up.  ESR and CRP were ordered to rule out temporal arteritis.  Patient was also complaining of some double vision.  Lab test resulted as elevated and patient was referred to the emergency room for further evaluation.  ESR and CRP were repeated and now more significantly elevated.  Patient was started on prednisone  50 mg daily and underwent left temporal artery biopsy.  Symptoms improved after starting prednisone .  Patient was discharged on 07/23/2024.  She has been doing well since that time.  This morning she woke up and her neck was slightly stiff but has improved over time.  Having some intermittent cramps in her legs.  Some mild side effects of prednisone  including anxiety and jittery feeling.  Vision is improved with no problems.   REVIEW OF SYSTEMS: Full 14 system review of systems performed and negative with exception of: as per HPI.  ALLERGIES: Allergies[1]   HOME  MEDICATIONS: Outpatient Medications Prior to Visit  Medication Sig Dispense Refill   Alirocumab  (PRALUENT ) 150 MG/ML SOAJ INJECT 1 PEN INTO THE SKIN EVERY 14 DAYS 2 mL 11   Coenzyme Q10 (COQ-10) 100 MG CAPS Take 100 capsules by mouth in the morning.     Evening Primrose Oil 1300 MG CAPS Take 1,300 mg by mouth in the morning.     ezetimibe  (ZETIA ) 10 MG tablet Take 1 tablet by mouth once daily 90 tablet 3   Ibuprofen-Acetaminophen  125-250 MG TABS Take 2 tablets by mouth every 4 (four) hours as needed (pain).     losartan  (COZAAR ) 50 MG tablet Take 1 tablet by mouth once daily 90 tablet 3   Magnesium Glycinate (MAGNESIUM GLYCINATE ADVANCED) 120 MG CAPS Take 120 mg by mouth in the morning.     pantoprazole  (PROTONIX ) 40 MG tablet Take 1 tablet (40 mg total) by mouth daily. 30 tablet 0   triamterene -hydrochlorothiazide  (MAXZIDE ) 75-50 MG tablet TAKE 1 TABLET BY MOUTH EVERY OTHER DAY AND ALTERNATE WITH 1/2 (ONE-HALF) TABLET THE OTHER DAY 60 tablet 3   Vitamin D -Vitamin K (VITAMIN K2-VITAMIN D3) 90-125 MCG CAPS Take 1 capsule by mouth in the morning.     Zinc 30 MG CAPS Take 30 mg by mouth.     predniSONE  (DELTASONE ) 20 MG tablet Take 2.5 tablets (50 mg total) by mouth daily with breakfast for 14 days, THEN 2 tablets (40 mg total) daily with breakfast for 14 days. 63 tablet 0  No facility-administered medications prior to visit.    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   Walking pneumonia     PAST SURGICAL HISTORY: Past Surgical History:  Procedure Laterality Date   ARTERY BIOPSY Left 07/23/2024   Procedure: BIOPSY TEMPORAL ARTERY;   Surgeon: Pearline Norman RAMAN, MD;  Location: [facility] OR;  Service: Vascular;  Laterality: Left;   CATARACT EXTRACTION Bilateral    EYE SURGERY     KIDNEY STONE SURGERY     NEPHROLITHOTOMY Right 10/22/2019   Procedure: RIGHT NEPHROLITHOTOMY PERCUTANEOUS WITH SURGEON ACCESS;  Surgeon: Cam Morene ORN, MD;  Location: [facility] ORS;  Service: Urology;  Laterality: Right;   TONSILLECTOMY     TOTAL HIP ARTHROPLASTY Left 05/11/2019   Procedure: LEFT TOTAL HIP ARTHROPLASTY ANTERIOR APPROACH;  Surgeon: Liam Lerner, MD;  Location: [facility] ORS;  Service: Orthopedics;  Laterality: Left;   TOTAL SHOULDER ARTHROPLASTY Right 06/13/2017   TOTAL SHOULDER ARTHROPLASTY Right 06/13/2017   Procedure: TOTAL SHOULDER ARTHROPLASTY;  Surgeon: Dozier Soulier, MD;  Location: [facility] OR;  Service: Orthopedics;  Laterality: Right;  Right total shoulder arthroplasty   TOTAL SHOULDER ARTHROPLASTY Left 01/22/2019   Procedure: TOTAL SHOULDER ARTHROPLASTY;  Surgeon: Dozier Soulier, MD;  Location: [facility] ORS;  Service: Orthopedics;  Laterality: Left;   VAGINAL DELIVERY  1976    FAMILY HISTORY: Family History  Problem Relation Age of Onset   Atrial fibrillation Mother    Multiple sclerosis Father    Heart disease Father    Diverticulitis Father    Diverticulitis Sister    Kidney disease Sister    Leukemia Brother    Colon cancer Paternal Grandfather    Breast cancer Neg Hx    BRCA 1/2 Neg Hx    Seizures Neg Hx    Stroke Neg Hx     SOCIAL HISTORY: Social History   Socioeconomic History   Marital status: Single    Spouse name: Not on file   Number of children: 1   Years of education: Not on file   Highest education level: Bachelor's degree (e.g., BA, AB, BS)  Occupational History   Not on file  Tobacco Use   Smoking status: Never   Smokeless tobacco: Never  Vaping Use   Vaping status: Never Used  Substance and Sexual Activity   Alcohol use: Yes    Alcohol/week: 8.0 - 10.0 standard drinks of alcohol    Types: 8 - 10 Glasses  of wine per week    Comment: week    Drug use: No   Sexual activity: Not Currently  Other Topics Concern   Not on file  Social History Narrative   1 mug of coffee daily, no soda    Social Drivers of Health   Tobacco Use: Low Risk (07/27/2024)   Patient History    Smoking Tobacco Use: Never    Smokeless Tobacco Use: Never    Passive Exposure: Not on file  Financial Resource Strain: Patient Declined (07/20/2024)   Overall Financial Resource Strain (CARDIA)    Difficulty of Paying Living  Expenses: Patient declined  Food Insecurity: No Food Insecurity (07/22/2024)   Epic    Worried About Programme Researcher, Broadcasting/film/video in the Last Year: Never true    Ran Out of Food in the Last Year: Never true  Transportation Needs: No Transportation Needs (07/22/2024)   Epic    Lack of Transportation (Medical): No    Lack of Transportation (Non-Medical): No  Physical Activity: Insufficiently Active (07/20/2024)   Exercise Vital Sign    Days of Exercise per Week: 4 days    Minutes of Exercise per Session: 30 min  Stress: No Stress Concern Present (07/20/2024)   [employer] of Occupational Health - Occupational Stress Questionnaire    Feeling of Stress: Not at all  Social Connections: Unknown (07/22/2024)   Social Connection and Isolation Panel    Frequency of Communication with Friends and Family: Three times a week    Frequency of Social Gatherings with Friends and Family: Three times a week    Attends Religious Services: 1 to 4 times per year    Active Member of Clubs or Organizations: Yes    Attends Banker Meetings: 1 to 4 times per year    Marital Status: Patient declined  Intimate Partner Violence: Not At Risk (07/22/2024)   Epic    Fear of Current or Ex-Partner: No    Emotionally Abused: No    Physically Abused: No    Sexually Abused: No  Depression (PHQ2-9): Low Risk (04/16/2024)   Depression (PHQ2-9)    PHQ-2 Score: 0  Alcohol Screen: Low Risk (07/20/2024)   Alcohol  Screen    Last Alcohol Screening Score (AUDIT): 0  Housing: Low Risk (07/22/2024)   Epic    Unable to Pay for Housing in the Last Year: No    Number of Times Moved in the Last Year: 0    Homeless in the Last Year: No  Utilities: Not At Risk (07/22/2024)   Epic    Threatened with loss of utilities: No  Health Literacy: Not on file     PHYSICAL EXAM  GENERAL EXAM/CONSTITUTIONAL: Vitals:  Vitals:   07/27/24 1522  BP: 112/70  Pulse: 81  SpO2: 99%  Weight: 156 lb 9.6 oz (71 kg)  Height: 5' 4.5 (1.638 m)   Body mass index is 26.47 kg/m. Wt Readings from Last 3 Encounters:  07/27/24 156 lb 9.6 oz (71 kg)  07/23/24 156 lb 12 oz (71.1 kg)  07/16/24 156 lb 6.4 oz (70.9 kg)   Patient is in no distress; well developed, nourished and groomed; neck is supple  CARDIOVASCULAR: Examination of carotid arteries is normal; no carotid bruits Regular rate and rhythm, no murmurs Examination of peripheral vascular system by observation and palpation is normal LEFT TEMPORAL ARTERY BIOPSY SITE CDI; NO TENDERNESS  EYES: Ophthalmoscopic exam of optic discs and posterior segments is normal; no papilledema or hemorrhages No results found.  MUSCULOSKELETAL: Gait, strength, tone, movements noted in Neurologic exam below  NEUROLOGIC: MENTAL STATUS:      No data to display         awake, alert, oriented to person, place and time recent and remote memory intact normal attention and concentration language fluent, comprehension intact, naming intact fund of knowledge appropriate  CRANIAL NERVE:  2nd - no papilledema on fundoscopic exam 2nd, 3rd, 4th, 6th - pupils equal and reactive to light, visual fields full to confrontation, extraocular muscles intact, no nystagmus 5th - facial sensation symmetric 7th - facial strength symmetric 8th - hearing  intact 9th - palate elevates symmetrically, uvula midline 11th - shoulder shrug symmetric 12th - tongue protrusion midline  MOTOR:   normal bulk and tone, full strength in the BUE, BLE  SENSORY:  normal and symmetric to light touch, temperature, vibration  COORDINATION:  finger-nose-finger, fine finger movements normal  REFLEXES:  deep tendon reflexes present and symmetric  GAIT/STATION:  narrow based gait    DIAGNOSTIC DATA (LABS, IMAGING, TESTING) - I reviewed patient records, labs, notes, testing and imaging myself where available.  Lab Results  Component Value Date   WBC 10.0 07/23/2024   HGB 12.0 07/23/2024   HCT 35.6 (L) 07/23/2024   MCV 93.0 07/23/2024   PLT 297 07/23/2024      Component Value Date/Time   NA 136 07/23/2024 0237   NA 139 08/30/2020 0735   K 3.6 07/23/2024 0237   CL 99 07/23/2024 0237   CO2 25 07/23/2024 0237   GLUCOSE 97 07/23/2024 0237   BUN 25 (H) 07/23/2024 0237   BUN 23 08/30/2020 0735   CREATININE 0.80 07/23/2024 0237   CREATININE 0.83 03/23/2022 1604   CALCIUM  9.9 07/23/2024 0237   PROT 7.3 04/21/2024 0843   PROT 7.0 05/22/2021 0754   ALBUMIN 4.5 04/21/2024 0843   ALBUMIN 4.6 05/22/2021 0754   AST 25 04/21/2024 0843   ALT 16 04/21/2024 0843   ALKPHOS 69 04/21/2024 0843   BILITOT 0.7 04/21/2024 0843   BILITOT 0.6 05/22/2021 0754   GFRNONAA >60 07/23/2024 0237   GFRAA 88 08/30/2020 0735   Lab Results  Component Value Date   CHOL 149 04/21/2024   HDL 48.50 04/21/2024   LDLCALC 57 04/21/2024   TRIG 219.0 (H) 04/21/2024   CHOLHDL 3 04/21/2024   Lab Results  Component Value Date   HGBA1C 6.2 04/21/2024   No results found for: VITAMINB12 Lab Results  Component Value Date   TSH 1.85 04/21/2024   No components found for: ESR  No components found for: SEDRATE  Sed Rate  Date Value Ref Range Status  07/21/2024 88 (H) 0 - 22 mm/hr Final    Comment:    Performed at [facility], [address], [place], [place] [address]  07/17/2024 53 (H) 0 - 30 mm/hr Final   CRP  Date Value Ref Range Status  07/21/2024 5.1 (H) <1.0 mg/dL  Final    Comment:    Performed at [facility], [address], [place], [place] [address]  07/17/2024 3.3 0.5 - 20.0 mg/dL Final    87/82/74 temporal artery biopsy - Absence of a halo sign in the bilateral temporal artery, although not  definitive, makes a diagnosis of temporal arteritis unlikely.   07/23/24 A. TEMPORAL ARTERY, LEFT, BIOPSY:  -  Findings consistent with temporal arteritis, see note.   Note: While definitive giant cells are not present there is concentric  inflammation consisting of lymphocytes, histiocytes and eosinophils in  the vessel wall with damage to the elastic lamina and fibrinoid necrosis  as well as associated intimal hyperplasia.  Only 50 to 70% of cases show  the classic giant cells, and the overall findings are consistent with  temporal arteritis in the appropriate clinical setting.   07/21/24 MRI brain (with and without) [I reviewed images myself and agree with interpretation. -VRP]  1. Normal  brain MRI for age. No acute intracranial abnormality.   07/21/24 MRI orbit (with and without)  1. Negative MRI of the orbits. No acute abnormality .  ASSESSMENT AND PLAN  78 y.o. year old female here with:   Dx:  1. Temporal arteritis (HCC)     PLAN:  LEFT TEMPORAL ARTERITIS (positive biopsy; elevated CRP, ESR) - continue prednisone  50mg  daily for now; repeat labs; then will plan for slow taper (will give 10mg  tabs for future tapering dose); consider IV solumedrol or Actemra if worsening or refractory symptoms - continue omeprazole (stomach protection) - consider calcium  + vitamin D  (on hold due to history of kidney stones)  Orders Placed This Encounter  Procedures   Sedimentation Rate   C-reactive Protein   ANA,IFA RA Diag Pnl w/rflx Tit/Patn   ANCA Profile   Meds ordered this encounter  Medications   predniSONE  (DELTASONE ) 10 MG tablet    Sig: Take 4 tablets (40 mg total) by mouth daily with breakfast. As directed.     Dispense:  120 tablet    Refill:  6   Return in about 3 months (around 10/25/2024).  I spent 60 minutes of face-to-face and non-face-to-face time with patient.  This included previsit chart review, lab review, study review, order entry, electronic health record documentation, patient education.     EDUARD FABIENE HANLON, MD 07/27/2024, 4:16 PM Certified in Neurology, Neurophysiology and Neuroimaging  [facility] [address] [place], [place] [address] [phone]     [1]  Allergies Allergen Reactions   Lipitor [Atorvastatin Calcium ] Other (See Comments)    Muscle aches   "

## 2024-07-28 ENCOUNTER — Inpatient Hospital Stay: Admitting: Family

## 2024-07-29 ENCOUNTER — Inpatient Hospital Stay: Admitting: Physician Assistant

## 2024-07-29 LAB — ANA,IFA RA DIAG PNL W/RFLX TIT/PATN
ANA Titer 1: POSITIVE — AB
Cyclic Citrullin Peptide Ab: 7 U (ref 0–19)
Rheumatoid fact SerPl-aCnc: 11.3 [IU]/mL

## 2024-07-29 LAB — FANA STAINING PATTERNS: Speckled Pattern: 1:320 {titer} — ABNORMAL HIGH

## 2024-07-29 LAB — ANCA PROFILE
Anti-MPO Antibodies: <0.2 U (ref 0.0–0.9)
Anti-PR3 Antibodies: <0.2 U (ref 0.0–0.9)
Atypical pANCA: <1:20 {titer}
C-ANCA: <1:20 {titer}
P-ANCA: <1:20 {titer}

## 2024-07-29 LAB — SEDIMENTATION RATE: Sed Rate: 49 mm/h — ABNORMAL HIGH (ref 0–40)

## 2024-07-29 LAB — C-REACTIVE PROTEIN: CRP: 4 mg/L (ref 0–10)

## 2024-08-10 ENCOUNTER — Encounter: Payer: Self-pay | Admitting: Diagnostic Neuroimaging

## 2024-08-13 ENCOUNTER — Encounter: Payer: Self-pay | Admitting: Family Medicine

## 2024-08-13 ENCOUNTER — Ambulatory Visit: Admitting: Family Medicine

## 2024-08-13 VITALS — BP 120/60 | HR 77 | Temp 97.7°F | Ht 64.5 in | Wt 152.2 lb

## 2024-08-13 DIAGNOSIS — M316 Other giant cell arteritis: Secondary | ICD-10-CM | POA: Diagnosis not present

## 2024-08-13 DIAGNOSIS — I10 Essential (primary) hypertension: Secondary | ICD-10-CM | POA: Diagnosis not present

## 2024-08-13 NOTE — Progress Notes (Signed)
 "  Ashley Dyer is a 79 y.o. female who presents today for an office visit.  Assessment/Plan:  Chronic Problems Addressed Today: Temporal arteritis (HCC) I had a lengthy discussion with patient today regarding her recent hospitalization and treatment for her temporal arteritis.  Symptoms have improved significantly and she is now on prednisone  40 mg daily per neurology.  She will follow-up with them again in about 6 weeks but is planning on continuing current dose for now.  We did discuss warning signs and reasons to return to care.  We also discussed preventing potential complications from long-term prednisone  use as well.  She will continue her Protonix  for GI protection.  We also discussed calcium  and vitamin D  supplementation both her most recent vitamin D  level is at goal.  Recommended routine hand hygiene for infection prevention.  Will defer further management to neurology though she will let us  know if she needs any further assistance.  Essential hypertension At goal today on losartan  50 mg daily and Maxide 75-50 and 37.5-25 on alternating days     Subjective:  HPI:  See assessment / plan for status of chronic conditions.  Patient is here today for hospitalization follow-up.  She went to the emergency room on 07/21/2024 with a constellation of neurologic symptoms including headache and diplopia in the setting of recent elevated sed rate and CRP.  Underwent MRI while in the emergency room which did not show any acute findings however there was concern for temporal arteritis.  Neurology was consulted and she was started on high-dose steroids.  She underwent temporal artery biopsy on 07/23/2024 which was consistent with temporal arteritis.  Her steroids were continued for 50 mg daily for 2 weeks to be followed by 40 mg daily for 2 weeks and was advised to follow-up with neurology for ongoing management for her steroids.  She was discharged home on 07/23/2024.  She saw neurology on  07/27/2024 and was advised to continue with high-dose prednisone  50 mg daily   Discussed the use of AI scribe software for clinical note transcription with the patient, who gave verbal consent to proceed.  History of Present Illness Ashley Dyer Ashley Dyer is a 79 year old female with temporal arteritis who presents for hospitalization follow-up. She is accompanied by [name].  She was hospitalized on July 21, 2024, due to neurologic symptoms including headache and diplopia, with elevated sedimentation rate and C-reactive protein. An MRI showed no acute findings, but there was concern for temporal arteritis. Neurology was consulted, and she was started on high-dose steroids. A temporal artery biopsy on July 23, 2024, confirmed temporal arteritis.  She was discharged on July 23, 2024, with a prescription for prednisone , initially at 50 mg daily for two weeks, then 40 mg daily for two weeks. She followed up with neurology on July 27, 2024, and was advised to continue with high-dose prednisone  at 50 mg daily. Currently, she is on 40 mg daily, taking two 20 mg tablets, and has additional 10 mg tablets for tapering purposes.  She experiences fatigue and balance issues. She has difficulty sleeping at night, requiring naps during the day. She has lost weight, from 168 lbs to 151 lbs, which she finds unusual given the typical side effects of prednisone . She is concerned about potential side effects of prednisone , including its impact on her bones and blood sugar levels.  She is taking Protonix  to prevent stomach ulcers due to prednisone  use. There is a concern about vitamin D  intake due to potential  bone loss from prednisone , but her kidney doctor is cautious about vitamin D  due to her history of kidney stones. Her vitamin D  levels were reportedly good at the last check.  She is vigilant about monitoring for symptoms such as headaches and vision changes, which were her initial symptoms.          Objective:  Physical Exam: BP 120/60   Pulse 77   Temp 97.7 F (36.5 C) (Temporal)   Ht 5' 4.5 (1.638 m)   Wt 152 lb 3.2 oz (69 kg)   SpO2 97%   BMI 25.72 kg/m   Gen: No acute distress, resting comfortably CV: Regular rate and rhythm with no murmurs appreciated Pulm: Normal work of breathing, clear to auscultation bilaterally with no crackles, wheezes, or rhonchi Neuro: Grossly normal, moves all extremities Psych: Normal affect and thought content  Time Spent: 45 minutes of total time was spent on the date of the encounter performing the following actions: chart review prior to seeing the patient including recent hospitalization and visit with specialists, obtaining history, performing a medically necessary exam, counseling on the treatment plan, placing orders, and documenting in our EHR.        Worth HERO. Kennyth, MD 08/13/2024 12:13 PM  "

## 2024-08-13 NOTE — Assessment & Plan Note (Signed)
 I had a lengthy discussion with patient today regarding her recent hospitalization and treatment for her temporal arteritis.  Symptoms have improved significantly and she is now on prednisone  40 mg daily per neurology.  She will follow-up with them again in about 6 weeks but is planning on continuing current dose for now.  We did discuss warning signs and reasons to return to care.  We also discussed preventing potential complications from long-term prednisone  use as well.  She will continue her Protonix  for GI protection.  We also discussed calcium  and vitamin D  supplementation both her most recent vitamin D  level is at goal.  Recommended routine hand hygiene for infection prevention.  Will defer further management to neurology though she will let us  know if she needs any further assistance.

## 2024-08-13 NOTE — Patient Instructions (Signed)
 It was very nice to see you today!  VISIT SUMMARY: You had a follow-up visit after your hospitalization for temporal arteritis. We discussed your current treatment with prednisone  and its side effects, including fatigue, balance issues, and weight loss. We also reviewed your medication plan and upcoming neurology follow-up.  YOUR PLAN: TEMPORAL ARTERITIS: You have been diagnosed with temporal arteritis, confirmed by a biopsy. You are currently taking prednisone  to manage this condition. -Continue taking prednisone  40 mg daily as prescribed. -Monitor for symptoms such as headaches, double vision, and vision changes. -Continue taking Protonix  to prevent stomach ulcers. -Ensure you are getting enough calcium  and vitamin D  to protect your bones. -Monitor your blood sugar levels regularly. -Avoid large gatherings to reduce the risk of infection. -Follow up with neurology in March to discuss tapering off prednisone  and re-evaluate your condition.  Return if symptoms worsen or fail to improve.   Take care, Dr Kennyth  PLEASE NOTE:  If you had any lab tests, please let us  know if you have not heard back within a few days. You may see your results on mychart before we have a chance to review them but we will give you a call once they are reviewed by us .   If we ordered any referrals today, please let us  know if you have not heard from their office within the next week.   If you had any urgent prescriptions sent in today, please check with the pharmacy within an hour of our visit to make sure the prescription was transmitted appropriately.   Please try these tips to maintain a healthy lifestyle:  Eat at least 3 REAL meals and 1-2 snacks per day.  Aim for no more than 5 hours between eating.  If you eat breakfast, please do so within one hour of getting up.   Each meal should contain half fruits/vegetables, one quarter protein, and one quarter carbs (no bigger than a computer mouse)  Cut down  on sweet beverages. This includes juice, soda, and sweet tea.   Drink at least 1 glass of water  with each meal and aim for at least 8 glasses per day  Exercise at least 150 minutes every week.

## 2024-08-13 NOTE — Assessment & Plan Note (Signed)
 At goal today on losartan  50 mg daily and Maxide 75-50 and 37.5-25 on alternating days

## 2024-08-17 ENCOUNTER — Other Ambulatory Visit: Payer: Self-pay | Admitting: Pharmacist

## 2024-08-17 MED ORDER — PRALUENT 150 MG/ML ~~LOC~~ SOAJ: 150.0000 mg | SUBCUTANEOUS | 1 refills | Status: AC

## 2024-08-18 ENCOUNTER — Encounter: Payer: Self-pay | Admitting: Diagnostic Neuroimaging

## 2024-08-18 ENCOUNTER — Ambulatory Visit: Payer: Self-pay | Admitting: Diagnostic Neuroimaging

## 2024-08-18 DIAGNOSIS — R7689 Other specified abnormal immunological findings in serum: Secondary | ICD-10-CM

## 2024-08-18 DIAGNOSIS — M316 Other giant cell arteritis: Secondary | ICD-10-CM

## 2024-08-18 NOTE — Telephone Encounter (Signed)
 Per patient's last office visit with Dr. SHAUNNA on 07/27/24 - continue prednisone  50mg  daily for now; repeat labs; then will plan for slow taper (will give 10mg  tabs for future tapering dose)   - continue omeprazole (stomach protection)   - consider calcium  + vitamin D  (on hold due to history of kidney stones)   Was last filled on 07/23/24 and prescribed by Amin,Aqsa MD ( Pantoprazole  40g)

## 2024-08-19 ENCOUNTER — Encounter: Payer: Self-pay | Admitting: Diagnostic Neuroimaging

## 2024-08-19 ENCOUNTER — Telehealth: Payer: Self-pay | Admitting: Diagnostic Neuroimaging

## 2024-08-19 NOTE — Telephone Encounter (Signed)
 Referral for rheumatology sent through [facility] to [facility]. Phone: [phone], Fax: [phone]

## 2024-09-07 ENCOUNTER — Other Ambulatory Visit: Payer: Self-pay

## 2024-09-10 MED ORDER — LOSARTAN POTASSIUM 50 MG PO TABS: 50.0000 mg | ORAL_TABLET | Freq: Every day | ORAL | 0 refills | Status: AC

## 2024-09-10 NOTE — Telephone Encounter (Signed)
 [facility] to schedule overdue follow up. 30 day supply sent.

## 2024-09-10 NOTE — Progress Notes (Unsigned)
 "  Office Visit Note  Patient: Ashley Dyer             Date of Birth: 12-31-1945           MRN: [id number]             PCP: Kennyth Worth HERO, MD Referring: Margaret Eduard SAUNDERS, MD Visit Date: 09/17/2024 Occupation: Data Unavailable  Subjective:  No chief complaint on file.   History of Present Illness: Ashley Dyer is a 79 y.o. female ***     Activities of Daily Living:  Patient reports morning stiffness for *** {minute/hour:19697}.   Patient {ACTIONS;DENIES/REPORTS:21021675::Denies} nocturnal pain.  Difficulty dressing/grooming: {ACTIONS;DENIES/REPORTS:21021675::Denies} Difficulty climbing stairs: {ACTIONS;DENIES/REPORTS:21021675::Denies} Difficulty getting out of chair: {ACTIONS;DENIES/REPORTS:21021675::Denies} Difficulty using hands for taps, buttons, cutlery, and/or writing: {ACTIONS;DENIES/REPORTS:21021675::Denies}  No Rheumatology ROS completed.   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   Essential hypertension 09/08/2018   Dyslipidemia 09/08/2018   Allergic rhinitis 09/08/2018   Mild intermittent asthma without complication 09/08/2018   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   Walking  pneumonia     Family History  Problem Relation Age of Onset   Atrial fibrillation Mother    Multiple sclerosis Father    Heart disease Father    Diverticulitis Father    Diverticulitis Sister    Kidney disease Sister    Leukemia Brother    Colon cancer Paternal Grandfather    Breast cancer Neg Hx    BRCA 1/2 Neg Hx    Seizures Neg Hx    Stroke Neg Hx    Past Surgical History:  Procedure Laterality Date   ARTERY BIOPSY Left 07/23/2024   Procedure: BIOPSY TEMPORAL ARTERY;  Surgeon: Pearline Norman RAMAN, MD;  Location: [facility] OR;  Service: Vascular;  Laterality: Left;   CATARACT EXTRACTION Bilateral    EYE SURGERY     KIDNEY STONE SURGERY     NEPHROLITHOTOMY Right 10/22/2019   Procedure: RIGHT NEPHROLITHOTOMY PERCUTANEOUS WITH SURGEON ACCESS;  Surgeon: Cam Morene ORN, MD;  Location: [facility] ORS;  Service: Urology;  Laterality: Right;   TONSILLECTOMY     TOTAL HIP ARTHROPLASTY Left 05/11/2019   Procedure: LEFT TOTAL HIP ARTHROPLASTY ANTERIOR APPROACH;  Surgeon: Liam Lerner, MD;  Location: [facility] ORS;  Service: Orthopedics;  Laterality: Left;   TOTAL SHOULDER ARTHROPLASTY Right 06/13/2017   TOTAL SHOULDER ARTHROPLASTY Right 06/13/2017   Procedure: TOTAL SHOULDER ARTHROPLASTY;  Surgeon: Dozier Soulier, MD;  Location: [facility] OR;  Service: Orthopedics;  Laterality: Right;  Right total shoulder arthroplasty   TOTAL SHOULDER ARTHROPLASTY Left 01/22/2019   Procedure: TOTAL SHOULDER ARTHROPLASTY;  Surgeon: Dozier Soulier, MD;  Location: [facility] ORS;  Service: Orthopedics;  Laterality: Left;   VAGINAL DELIVERY  1976   Social History[1] Social History   Social History Narrative   1 mug of coffee daily, no soda      Immunization History  Administered Date(s) Administered   Fluad Quad(high Dose 65+) 05/15/2021   INFLUENZA, HIGH DOSE SEASONAL PF 04/23/2019, 06/14/2023, 05/19/2024   Influenza-Unspecified 04/23/2019   PFIZER(Purple Top)SARS-COV-2 Vaccination 09/20/2019, 10/13/2019, 05/09/2020    Pneumococcal Conjugate-13 10/15/2013   Pneumococcal Polysaccharide-23 01/05/2011   Tdap 02/03/2010, 10/03/2020   Zoster Recombinant(Shingrix) 10/05/2011, 05/23/2018, 08/29/2018   Zoster, Live 10/05/2011   Zoster, Unspecified 08/29/2018     Objective: Vital Signs: There were no vitals taken for this visit.   Physical Exam   Musculoskeletal Exam: ***  CDAI Exam: CDAI Score: -- Patient Global: --; Provider Global: -- Swollen: --; Tender: -- Joint Exam 09/17/2024   No joint exam has been documented for this visit   There is currently no information documented on the homunculus. Go to the Rheumatology activity and complete the homunculus joint exam.  Investigation: No additional findings.  Imaging: No results found.  Recent Labs: Lab Results  Component Value Date   WBC 10.0 07/23/2024   HGB 12.0 07/23/2024   PLT 297 07/23/2024   NA 136 07/23/2024   K 3.6 07/23/2024   CL 99 07/23/2024   CO2 25 07/23/2024   GLUCOSE 97 07/23/2024   BUN 25 (H) 07/23/2024   CREATININE 0.80 07/23/2024   BILITOT 0.7 04/21/2024   ALKPHOS 69 04/21/2024   AST 25 04/21/2024   ALT 16 04/21/2024   PROT 7.3 04/21/2024   ALBUMIN 4.5 04/21/2024   CALCIUM  9.9 07/23/2024   GFRAA 88 08/30/2020    Speciality Comments: No specialty comments available.  Procedures:  No procedures performed Allergies: Lipitor [atorvastatin calcium ]   Assessment / Plan:     Visit Diagnoses: No diagnosis found.  Orders: No orders of the defined types were placed in this encounter.  No orders of the defined types were placed in this encounter.   Face-to-face time spent with patient was *** minutes. Greater than 50% of time was spent in counseling and coordination of care.  Follow-Up Instructions: No follow-ups on file.   Alfonso Patterson, LPN  Note - This record has been created using [employer].  Chart creation errors have been sought, but may not always  have been located. Such creation errors do  not reflect on  the standard of medical care.    [1]  Social History Tobacco Use   Smoking status: Never   Smokeless tobacco: Never  Vaping Use   Vaping status: Never Used  Substance Use Topics   Alcohol use: Yes    Alcohol/week: 8.0 - 10.0 standard drinks of alcohol    Types: 8 - 10 Glasses of wine per week    Comment: week    Drug use: No   "

## 2024-09-17 ENCOUNTER — Ambulatory Visit

## 2024-10-28 ENCOUNTER — Ambulatory Visit: Admitting: Diagnostic Neuroimaging

## 2024-11-04 ENCOUNTER — Ambulatory Visit

## 2025-04-20 ENCOUNTER — Encounter: Admitting: Family Medicine
